# Patient Record
Sex: Female | Born: 1965 | Race: White | Hispanic: No | Marital: Married | Smoking: Current every day smoker
Health system: Southern US, Community
[De-identification: ages and names within clinical notes are randomized; demographics above are authoritative.]

## PROBLEM LIST (undated history)

## (undated) DIAGNOSIS — F419 Anxiety disorder, unspecified: Secondary | ICD-10-CM

## (undated) DIAGNOSIS — M5136 Other intervertebral disc degeneration, lumbar region: Secondary | ICD-10-CM

## (undated) DIAGNOSIS — R011 Cardiac murmur, unspecified: Secondary | ICD-10-CM

## (undated) DIAGNOSIS — F32A Depression, unspecified: Secondary | ICD-10-CM

## (undated) DIAGNOSIS — E7800 Pure hypercholesterolemia, unspecified: Secondary | ICD-10-CM

## (undated) DIAGNOSIS — G8929 Other chronic pain: Secondary | ICD-10-CM

## (undated) DIAGNOSIS — M5030 Other cervical disc degeneration, unspecified cervical region: Secondary | ICD-10-CM

## (undated) DIAGNOSIS — M545 Low back pain, unspecified: Secondary | ICD-10-CM

## (undated) DIAGNOSIS — F329 Major depressive disorder, single episode, unspecified: Secondary | ICD-10-CM

## (undated) DIAGNOSIS — E039 Hypothyroidism, unspecified: Secondary | ICD-10-CM

## (undated) DIAGNOSIS — F319 Bipolar disorder, unspecified: Secondary | ICD-10-CM

## (undated) DIAGNOSIS — M51369 Other intervertebral disc degeneration, lumbar region without mention of lumbar back pain or lower extremity pain: Secondary | ICD-10-CM

## (undated) DIAGNOSIS — I10 Essential (primary) hypertension: Secondary | ICD-10-CM

## (undated) HISTORY — PX: CARPAL TUNNEL RELEASE: SHX101

## (undated) HISTORY — DX: Hypothyroidism, unspecified: E03.9

## (undated) HISTORY — PX: LIPOMA EXCISION: SHX5283

---

## 1997-06-30 ENCOUNTER — Ambulatory Visit (HOSPITAL_COMMUNITY): Admission: RE | Admit: 1997-06-30 | Discharge: 1997-06-30 | Payer: Self-pay | Admitting: Obstetrics & Gynecology

## 1997-09-16 ENCOUNTER — Encounter: Admission: RE | Admit: 1997-09-16 | Discharge: 1997-12-15 | Payer: Self-pay | Admitting: Obstetrics and Gynecology

## 1997-10-14 ENCOUNTER — Other Ambulatory Visit: Admission: RE | Admit: 1997-10-14 | Discharge: 1997-10-14 | Payer: Self-pay | Admitting: Obstetrics and Gynecology

## 1997-10-27 ENCOUNTER — Ambulatory Visit (HOSPITAL_COMMUNITY): Admission: RE | Admit: 1997-10-27 | Discharge: 1997-10-27 | Payer: Self-pay | Admitting: Obstetrics & Gynecology

## 1997-12-01 ENCOUNTER — Inpatient Hospital Stay (HOSPITAL_COMMUNITY): Admission: AD | Admit: 1997-12-01 | Discharge: 1997-12-01 | Payer: Self-pay | Admitting: Obstetrics & Gynecology

## 1997-12-19 ENCOUNTER — Ambulatory Visit (HOSPITAL_COMMUNITY): Admission: RE | Admit: 1997-12-19 | Discharge: 1997-12-19 | Payer: Self-pay | Admitting: Obstetrics and Gynecology

## 1997-12-20 ENCOUNTER — Observation Stay (HOSPITAL_COMMUNITY): Admission: AD | Admit: 1997-12-20 | Discharge: 1997-12-21 | Payer: Self-pay | Admitting: Obstetrics and Gynecology

## 1997-12-26 ENCOUNTER — Inpatient Hospital Stay (HOSPITAL_COMMUNITY): Admission: AD | Admit: 1997-12-26 | Discharge: 1997-12-28 | Payer: Self-pay | Admitting: Obstetrics and Gynecology

## 1998-12-24 ENCOUNTER — Emergency Department (HOSPITAL_COMMUNITY): Admission: EM | Admit: 1998-12-24 | Discharge: 1998-12-24 | Payer: Self-pay | Admitting: Emergency Medicine

## 1998-12-24 ENCOUNTER — Encounter: Payer: Self-pay | Admitting: Emergency Medicine

## 1999-04-12 ENCOUNTER — Encounter: Payer: Self-pay | Admitting: Emergency Medicine

## 1999-04-12 ENCOUNTER — Emergency Department (HOSPITAL_COMMUNITY): Admission: EM | Admit: 1999-04-12 | Discharge: 1999-04-12 | Payer: Self-pay | Admitting: Emergency Medicine

## 1999-04-21 ENCOUNTER — Other Ambulatory Visit: Admission: RE | Admit: 1999-04-21 | Discharge: 1999-05-10 | Payer: Self-pay | Admitting: *Deleted

## 1999-07-06 ENCOUNTER — Ambulatory Visit (HOSPITAL_COMMUNITY): Admission: RE | Admit: 1999-07-06 | Discharge: 1999-07-06 | Payer: Self-pay | Admitting: Family Medicine

## 1999-07-07 ENCOUNTER — Encounter: Payer: Self-pay | Admitting: Family Medicine

## 1999-09-07 ENCOUNTER — Emergency Department (HOSPITAL_COMMUNITY): Admission: EM | Admit: 1999-09-07 | Discharge: 1999-09-07 | Payer: Self-pay | Admitting: Emergency Medicine

## 2000-07-27 ENCOUNTER — Encounter: Admission: RE | Admit: 2000-07-27 | Discharge: 2000-07-27 | Payer: Self-pay | Admitting: Orthopedic Surgery

## 2000-08-22 ENCOUNTER — Encounter: Admission: RE | Admit: 2000-08-22 | Discharge: 2000-08-22 | Payer: Self-pay | Admitting: Family Medicine

## 2000-08-22 ENCOUNTER — Encounter: Payer: Self-pay | Admitting: Family Medicine

## 2000-09-09 ENCOUNTER — Emergency Department (HOSPITAL_COMMUNITY): Admission: EM | Admit: 2000-09-09 | Discharge: 2000-09-09 | Payer: Self-pay | Admitting: Emergency Medicine

## 2000-10-04 ENCOUNTER — Encounter: Admission: RE | Admit: 2000-10-04 | Discharge: 2000-10-17 | Payer: Self-pay | Admitting: Family Medicine

## 2001-01-19 ENCOUNTER — Emergency Department (HOSPITAL_COMMUNITY): Admission: EM | Admit: 2001-01-19 | Discharge: 2001-01-19 | Payer: Self-pay | Admitting: Emergency Medicine

## 2001-06-12 ENCOUNTER — Encounter: Admission: RE | Admit: 2001-06-12 | Discharge: 2001-06-12 | Payer: Self-pay | Admitting: Family Medicine

## 2001-06-12 ENCOUNTER — Encounter: Payer: Self-pay | Admitting: Family Medicine

## 2001-07-16 ENCOUNTER — Encounter: Payer: Self-pay | Admitting: Emergency Medicine

## 2001-07-16 ENCOUNTER — Emergency Department (HOSPITAL_COMMUNITY): Admission: EM | Admit: 2001-07-16 | Discharge: 2001-07-16 | Payer: Self-pay | Admitting: Emergency Medicine

## 2001-10-23 ENCOUNTER — Ambulatory Visit (HOSPITAL_COMMUNITY): Admission: RE | Admit: 2001-10-23 | Discharge: 2001-10-23 | Payer: Self-pay | Admitting: *Deleted

## 2001-10-31 ENCOUNTER — Emergency Department (HOSPITAL_COMMUNITY): Admission: EM | Admit: 2001-10-31 | Discharge: 2001-10-31 | Payer: Self-pay | Admitting: Emergency Medicine

## 2001-10-31 ENCOUNTER — Encounter: Payer: Self-pay | Admitting: Emergency Medicine

## 2002-01-21 ENCOUNTER — Emergency Department (HOSPITAL_COMMUNITY): Admission: EM | Admit: 2002-01-21 | Discharge: 2002-01-22 | Payer: Self-pay

## 2002-01-21 ENCOUNTER — Emergency Department (HOSPITAL_COMMUNITY): Admission: EM | Admit: 2002-01-21 | Discharge: 2002-01-21 | Payer: Self-pay | Admitting: Emergency Medicine

## 2002-01-31 ENCOUNTER — Encounter: Payer: Self-pay | Admitting: Physical Medicine and Rehabilitation

## 2002-01-31 ENCOUNTER — Encounter
Admission: RE | Admit: 2002-01-31 | Discharge: 2002-01-31 | Payer: Self-pay | Admitting: Physical Medicine and Rehabilitation

## 2002-02-12 ENCOUNTER — Encounter (INDEPENDENT_AMBULATORY_CARE_PROVIDER_SITE_OTHER): Payer: Self-pay | Admitting: Cardiology

## 2002-02-12 ENCOUNTER — Ambulatory Visit (HOSPITAL_COMMUNITY): Admission: RE | Admit: 2002-02-12 | Discharge: 2002-02-12 | Payer: Self-pay | Admitting: Family Medicine

## 2002-02-22 ENCOUNTER — Encounter: Payer: Self-pay | Admitting: Physical Medicine and Rehabilitation

## 2002-02-22 ENCOUNTER — Encounter
Admission: RE | Admit: 2002-02-22 | Discharge: 2002-02-22 | Payer: Self-pay | Admitting: Physical Medicine and Rehabilitation

## 2002-09-27 ENCOUNTER — Emergency Department (HOSPITAL_COMMUNITY): Admission: EM | Admit: 2002-09-27 | Discharge: 2002-09-27 | Payer: Self-pay

## 2003-04-09 ENCOUNTER — Other Ambulatory Visit: Admission: RE | Admit: 2003-04-09 | Discharge: 2003-04-09 | Payer: Self-pay | Admitting: Obstetrics and Gynecology

## 2004-01-20 ENCOUNTER — Emergency Department (HOSPITAL_COMMUNITY): Admission: EM | Admit: 2004-01-20 | Discharge: 2004-01-20 | Payer: Self-pay | Admitting: Emergency Medicine

## 2004-09-28 ENCOUNTER — Emergency Department (HOSPITAL_COMMUNITY): Admission: EM | Admit: 2004-09-28 | Discharge: 2004-09-28 | Payer: Self-pay | Admitting: Family Medicine

## 2005-02-11 ENCOUNTER — Encounter: Admission: RE | Admit: 2005-02-11 | Discharge: 2005-02-11 | Payer: Self-pay | Admitting: Family Medicine

## 2006-02-03 ENCOUNTER — Encounter: Admission: RE | Admit: 2006-02-03 | Discharge: 2006-02-03 | Payer: Self-pay | Admitting: Surgery

## 2006-03-03 ENCOUNTER — Encounter: Admission: RE | Admit: 2006-03-03 | Discharge: 2006-03-03 | Payer: Self-pay | Admitting: Surgery

## 2006-03-09 ENCOUNTER — Ambulatory Visit (HOSPITAL_BASED_OUTPATIENT_CLINIC_OR_DEPARTMENT_OTHER): Admission: RE | Admit: 2006-03-09 | Discharge: 2006-03-09 | Payer: Self-pay | Admitting: Surgery

## 2006-03-09 ENCOUNTER — Encounter (INDEPENDENT_AMBULATORY_CARE_PROVIDER_SITE_OTHER): Payer: Self-pay | Admitting: Specialist

## 2006-05-24 ENCOUNTER — Emergency Department (HOSPITAL_COMMUNITY): Admission: EM | Admit: 2006-05-24 | Discharge: 2006-05-24 | Payer: Self-pay | Admitting: Emergency Medicine

## 2007-12-20 ENCOUNTER — Emergency Department (HOSPITAL_COMMUNITY): Admission: EM | Admit: 2007-12-20 | Discharge: 2007-12-20 | Payer: Self-pay | Admitting: Emergency Medicine

## 2007-12-27 ENCOUNTER — Emergency Department (HOSPITAL_COMMUNITY): Admission: EM | Admit: 2007-12-27 | Discharge: 2007-12-27 | Payer: Self-pay | Admitting: Family Medicine

## 2008-01-20 ENCOUNTER — Emergency Department (HOSPITAL_COMMUNITY): Admission: EM | Admit: 2008-01-20 | Discharge: 2008-01-21 | Payer: Self-pay | Admitting: Emergency Medicine

## 2008-04-03 ENCOUNTER — Ambulatory Visit: Payer: Self-pay | Admitting: Cardiology

## 2008-04-03 LAB — CONVERTED CEMR LAB
ALT: 11 units/L (ref 0–35)
Albumin: 4.1 g/dL (ref 3.5–5.2)
Amylase: 51 units/L (ref 27–131)
Lipase: 15 units/L (ref 11.0–59.0)
Total Bilirubin: 0.4 mg/dL (ref 0.3–1.2)

## 2008-04-09 ENCOUNTER — Encounter: Admission: RE | Admit: 2008-04-09 | Discharge: 2008-04-09 | Payer: Self-pay | Admitting: Family Medicine

## 2010-03-13 ENCOUNTER — Emergency Department (HOSPITAL_COMMUNITY): Admission: EM | Admit: 2010-03-13 | Discharge: 2010-03-14 | Payer: Self-pay | Admitting: Emergency Medicine

## 2010-04-26 ENCOUNTER — Emergency Department (HOSPITAL_COMMUNITY)
Admission: EM | Admit: 2010-04-26 | Discharge: 2010-04-26 | Payer: Self-pay | Source: Home / Self Care | Admitting: Emergency Medicine

## 2010-05-09 ENCOUNTER — Emergency Department (HOSPITAL_COMMUNITY)
Admission: EM | Admit: 2010-05-09 | Discharge: 2010-05-09 | Payer: Self-pay | Source: Home / Self Care | Admitting: Emergency Medicine

## 2010-05-17 LAB — POCT CARDIAC MARKERS
CKMB, poc: <1 ng/mL — ABNORMAL LOW (ref 1.0–8.0)
Myoglobin, poc: 53.3 ng/mL (ref 12–200)
Troponin i, poc: <0.05 ng/mL (ref 0.00–0.09)

## 2010-05-17 LAB — CBC
HCT: 34.7 % — ABNORMAL LOW (ref 36.0–46.0)
Hemoglobin: 11.4 g/dL — ABNORMAL LOW (ref 12.0–15.0)
MCH: 30.2 pg (ref 26.0–34.0)
MCHC: 32.9 g/dL (ref 30.0–36.0)
MCV: 91.8 fL (ref 78.0–100.0)
Platelets: 289 10*3/uL (ref 150–400)
RBC: 3.78 MIL/uL — ABNORMAL LOW (ref 3.87–5.11)
RDW: 14 % (ref 11.5–15.5)
WBC: 7.1 10*3/uL (ref 4.0–10.5)

## 2010-05-17 LAB — POCT PREGNANCY, URINE: Preg Test, Ur: NEGATIVE

## 2010-05-17 LAB — URINALYSIS, ROUTINE W REFLEX MICROSCOPIC
Bilirubin Urine: NEGATIVE
Ketones, ur: NEGATIVE mg/dL
Nitrite: NEGATIVE
Protein, ur: NEGATIVE mg/dL
Specific Gravity, Urine: 1.004 — ABNORMAL LOW (ref 1.005–1.030)
Urine Glucose, Fasting: NEGATIVE mg/dL
Urobilinogen, UA: 0.2 mg/dL (ref 0.0–1.0)
pH: 6.5 (ref 5.0–8.0)

## 2010-05-17 LAB — COMPREHENSIVE METABOLIC PANEL
ALT: 14 U/L (ref 0–35)
AST: 19 U/L (ref 0–37)
Albumin: 3.3 g/dL — ABNORMAL LOW (ref 3.5–5.2)
Alkaline Phosphatase: 46 U/L (ref 39–117)
BUN: 7 mg/dL (ref 6–23)
CO2: 27 mEq/L (ref 19–32)
Calcium: 8.3 mg/dL — ABNORMAL LOW (ref 8.4–10.5)
Chloride: 102 mEq/L (ref 96–112)
Creatinine, Ser: 0.7 mg/dL (ref 0.4–1.2)
GFR calc Af Amer: >60 mL/min (ref >60)
GFR calc non Af Amer: >60 mL/min (ref >60)
Glucose, Bld: 108 mg/dL — ABNORMAL HIGH (ref 70–99)
Potassium: 3.5 mEq/L (ref 3.5–5.1)
Sodium: 135 mEq/L (ref 135–145)
Total Bilirubin: 0.3 mg/dL (ref 0.3–1.2)
Total Protein: 6.1 g/dL (ref 6.0–8.3)

## 2010-05-17 LAB — URINE MICROSCOPIC-ADD ON

## 2010-05-17 LAB — DIFFERENTIAL
Basophils Absolute: 0 10*3/uL (ref 0.0–0.1)
Basophils Relative: 0 % (ref 0–1)
Eosinophils Absolute: 0.2 10*3/uL (ref 0.0–0.7)
Eosinophils Relative: 3 % (ref 0–5)
Lymphocytes Relative: 23 % (ref 12–46)
Lymphs Abs: 1.6 10*3/uL (ref 0.7–4.0)
Monocytes Absolute: 0.4 10*3/uL (ref 0.1–1.0)
Monocytes Relative: 6 % (ref 3–12)
Neutro Abs: 4.8 10*3/uL (ref 1.7–7.7)
Neutrophils Relative %: 68 % (ref 43–77)

## 2010-05-17 LAB — LIPASE, BLOOD: Lipase: 14 U/L (ref 11–59)

## 2010-05-23 ENCOUNTER — Encounter: Payer: Self-pay | Admitting: Cardiology

## 2010-07-14 LAB — COMPREHENSIVE METABOLIC PANEL
ALT: 10 U/L (ref 0–35)
AST: 16 U/L (ref 0–37)
Albumin: 3.5 g/dL (ref 3.5–5.2)
CO2: 32 mEq/L (ref 19–32)
Calcium: 8.4 mg/dL (ref 8.4–10.5)
GFR calc Af Amer: >60 mL/min (ref >60)
Sodium: 138 mEq/L (ref 135–145)
Total Protein: 6.6 g/dL (ref 6.0–8.3)

## 2010-07-14 LAB — CBC
Hemoglobin: 11.4 g/dL — ABNORMAL LOW (ref 12.0–15.0)
MCHC: 32.9 g/dL (ref 30.0–36.0)
Platelets: 274 10*3/uL (ref 150–400)
RDW: 13.5 % (ref 11.5–15.5)

## 2010-07-14 LAB — URINALYSIS, ROUTINE W REFLEX MICROSCOPIC
Glucose, UA: NEGATIVE mg/dL
Ketones, ur: NEGATIVE mg/dL
Protein, ur: NEGATIVE mg/dL
pH: 8.5 — ABNORMAL HIGH (ref 5.0–8.0)

## 2010-07-14 LAB — DIFFERENTIAL
Eosinophils Absolute: 0.4 10*3/uL (ref 0.0–0.7)
Eosinophils Relative: 5 % (ref 0–5)
Lymphocytes Relative: 28 % (ref 12–46)
Lymphs Abs: 2 10*3/uL (ref 0.7–4.0)
Monocytes Absolute: 0.6 10*3/uL (ref 0.1–1.0)
Monocytes Relative: 9 % (ref 3–12)

## 2010-07-14 LAB — URINE MICROSCOPIC-ADD ON

## 2010-07-14 LAB — LIPASE, BLOOD: Lipase: 17 U/L (ref 11–59)

## 2010-07-14 LAB — POCT CARDIAC MARKERS
CKMB, poc: 1.7 ng/mL (ref 1.0–8.0)
Myoglobin, poc: 60.2 ng/mL (ref 12–200)

## 2010-09-14 NOTE — Assessment & Plan Note (Signed)
[facility]                            CARDIOLOGY OFFICE NOTE   Jeanne Leach, Jeanne Leach                      MRN:          [id number]  DATE:04/03/2008                            DOB:          December 26, 1965    Jeanne Leach is a 45 year old female with no prior cardiac history whom I  am asked to evaluate for chest pain and hypertension.  Over the past  year, Jeanne Leach has complained of intermittent epigastric pain.  The  pain radiates to her neck.  She describes it as a pressure.  It is not  pleuritic or positional.  It is not exertional.  She typically notices  it more after eating.  It lasts anywhere from 5-20 minutes and resolves  spontaneously.  There is associated nausea and diaphoresis, but there is  no shortness of breath.  These occur every 3-4 weeks and unpredictable.  She apparently was at [facility] visiting a friend approximately 4  months ago and was having the episodes.  She wanted to be seen during  the episodes and apparently was admitted there.  She states that she had  a stress test and a study with dye and was told that her heart was  fine.  She was asked to follow up with her primary care physician.  She  was seen recently by Dr. Daphine Deutscher and we were asked to further evaluate.  Note, she does not have exertional chest pain nor does she have dyspnea  on exertion, orthopnea, PND, pedal edema, palpitations, presyncope, or  syncope.  She has had no pain in the past 3 weeks.   MEDICATIONS:  1. Diovan HCT 160/12.5 mg p.o. daily.  2. Synthroid 150 mcg p.o. daily.  3. Symbyax 12/50 daily.  4. Seroquel 25 mg p.o. b.i.d.  5. Xanax 2 mg daily p.o. nightly.  6. Lasix 20 mg p.o. daily.  7. She also uses albuterol, Percocet, and Flexeril as needed.   ALLERGIES:  She has no known drug allergies.   SOCIAL HISTORY:  She does smoke.  She does not consume alcohol.  She is  married.   FAMILY HISTORY:  Negative for coronary artery disease.   PAST  MEDICAL HISTORY:  Significant for hypertension.  There is no  diabetes mellitus or hyperlipidemia.  She has a history of chronic low  back pain as well as bipolar disorder.  She has a history of  gastroesophageal disease.  She has had a prior C-section and carpal  tunnel surgery.  She had gestational diabetes.  She has a history of  asthma.  She has hypothyroidism after being treated for hyperthyroidism  in the past.  There is also history of anxiety/depression.   REVIEW OF SYSTEMS:  She denies any headaches, fevers, or chills.  There  is no productive cough or hemoptysis.  There is no dysphagia,  odynophagia, melena, or hematochezia.  There is no dysuria or hematuria.  There are no rashes or seizure activity.  There is no orthopnea, PND, or  pedal edema.  She has not had acholic stool.  Remaining of systems are  negative.  PHYSICAL EXAMINATION:  VITAL SIGNS:  Today, shows a blood pressure of  116/80 and pulse is 64.  She weighs 202 pounds.  GENERAL:  She is well developed and somewhat obese.  She is no acute  stress at present.  SKIN:  Warm and dry.  She is not acutely depressed.  There is no  peripheral clubbing.  BACK:  Normal.  HEENT:  Normal with normal eyelids.  NECK:  Supple with a normal upstroke bilaterally.  No bruits noted.  There is no jugular venous distention and no thyromegaly noted.  CHEST:  Clear to auscultation.  There is no expansion.  CARDIOVASCULAR:  Regular rhythm.  Normal S1 and S2.  There is a soft 1/6  systolic ejection murmur at the left sternal border.  There is no S3-S4.  ABDOMEN:  Minimal tenderness in the epigastric area.  There is no  rebound or guarding.  I cannot appreciate hepatosplenomegaly.  There are  no masses palpated.  There is no bruit.  She has 2+ femoral pulses  bilaterally.  No bruits.  EXTREMITIES:  No edema.  Palpated no cords.  She has 2+ dorsalis pedis  pulses bilaterally.  NEUROLOGIC:  Grossly intact.   Her electrocardiogram today  shows a sinus rhythm at a rate of 67.  The  axis is normal.  There are minor nonspecific ST changes.   DIAGNOSES:  1. Chest pain/epigastric pain - etiology of this is unclear to me.      Some of the symptoms sound concerning whereas others are much less      typical.  She has no symptoms with exertion and these typically      occur after eating.  I wonder whether she may have cholelithiasis.      We will schedule to her to have a gallbladder ultrasound as well as      amylase, lipase, and liver functions.  Also note, she had an      extensive workup at [facility] for these symptoms      approximately 4 months ago by her report.  I do not have those      reports available, but she states that her stress test was normal,      and she also had a dye test that was normal.  We will obtain all of      those records from [facility] and review these.  We will have      her return in 2 weeks and we will make further decisions based on      those results plus the gallbladder ultrasound and liver      function/amylase, lipase.  2. Hypertension - she will continue on her present dose of medications      as her blood pressure is well controlled.  3. Hypothyroidism - she will continue her Synthroid.  4. History of bipolar disorder - she will continue her present      medications.  5. Gastroesophageal disease - she does take Prevacid for this.  She      may need a gastrointestinal evaluation in the future if the above      workup is negative.  6. Tobacco abuse - we discussed importance of discontinuing this for      between 3-10 minutes.     Madolyn Frieze Jens Som, MD, [facility]  Electronically Signed    BSC/MedQ  DD: 04/03/2008  DT: 04/04/2008  Job #: [id number]   cc:   Tanya D. Daphine Deutscher, M.D.

## 2010-09-17 NOTE — Op Note (Signed)
NAMEPAHOUA, Jeanne Leach               ACCOUNT NO.:  [id number]   MEDICAL RECORD NO.:  [id number]          PATIENT TYPE:  AMB   LOCATION:  [facility]                         FACILITY:  [facility]   PHYSICIAN:  Thomas A. Cornett, M.D.DATE OF BIRTH:  January 20, 1966   DATE OF PROCEDURE:  03/09/2006  DATE OF DISCHARGE:                                 OPERATIVE REPORT   PREOPERATIVE DIAGNOSIS:  Left chest wall mass.   POSTOPERATIVE DIAGNOSIS:  Left chest wall mass.   PROCEDURE:  Excision of left chest wall mass.   SURGEON:  Maisie Fus A. Cornett, M.D.   ANESTHESIA:  LMA with 20 mL of 0.25% Marcaine plain.   ESTIMATED BLOOD LOSS:  20 mL.   SPECIMEN:  A 3 x 3 x 3 cm well-encapsulated, cleared nodule found just below  the pectoralis minor muscle, sent to pathology for evaluation.   DRAINS:  None.   INDICATIONS FOR PROCEDURE:  The patient is a 40-year female who has had a  longstanding history of a slowly-growing left chest wall mass.  Mammograms  were done about a year ago.  These were negative.  The area grew slowly and  she was referred Dr. Sheran Luz for consultation for this.  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  It was less likely  a sarcoma.  This was freely mobile.  She presents today for excision of  this.   DESCRIPTION OF PROCEDURE:  The patient was brought to the operating room and  placed supine.  After induction of LMA anesthesia, the left upper chest was  prepped and draped in a sterile fashion.  The area was palpated and was  about 4 fingerbreadths below the left clavicle.  A transverse incision was  made.  Dissection was carried down until I encountered the pectoralis major  muscle.  I opened the fascia and then split the fibers bluntly with my  fingers until I got down to the pectoralis minor muscle.  The mass was  easily visible. It was not fixed to any underlying structures and  actually  seemed to be separate from the pectoralis minor muscle.  I scored the  pectoralis minor muscle circumferentially.  We were up against the pectoral  nerves, and this seemed to be a neck area.  I dissected carefully around  this mass trying to preserve some of muscle tissue overlying it.  It was  extremely well encapsulated and did not appear to be a part of the  pectoralis minor muscle but separate from that.  We were able to dissect  with about a centimeter margin around this area.  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  I went ahead  and got a good margin and excised the mass and its entirety.  This was sent  to pathology for evaluation.  The operative bed was dry with good  hemostasis.  A single stitch was placed in the muscle due to some oozing in  the pectoralis minor muscle.  At this point in time, I reapproximated the of  the fibers pectoralis major was 3-0 Vicryl.  Monocryl  4-0 was then used to close the skin edges.  All final counts of sponge,  needle and instruments were found to be correct for this portion of the  case.  Sterile dressings were applied.  All final counts were correct x2.  The patient was taken to recovery in satisfactory condition.      Thomas A. Cornett, M.D.  Electronically Signed     TAC/MEDQ  D:  03/09/2006  T:  03/10/2006  Job:  [id number]   cc:   Caralyn Guile. Ethelene Hal, M.D.  Fax: [phone]

## 2011-07-25 ENCOUNTER — Ambulatory Visit (INDEPENDENT_AMBULATORY_CARE_PROVIDER_SITE_OTHER): Payer: Medicare Other | Admitting: Obstetrics and Gynecology

## 2011-07-25 DIAGNOSIS — N920 Excessive and frequent menstruation with regular cycle: Secondary | ICD-10-CM

## 2011-07-25 DIAGNOSIS — N949 Unspecified condition associated with female genital organs and menstrual cycle: Secondary | ICD-10-CM

## 2011-08-09 ENCOUNTER — Telehealth: Payer: Self-pay | Admitting: Obstetrics and Gynecology

## 2011-08-15 ENCOUNTER — Telehealth: Payer: Self-pay | Admitting: Obstetrics and Gynecology

## 2011-08-15 NOTE — Telephone Encounter (Signed)
Lm on pt's vm to call back rgd msg. bt cma  

## 2011-08-15 NOTE — Telephone Encounter (Signed)
Spoke with pt rgd msg. Pt stated that she wanted something for pain. Per avs approved rx for ultram 50mg  1 to 2 po prn for pain dip # 30 with 1 refill. Called into [place] [phone]. Pt aware of rx and voice understanding. bt cma

## 2011-09-02 ENCOUNTER — Other Ambulatory Visit: Payer: Self-pay | Admitting: Obstetrics and Gynecology

## 2011-09-02 DIAGNOSIS — N8500 Endometrial hyperplasia, unspecified: Secondary | ICD-10-CM

## 2011-09-05 ENCOUNTER — Encounter: Payer: Medicare Other | Admitting: Obstetrics and Gynecology

## 2011-09-05 ENCOUNTER — Ambulatory Visit: Payer: Medicare Other

## 2011-09-05 ENCOUNTER — Other Ambulatory Visit: Payer: Medicare Other

## 2011-11-24 ENCOUNTER — Emergency Department (HOSPITAL_COMMUNITY): Payer: Medicare Other

## 2011-11-24 ENCOUNTER — Emergency Department (HOSPITAL_COMMUNITY)
Admission: EM | Admit: 2011-11-24 | Discharge: 2011-11-24 | Disposition: A | Discharge status: Home / Self Care | Payer: Medicare Other | Attending: Emergency Medicine | Admitting: Emergency Medicine

## 2011-11-24 ENCOUNTER — Encounter (HOSPITAL_COMMUNITY): Payer: Self-pay | Admitting: *Deleted

## 2011-11-24 DIAGNOSIS — R51 Headache: Secondary | ICD-10-CM | POA: Insufficient documentation

## 2011-11-24 DIAGNOSIS — K089 Disorder of teeth and supporting structures, unspecified: Secondary | ICD-10-CM | POA: Insufficient documentation

## 2011-11-24 DIAGNOSIS — I10 Essential (primary) hypertension: Secondary | ICD-10-CM | POA: Insufficient documentation

## 2011-11-24 DIAGNOSIS — M542 Cervicalgia: Secondary | ICD-10-CM | POA: Insufficient documentation

## 2011-11-24 DIAGNOSIS — H9209 Otalgia, unspecified ear: Secondary | ICD-10-CM | POA: Insufficient documentation

## 2011-11-24 DIAGNOSIS — E119 Type 2 diabetes mellitus without complications: Secondary | ICD-10-CM | POA: Insufficient documentation

## 2011-11-24 DIAGNOSIS — Z79899 Other long term (current) drug therapy: Secondary | ICD-10-CM | POA: Insufficient documentation

## 2011-11-24 HISTORY — DX: Essential (primary) hypertension: I10

## 2011-11-24 MED ORDER — ONDANSETRON HCL 4 MG/2ML IJ SOLN
4.0000 mg | Freq: Once | INTRAMUSCULAR | Status: AC
  Administered 2011-11-24: 4 mg via INTRAVENOUS
  Filled 2011-11-24: qty 2

## 2011-11-24 MED ORDER — FENTANYL CITRATE 0.05 MG/ML IJ SOLN
100.0000 ug | Freq: Once | INTRAMUSCULAR | Status: AC
  Administered 2011-11-24: 100 ug via INTRAVENOUS
  Filled 2011-11-24: qty 2

## 2011-11-24 MED ORDER — OXYCODONE-ACETAMINOPHEN 7.5-325 MG PO TABS: 1.0000 | ORAL_TABLET | ORAL | Status: AC | PRN

## 2011-11-24 MED ORDER — CYCLOBENZAPRINE HCL 10 MG PO TABS: 10.0000 mg | ORAL_TABLET | Freq: Two times a day (BID) | ORAL | Status: AC | PRN

## 2011-11-24 MED ORDER — CLONIDINE HCL 0.1 MG PO TABS
0.1000 mg | ORAL_TABLET | Freq: Once | ORAL | Status: DC
  Filled 2011-11-24: qty 1

## 2011-11-24 MED ORDER — SODIUM CHLORIDE 0.9 % IV SOLN
Freq: Once | INTRAVENOUS | Status: AC
  Administered 2011-11-24: 22:00:00 via INTRAVENOUS

## 2011-11-24 MED ORDER — OXYCODONE-ACETAMINOPHEN 5-325 MG PO TABS
2.0000 | ORAL_TABLET | Freq: Once | ORAL | Status: AC
  Administered 2011-11-24: 2 via ORAL
  Filled 2011-11-24: qty 2

## 2011-11-24 NOTE — ED Notes (Signed)
Pt is back in room from radiology 

## 2011-11-24 NOTE — ED Notes (Signed)
Patient was sitting outside the ED and did not hear her name called.  I apologized to patient, patient transferred immediately to an exam room.

## 2011-11-24 NOTE — ED Notes (Signed)
Headache and lt sided face and head pain with a stiff neck for 4 days.  No known injury

## 2011-11-24 NOTE — ED Provider Notes (Addendum)
History     CSN: [id number]  Arrival date & time 11/24/11  1556   First MD Initiated Contact with Patient 11/24/11 2006      Chief Complaint  Patient presents with  . Neck Pain    left posterior neck pain  . Otalgia    left ear pain     ) HPI Patient with left-sided headache which started yesterday.  This morning woke up with left-sided neck pain.  Denies fever chills.  Pain is worse when she moves her neck especially turning to the left.  Patient also has hypertension here in the emergency department.  She states she took her medication for high blood pressure this morning is normal.  Patient's had no vomiting.  Patient has no previous history of migraines.  Has had no ear drainage.  Patient also complains of a toothache which is being treated for by her dentist. Past Medical History  Diagnosis Date  . Hypertension   . Diabetes mellitus     History reviewed. No pertinent past surgical history.  No family history on file.  History  Substance Use Topics  . Smoking status: Current Everyday Smoker  . Smokeless tobacco: Not on file  . Alcohol Use: No    OB History    Grav Para Term Preterm Abortions TAB SAB Ect Mult Living                  Review of Systems  All other systems reviewed and are negative.    Allergies  Review of patient's allergies indicates no known allergies.  Home Medications   Current Outpatient Rx  Name Route Sig Dispense Refill  . ALPRAZOLAM 2 MG PO TABS Oral Take 2 mg by mouth 4 (four) times daily.    Marland Kitchen OLANZAPINE-FLUOXETINE HCL 6-50 MG PO CAPS Oral Take 1 capsule by mouth every evening.    Marland Kitchen VALSARTAN 160 MG PO TABS Oral Take 160 mg by mouth daily.    . CYCLOBENZAPRINE HCL 10 MG PO TABS Oral Take 1 tablet (10 mg total) by mouth 2 (two) times daily as needed for muscle spasms. 20 tablet 0  . OXYCODONE-ACETAMINOPHEN 7.5-325 MG PO TABS Oral Take 1 tablet by mouth every 4 (four) hours as needed for pain. 30 tablet 0    BP 138/99  Pulse 85   Temp 99.6 F (37.6 C) (Oral)  Resp 18  SpO2 98%  LMP 11/10/2011  Physical Exam  Nursing note and vitals reviewed. Constitutional: She is oriented to person, place, and time. She appears well-developed. No distress.  HENT:  Head: Normocephalic and atraumatic.  Right Ear: Tympanic membrane and external ear normal. No drainage, swelling or tenderness.  Left Ear: Tympanic membrane and external ear normal. No drainage, swelling or tenderness.  Eyes: Pupils are equal, round, and reactive to light.  Neck: Normal range of motion.    Cardiovascular: Normal rate and intact distal pulses.   Pulmonary/Chest: No respiratory distress.  Abdominal: Normal appearance. She exhibits no distension.  Musculoskeletal: Normal range of motion.  Neurological: She is alert and oriented to person, place, and time. No cranial nerve deficit.  Skin: Skin is warm and dry. No rash noted.  Psychiatric: She has a normal mood and affect. Her behavior is normal.    ED Course  Procedures (including critical care time)  Scheduled Meds:   . sodium chloride   Intravenous Once  . cloNIDine  0.1 mg Oral Once  . fentaNYL  100 mcg Intravenous Once  . fentaNYL  100 mcg Intravenous Once  . ondansetron  4 mg Intravenous Once  . oxyCODONE-acetaminophen  2 tablet Oral Once  . DISCONTD: cloNIDine  0.1 mg Oral Once   Continuous Infusions:  PRN Meds:.  Labs Reviewed - No data to display Ct Head Wo Contrast  11/24/2011  **ADDENDUM** CREATED: 11/24/2011 22:45:46  There was an error in the Auto-Text insertion of the original dictation.  The addendum represents the accurate final interpretation of the study.  *RADIOLOGY REPORT*  Clinical Data:  Posterior headache and hypertension.  CT HEAD WITHOUT CONTRAST  Technique:  Contiguous axial images were obtained from the base of the skull through the vertex without contrast  Comparison:  None.  Findings:  The brain has a normal appearance without evidence for hemorrhage, acute  infarction, hydrocephalus, or mass lesion.  There is no extra axial fluid collection.  The skull and paranasal sinuses are normal.  IMPRESSION: Normal CT of the head without contrast.  **END ADDENDUM** SIGNED BY: Sherrine Maples T. Fredia Sorrow, M.D.   11/24/2011  *RADIOLOGY REPORT*  Clinical Data: Posterior headache and hypertension.  CT HEAD WITHOUT CONTRAST  Comparison:  01/21/2008  Findings:  There is no evidence of fracture or dislocation.  There is no evidence of arthropathy or other focal bone abnormality. Soft tissues are unremarkable.  IMPRESSION: Negative.  Original Report Authenticated By: Reola Calkins, M.D.     1. Cervical pain       MDM  After treatment in the ED the patient feels better and wants to go home.         Nelia Shi, MD 11/24/11 [id number]  Nelia Shi, MD 11/25/11 [phone]

## 2011-11-24 NOTE — ED Notes (Signed)
Pt states she started having left sided posterior neck pain, left ear pain, and pain in the back of her head for two days. pt states she took two vicodin today with mild relieve, pt states pain level is at a 10

## 2011-11-24 NOTE — ED Notes (Signed)
Reassesed pt pain/ pt states pain is at a 5

## 2011-11-24 NOTE — ED Notes (Signed)
Pt is currently in radiology. 

## 2011-12-06 ENCOUNTER — Encounter (HOSPITAL_COMMUNITY): Payer: Self-pay | Admitting: Emergency Medicine

## 2011-12-06 ENCOUNTER — Emergency Department (INDEPENDENT_AMBULATORY_CARE_PROVIDER_SITE_OTHER)
Admission: EM | Admit: 2011-12-06 | Discharge: 2011-12-06 | Disposition: A | Discharge status: Other Acute Inpatient Hospital | Payer: Medicare Other | Source: Home / Self Care | Attending: Emergency Medicine | Admitting: Emergency Medicine

## 2011-12-06 ENCOUNTER — Emergency Department (HOSPITAL_COMMUNITY)
Admission: EM | Admit: 2011-12-06 | Discharge: 2011-12-07 | Disposition: A | Discharge status: Home / Self Care | Payer: Medicare Other | Attending: Emergency Medicine | Admitting: Emergency Medicine

## 2011-12-06 ENCOUNTER — Encounter (HOSPITAL_COMMUNITY): Payer: Self-pay

## 2011-12-06 DIAGNOSIS — M436 Torticollis: Secondary | ICD-10-CM

## 2011-12-06 DIAGNOSIS — F17200 Nicotine dependence, unspecified, uncomplicated: Secondary | ICD-10-CM | POA: Insufficient documentation

## 2011-12-06 DIAGNOSIS — M5030 Other cervical disc degeneration, unspecified cervical region: Secondary | ICD-10-CM | POA: Insufficient documentation

## 2011-12-06 DIAGNOSIS — I10 Essential (primary) hypertension: Secondary | ICD-10-CM

## 2011-12-06 DIAGNOSIS — M542 Cervicalgia: Secondary | ICD-10-CM

## 2011-12-06 DIAGNOSIS — F319 Bipolar disorder, unspecified: Secondary | ICD-10-CM | POA: Insufficient documentation

## 2011-12-06 DIAGNOSIS — M51379 Other intervertebral disc degeneration, lumbosacral region without mention of lumbar back pain or lower extremity pain: Secondary | ICD-10-CM | POA: Insufficient documentation

## 2011-12-06 DIAGNOSIS — M5137 Other intervertebral disc degeneration, lumbosacral region: Secondary | ICD-10-CM | POA: Insufficient documentation

## 2011-12-06 HISTORY — DX: Other intervertebral disc degeneration, lumbar region without mention of lumbar back pain or lower extremity pain: M51.369

## 2011-12-06 HISTORY — DX: Other intervertebral disc degeneration, lumbar region: M51.36

## 2011-12-06 HISTORY — DX: Bipolar disorder, unspecified: F31.9

## 2011-12-06 HISTORY — DX: Other cervical disc degeneration, unspecified cervical region: M50.30

## 2011-12-06 MED ORDER — CLONIDINE HCL 0.1 MG PO TABS: 0.1000 mg | ORAL_TABLET | Freq: Every day | ORAL | Status: DC
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
## 2011-12-06 NOTE — ED Notes (Signed)
Neck pain for several weeks she has been seen here once for the same.  She ran out of pain med so she sent to ucc and was transferred down.  C/o pain she has called x2 since she came to the treatment room.  She has been there for 10 minutes

## 2011-12-06 NOTE — ED Notes (Signed)
Seen 7-25 in ED for pain in neck, no trauma; out of pain meds, continuing to have symptoms of pain and neck stiffness

## 2011-12-06 NOTE — ED Notes (Signed)
The pt and family have been making threats to the staff while i was out of the dept.  Upset over the wait and the pt has not been given pain med  Has not been seen by a doctor

## 2011-12-06 NOTE — ED Notes (Addendum)
PT. REPORTS PERSISTENT PAIN AT BACK OF NECK , LEFT EAR ACHE , AND PAIN AT BACK OF HEAD FOR SEVERAL WEEKS , SEEN HERE LAST July 25 , PRESCRIBED WITH PERCOCET AND FLEXERIL FOR NECK STRAIN WITH NO RELIEF, DENIES FEVER OR CHILLS. NO RECENT INJURY.

## 2011-12-06 NOTE — ED Notes (Signed)
HYPERTENSIVE AT TRIAGE.

## 2011-12-06 NOTE — ED Notes (Signed)
Pt updated on delays and wait time 

## 2011-12-06 NOTE — ED Notes (Signed)
Family member requesting to speak to the Magazine features editor.  Barbara Cower here he will speak to pt and family

## 2011-12-07 ENCOUNTER — Emergency Department (HOSPITAL_COMMUNITY): Payer: Medicare Other

## 2011-12-07 MED ORDER — PREDNISONE 10 MG PO TABS: 20.0000 mg | ORAL_TABLET | Freq: Two times a day (BID) | ORAL | Status: DC

## 2011-12-07 MED ORDER — OXYCODONE-ACETAMINOPHEN 5-325 MG PO TABS: 1.0000 | ORAL_TABLET | ORAL | Status: AC | PRN

## 2011-12-07 MED ORDER — HYDROMORPHONE HCL PF 1 MG/ML IJ SOLN
1.0000 mg | Freq: Once | INTRAMUSCULAR | Status: AC
  Administered 2011-12-07: 1 mg via INTRAVENOUS
  Filled 2011-12-07: qty 1

## 2011-12-07 MED ORDER — DIAZEPAM 5 MG/ML IJ SOLN
5.0000 mg | Freq: Once | INTRAMUSCULAR | Status: AC
  Administered 2011-12-07: 5 mg via INTRAVENOUS
  Filled 2011-12-07: qty 2

## 2011-12-07 MED ORDER — DIAZEPAM 10 MG PO TABS: 10.0000 mg | ORAL_TABLET | Freq: Four times a day (QID) | ORAL | Status: AC | PRN

## 2011-12-07 MED ORDER — PREDNISONE 20 MG PO TABS
60.0000 mg | ORAL_TABLET | Freq: Once | ORAL | Status: AC
  Administered 2011-12-07: 60 mg via ORAL
  Filled 2011-12-07: qty 3

## 2011-12-07 NOTE — ED Provider Notes (Signed)
Jeanne Leach is a 46 y.o. female who complains of ongoing, left neck pain, worse with movement for 2 weeks. She was evaluated here 11/24/2011 had a head scan. That was negative for intracranial bleeding or tumor. She takes pain medicines frequently. Her pain medicine doctor weaned her off Percocet about 2 weeks ago. She sees her primary care Dr. regularly, but he does not prescribe her pain medications. On exam, she is alert, anxious, but cooperative. Left neck is tender to palpation, and has decreased left lateral bending, secondary to pain. There is no midline tenderness of the posterior cervical spine. She is neurovascularly intact distally in the arms, and legs. Mouth has poor dentition, no trismus. No cervical adenopathy.  MDM: Chronic pain, with recent onset of neck pain. Suspect musculoskeletal cause of pain. Doubt spinal stenosis, spinal myelopathy, hypertensive urgency, discitis, or occult infection.  Plan: Initiate treatment with IV, Dilaudid, and IV, Valium , and oral prednisone. Home medications, same, orally. Followup PCP as soon as possible.   Flint Melter, MD 12/09/11 [phone]

## 2011-12-07 NOTE — ED Notes (Signed)
Dr Effie Shy in to see the pt.  She wants her saline lok removed.  She is threatening to leave if she does not get med

## 2011-12-07 NOTE — ED Notes (Signed)
The pt did not get her c-t scan.  She was brought back because she  Says she cannot lley her head backward

## 2011-12-07 NOTE — ED Notes (Signed)
Pt being taken to c-t,  She at first refused the c-t but the pa talked to her and she decided to go  For the scan

## 2011-12-07 NOTE — ED Notes (Signed)
The pts pain is not very much better

## 2011-12-07 NOTE — ED Provider Notes (Signed)
History     CSN: [id number]  Arrival date & time 12/06/11  4098   First MD Initiated Contact with Patient 12/06/11 2233      Chief Complaint  Patient presents with  . Neck Pain    (Consider location/radiation/quality/duration/timing/severity/associated sxs/prior treatment) HPI Comments: Jeanne Leach 46 y.o. female   The chief complaint is: Patient presents with:   Neck Pain   The patient has medical history significant for:   Past Medical History:   Hypertension                                                 Diabetes mellitus                                              Comment:gestational only   Degenerative disc disease, cervical                          Degenerative disc disease, lumbar                            Bipolar 1 disorder                                           The onset of the symptoms was 2 weeks ago. c/o pain in the left posterior neck, inability to move neck due to pain and pain in left ear.  Patient was given percocet and flexaril in ED two weeks ago which helped her. Patient went to [facility] urgent care today and was sent over to be evaluated for possible epidural abscess from poor dentition due to persistent c/o neck pain. Patient states that her pain came back after she ran out of pain meds.She denies heat, redness, sweliing, tinitus, jaw pain . She denies Headache, visual disturbance. Stiff neck, or photophobia. She denies numbness and tingling in her hands. No pain down her arms. Denies constitutional symptoms, denies CP or SOB.        Patient is a 46 y.o. female presenting with neck pain. The history is provided by the patient and a relative. No language interpreter was used.  Neck Pain  Pertinent negatives include no chest pain and no headaches.    Past Medical History  Diagnosis Date  . Hypertension   . Diabetes mellitus     gestational only  . Degenerative disc disease, cervical   . Degenerative disc disease, lumbar   . Bipolar 1  disorder     Past Surgical History  Procedure Date  . Cesarean section   . Lipoma excision     No family history on file.  History  Substance Use Topics  . Smoking status: Current Everyday Smoker  . Smokeless tobacco: Not on file  . Alcohol Use: No    OB History    Grav Para Term Preterm Abortions TAB SAB Ect Mult Living                  Review of Systems  Constitutional: Negative for fever and chills.  HENT: Positive for  neck pain and dental problem. Negative for hearing loss, sore throat, facial swelling, mouth sores, trouble swallowing and tinnitus.   Respiratory: Negative for cough and shortness of breath.   Cardiovascular: Negative for chest pain.  Gastrointestinal: Negative for nausea, vomiting, abdominal pain and diarrhea.  Musculoskeletal: Positive for back pain (chronic). Negative for joint swelling.  Skin: Negative for rash and wound.  Neurological: Negative for dizziness and headaches.  All other systems reviewed and are negative.    Allergies  Review of patient's allergies indicates no known allergies.  Home Medications   Current Outpatient Rx  Name Route Sig Dispense Refill  . ALPRAZOLAM 2 MG PO TABS Oral Take 2 mg by mouth 4 (four) times daily.    . FUROSEMIDE 20 MG PO TABS Oral Take 20 mg by mouth daily.    . IBUPROFEN 400 MG PO TABS Oral Take 400 mg by mouth every 6 (six) hours as needed. For pain    . OLANZAPINE-FLUOXETINE HCL 6-50 MG PO CAPS Oral Take 1 capsule by mouth every evening.    . OXYCODONE-ACETAMINOPHEN 10-325 MG PO TABS Oral Take 1 tablet by mouth every 4 (four) hours as needed. For pain    . VALSARTAN 160 MG PO TABS Oral Take 160 mg by mouth daily.      BP 193/115  Pulse 79  Temp 98.5 F (36.9 C) (Oral)  Resp 14  SpO2 100%  LMP 11/10/2011  Physical Exam  Nursing note and vitals reviewed. Constitutional: She is oriented to person, place, and time. She appears well-developed and well-nourished. No distress.       Room smells  of cigarettes.    HENT:  Head: Normocephalic and atraumatic.  Right Ear: Tympanic membrane normal.  Left Ear: Tympanic membrane normal.  Eyes: Conjunctivae are normal. No scleral icterus.  Neck: Normal range of motion.  Cardiovascular: Normal rate, regular rhythm and normal heart sounds.  Exam reveals no gallop and no friction rub.   No murmur heard. Pulmonary/Chest: Effort normal and breath sounds normal. No respiratory distress.  Abdominal: Soft. Bowel sounds are normal. She exhibits no distension and no mass. There is no tenderness. There is no guarding.  Musculoskeletal: She exhibits no edema.       Patient ttp of the left occiput and paraspinal. Refuses to move neck due to pain. No LAD, no swelling, redness, or other signs of infection  Neurological: She is alert and oriented to person, place, and time. No cranial nerve deficit.       Strength 5/5 in arms BL. Sensation grossly intact.  Skin: Skin is warm and dry. She is not diaphoretic.    ED Course  Procedures (including critical care time)  Labs Reviewed - No data to display No results found.  12:07 AM BP 193/115  Pulse 79  Temp 98.5 F (36.9 C) (Oral)  Resp 14  SpO2 100%  LMP 07/11/2013Patient is complaining of pain. I feel that it is likely Musculokeletal considering her history of  Cervical DDD and no known MOI. Urgent care physician's note states that patient recieved 320 percocet last month, and I feel that this may be malingering for pain medication, therefor I am withholding narcotics at this time until evaluation by Dr. Effie Shy. I addressed pain medication with the patient who denies this and states that some one else must have been getting them in her name.  Patient with elevated pressure. She states that she is seeing spots. Will send for Head CT to r/o intracranial bleeds/ hypertensive  urgency.   12:44 AM Patient has refused head CT stating that she had one 2 weeks ago, that she is claustrophobic and that she came  here to be treated for her pain. Of note, the patient had asked for an MRI of her neck upon the recommendation of the urgent care physician she saw earlier today. I addressed this with the patient and explained that the CT was open.  Patient still refused. Patient care  Was then turned over to Dr. Gray Bernhardt as patient was unsatisfied with my treatment  1. Neck pain       MDM  Patient care assumed by Dr. Effie Shy.          Arthor Captain, PA-C 12/07/11 1040

## 2011-12-09 NOTE — ED Provider Notes (Signed)
Medical screening examination/treatment/procedure(s) were conducted as a shared visit with non-physician practitioner(s) and myself.  I personally evaluated the patient during the encounter  Flint Melter, MD 12/09/11 [phone]

## 2011-12-24 ENCOUNTER — Encounter (HOSPITAL_COMMUNITY): Payer: Self-pay | Admitting: *Deleted

## 2011-12-24 ENCOUNTER — Emergency Department (HOSPITAL_COMMUNITY)
Admission: EM | Admit: 2011-12-24 | Discharge: 2011-12-24 | Disposition: A | Discharge status: Home / Self Care | Payer: Medicare Other | Attending: Emergency Medicine | Admitting: Emergency Medicine

## 2011-12-24 DIAGNOSIS — M542 Cervicalgia: Secondary | ICD-10-CM

## 2011-12-24 DIAGNOSIS — G8929 Other chronic pain: Secondary | ICD-10-CM | POA: Insufficient documentation

## 2011-12-24 DIAGNOSIS — M5030 Other cervical disc degeneration, unspecified cervical region: Secondary | ICD-10-CM | POA: Insufficient documentation

## 2011-12-24 DIAGNOSIS — I10 Essential (primary) hypertension: Secondary | ICD-10-CM | POA: Insufficient documentation

## 2011-12-24 DIAGNOSIS — M51379 Other intervertebral disc degeneration, lumbosacral region without mention of lumbar back pain or lower extremity pain: Secondary | ICD-10-CM | POA: Insufficient documentation

## 2011-12-24 DIAGNOSIS — F17200 Nicotine dependence, unspecified, uncomplicated: Secondary | ICD-10-CM | POA: Insufficient documentation

## 2011-12-24 DIAGNOSIS — F319 Bipolar disorder, unspecified: Secondary | ICD-10-CM | POA: Insufficient documentation

## 2011-12-24 DIAGNOSIS — M5137 Other intervertebral disc degeneration, lumbosacral region: Secondary | ICD-10-CM | POA: Insufficient documentation

## 2011-12-24 MED ORDER — CYCLOBENZAPRINE HCL 10 MG PO TABS
10.0000 mg | ORAL_TABLET | Freq: Once | ORAL | Status: AC
  Administered 2011-12-24: 10 mg via ORAL
  Filled 2011-12-24: qty 1

## 2011-12-24 MED ORDER — DIAZEPAM 5 MG PO TABS: 5.0000 mg | ORAL_TABLET | Freq: Four times a day (QID) | ORAL | Status: AC | PRN

## 2011-12-24 MED ORDER — OXYCODONE-ACETAMINOPHEN 5-325 MG PO TABS
2.0000 | ORAL_TABLET | Freq: Once | ORAL | Status: AC
  Administered 2011-12-24: 2 via ORAL
  Filled 2011-12-24: qty 2

## 2011-12-24 NOTE — ED Notes (Signed)
Pt crying,  States she has been sleeping on the floor since her mother had surgery 3 days ago.  States she has pain everywhere c/o neck for the past 3 weeks, lower back pain which she normally takes percocet for but has not had any for several days.   Pain rated 9/10

## 2011-12-24 NOTE — ED Notes (Signed)
Pt states she has been sleeping upstairs as a visitor.  C/o HA, neck pain, and back pain.  Denies CP, SOB.

## 2011-12-24 NOTE — ED Provider Notes (Signed)
History     CSN: [id number]  Arrival date & time 12/24/11  0455   First MD Initiated Contact with Patient 12/24/11 [phone]      Chief Complaint  Patient presents with  . Headache  . Back Pain    (Consider location/radiation/quality/duration/timing/severity/associated sxs/prior treatment) HPI Comments: 46 y/o female with chronic neck, back and hip pain presents with worsening left sided neck pain over the past 4 days since sleeping in the hospital as a visitor for her mom. States she has an appt with a new pain management physician Dr. Verlan Friends on Thursday. Previous physician Dr. Ethelene Hal is no longer doing pain management. Pain has been managed with percocet. She was seen in ED on 8/6 for neck pain after feeling a "tinge" in her neck. prior to that she was in ED on 7/25 for neck pain and headache, had CT scan which was normal. Neck pain worse with crying and feeling stressed. Gets a headache when she cries. Rates pain 9/10. Denies any pain, numbness or tingling down extremities. Denies any fever or chills, visual disturbance. Ibuprofen provides no relief of her pain. Her mom is being discharged soon so she will not be sleeping in the hospital for much longer.  Patient is a 46 y.o. female presenting with headaches and back pain. The history is provided by the patient.  Headache  Pertinent negatives include no fever.  Back Pain  Associated symptoms include headaches. Pertinent negatives include no fever and no numbness.    Past Medical History  Diagnosis Date  . Hypertension   . Diabetes mellitus     gestational only  . Degenerative disc disease, cervical   . Degenerative disc disease, lumbar   . Bipolar 1 disorder     Past Surgical History  Procedure Date  . Cesarean section   . Lipoma excision     History reviewed. No pertinent family history.  History  Substance Use Topics  . Smoking status: Current Everyday Smoker -- 0.5 packs/day  . Smokeless tobacco: Not on file  . Alcohol  Use: No    OB History    Grav Para Term Preterm Abortions TAB SAB Ect Mult Living                  Review of Systems  Constitutional: Negative for fever and chills.  HENT: Positive for neck pain.   Eyes: Negative for visual disturbance.  Musculoskeletal: Positive for back pain.  Neurological: Positive for headaches. Negative for numbness.    Allergies  Review of patient's allergies indicates no known allergies.  Home Medications   Current Outpatient Rx  Name Route Sig Dispense Refill  . ALPRAZOLAM 2 MG PO TABS Oral Take 2 mg by mouth 4 (four) times daily.    . FUROSEMIDE 20 MG PO TABS Oral Take 20 mg by mouth daily.    . IBUPROFEN 400 MG PO TABS Oral Take 400 mg by mouth every 6 (six) hours as needed. For pain    . OLANZAPINE-FLUOXETINE HCL 6-50 MG PO CAPS Oral Take 1 capsule by mouth every evening.    . OXYCODONE-ACETAMINOPHEN 10-325 MG PO TABS Oral Take 1 tablet by mouth every 4 (four) hours as needed. For pain    . VALSARTAN 160 MG PO TABS Oral Take 160 mg by mouth daily.      BP 173/112  Temp 98.2 F (36.8 C) (Oral)  Resp 18  SpO2 100%  Physical Exam  Constitutional: She is oriented to person, place, and time. She  appears well-developed and well-nourished. No distress.  HENT:  Head: Normocephalic and atraumatic.  Eyes: Conjunctivae and EOM are normal. Pupils are equal, round, and reactive to light.  Neck: Neck supple. Muscular tenderness (left paraspinal muscles and trapezius) present. No spinous process tenderness present.       Full cervical ROM, but slow due to pain  Cardiovascular: Normal rate, regular rhythm, normal heart sounds and intact distal pulses.   Pulmonary/Chest: Effort normal and breath sounds normal.  Neurological: She is alert and oriented to person, place, and time. She has normal strength and normal reflexes. No sensory deficit.  Skin: Skin is warm and dry.  Psychiatric: Her speech is normal and behavior is normal. She exhibits a depressed  mood.    ED Course  Procedures (including critical care time)  Labs Reviewed - No data to display No results found.   1. Neck pain   2. Chronic pain       MDM  46 y/o female with chronic neck pain worsening since sleeping in the hospital. States she does not have a car here and is not driving. Asked for the "packets" of percocet given in the ED for her to take upstairs to her moms room to use while she is still here with her mom so she does not have to go to a pharmacy to fill a prescription. Explained we gave her pain medicine in the ED but cannot just give her packets of narcotics. Will discharge with valium as muscle relaxer. F/u with pain management on Thursday.       Trevor Mace, PA-C 12/24/11 [phone]

## 2011-12-29 NOTE — ED Provider Notes (Signed)
Medical screening examination/treatment/procedure(s) were performed by non-physician practitioner and as supervising physician I was immediately available for consultation/collaboration.  Donnetta Hutching, MD 12/29/11 1743

## 2012-01-09 ENCOUNTER — Encounter (HOSPITAL_COMMUNITY): Payer: Self-pay | Admitting: Emergency Medicine

## 2012-01-09 ENCOUNTER — Emergency Department (HOSPITAL_COMMUNITY)
Admission: EM | Admit: 2012-01-09 | Discharge: 2012-01-09 | Disposition: A | Discharge status: Home / Self Care | Payer: Medicare Other | Attending: Emergency Medicine | Admitting: Emergency Medicine

## 2012-01-09 DIAGNOSIS — M5030 Other cervical disc degeneration, unspecified cervical region: Secondary | ICD-10-CM | POA: Insufficient documentation

## 2012-01-09 DIAGNOSIS — F319 Bipolar disorder, unspecified: Secondary | ICD-10-CM | POA: Insufficient documentation

## 2012-01-09 DIAGNOSIS — M51379 Other intervertebral disc degeneration, lumbosacral region without mention of lumbar back pain or lower extremity pain: Secondary | ICD-10-CM | POA: Insufficient documentation

## 2012-01-09 DIAGNOSIS — W010XXA Fall on same level from slipping, tripping and stumbling without subsequent striking against object, initial encounter: Secondary | ICD-10-CM | POA: Insufficient documentation

## 2012-01-09 DIAGNOSIS — I10 Essential (primary) hypertension: Secondary | ICD-10-CM | POA: Insufficient documentation

## 2012-01-09 DIAGNOSIS — IMO0002 Reserved for concepts with insufficient information to code with codable children: Secondary | ICD-10-CM | POA: Insufficient documentation

## 2012-01-09 DIAGNOSIS — M5137 Other intervertebral disc degeneration, lumbosacral region: Secondary | ICD-10-CM | POA: Insufficient documentation

## 2012-01-09 DIAGNOSIS — T148XXA Other injury of unspecified body region, initial encounter: Secondary | ICD-10-CM

## 2012-01-09 MED ORDER — IBUPROFEN 200 MG PO TABS
600.0000 mg | ORAL_TABLET | Freq: Once | ORAL | Status: AC
  Administered 2012-01-09: 600 mg via ORAL
  Filled 2012-01-09: qty 1

## 2012-01-09 MED ORDER — OXYCODONE-ACETAMINOPHEN 5-325 MG PO TABS: 1.0000 | ORAL_TABLET | ORAL | Status: AC | PRN

## 2012-01-09 MED ORDER — METHOCARBAMOL 500 MG PO TABS: 500.0000 mg | ORAL_TABLET | Freq: Two times a day (BID) | ORAL | Status: AC

## 2012-01-09 MED ORDER — IBUPROFEN 600 MG PO TABS: 600.0000 mg | ORAL_TABLET | Freq: Four times a day (QID) | ORAL | Status: AC | PRN

## 2012-01-09 MED ORDER — METHOCARBAMOL 500 MG PO TABS
1000.0000 mg | ORAL_TABLET | Freq: Once | ORAL | Status: AC
  Administered 2012-01-09: 1000 mg via ORAL
  Filled 2012-01-09: qty 2

## 2012-01-09 MED ORDER — OXYCODONE-ACETAMINOPHEN 5-325 MG PO TABS
1.0000 | ORAL_TABLET | Freq: Once | ORAL | Status: AC
  Administered 2012-01-09: 1 via ORAL
  Filled 2012-01-09: qty 1

## 2012-01-09 NOTE — ED Notes (Signed)
Patient not in room for vital signs.

## 2012-01-09 NOTE — ED Notes (Signed)
Patient states tripped and fell of a zero step patio middle of the night. No loc ax4 states left sided neck pain and lower middle back pain with right ankle pain.  Steady gait. Has Doctor's appointment today for chronic back pain however Doctor was on vacation and did not cancel her appointment.

## 2012-01-09 NOTE — ED Provider Notes (Signed)
History    This chart was scribed for Loren Racer, MD, MD by Smitty Pluck. The patient was seen in room TR04C and the patient's care was started at 9:58AM.   CSN: [id number]  Arrival date & time 01/09/12  0946   First MD Initiated Contact with Patient 01/09/12 [phone]     Chief Complaint  Patient presents with  . Fall    (Consider location/radiation/quality/duration/timing/severity/associated sxs/prior treatment) The history is provided by the patient. No language interpreter was used.   Jeanne Leach is a 46 y.o. female who presents to the Emergency Department complaining of fall causing moderate posterior head pain and back pain onset 1 day ago. Pt reports that she slipped on cement patio and hit her head and back. Pt denies LOC. Pt reports a hx of lumbar back pain. Denies any other pain currently. Pt has appointment with PCP in 3 days.    Past Medical History  Diagnosis Date  . Hypertension   . Diabetes mellitus     gestational only  . Degenerative disc disease, cervical   . Degenerative disc disease, lumbar   . Bipolar 1 disorder     Past Surgical History  Procedure Date  . Cesarean section   . Lipoma excision     No family history on file.  History  Substance Use Topics  . Smoking status: Current Every Day Smoker -- 0.5 packs/day  . Smokeless tobacco: Not on file  . Alcohol Use: No    OB History    Grav Para Term Preterm Abortions TAB SAB Ect Mult Living                  Review of Systems  Constitutional: Negative for fever and chills.  Respiratory: Negative for shortness of breath.   Gastrointestinal: Negative for nausea and vomiting.  Musculoskeletal: Positive for back pain.  Neurological: Negative for weakness.    Allergies  Review of patient's allergies indicates no known allergies.  Home Medications   Current Outpatient Rx  Name Route Sig Dispense Refill  . ALPRAZOLAM 2 MG PO TABS Oral Take 2 mg by mouth 4 (four) times daily.    .  FUROSEMIDE 20 MG PO TABS Oral Take 20 mg by mouth daily.    . IBUPROFEN 400 MG PO TABS Oral Take 400 mg by mouth every 6 (six) hours as needed. For pain    . OLANZAPINE-FLUOXETINE HCL 6-50 MG PO CAPS Oral Take 1 capsule by mouth every evening.    . OXYCODONE-ACETAMINOPHEN 10-325 MG PO TABS Oral Take 1 tablet by mouth every 4 (four) hours as needed. For pain    . VALSARTAN 160 MG PO TABS Oral Take 160 mg by mouth daily.    . IBUPROFEN 600 MG PO TABS Oral Take 1 tablet (600 mg total) by mouth every 6 (six) hours as needed for pain. 30 tablet 0  . METHOCARBAMOL 500 MG PO TABS Oral Take 1 tablet (500 mg total) by mouth 2 (two) times daily. 20 tablet 0  . OXYCODONE-ACETAMINOPHEN 5-325 MG PO TABS Oral Take 1 tablet by mouth every 4 (four) hours as needed for pain. 10 tablet 0    There were no vitals taken for this visit.  Physical Exam  Nursing note and vitals reviewed. Constitutional: She is oriented to person, place, and time. She appears well-developed and well-nourished. No distress.  HENT:  Head: Normocephalic and atraumatic.  Pulmonary/Chest: Effort normal. No respiratory distress.  Musculoskeletal:       No  obvious trauma No midline cervical tenderness Left trapezius and left lumbar paraspinal tenderness  Ambulating without difficulty  No midline tenderness   Neurological: She is alert and oriented to person, place, and time.  Skin: Skin is warm and dry.  Psychiatric: She has a normal mood and affect. Her behavior is normal.    ED Course  Procedures (including critical care time)   COORDINATION OF CARE: 10:05 AM Discussed pt ED treatment with pt     Labs Reviewed - No data to display No results found.   1. Muscle strain       MDM  I personally performed the services described in this documentation, which was scribed in my presence. The recorded information has been reviewed and considered.       Loren Racer, MD 01/13/12 [phone]

## 2012-01-16 ENCOUNTER — Encounter (HOSPITAL_COMMUNITY): Payer: Self-pay | Admitting: *Deleted

## 2012-01-16 ENCOUNTER — Emergency Department (HOSPITAL_COMMUNITY)
Admission: EM | Admit: 2012-01-16 | Discharge: 2012-01-17 | Disposition: A | Discharge status: Home / Self Care | Payer: Medicare Other | Attending: Emergency Medicine | Admitting: Emergency Medicine

## 2012-01-16 DIAGNOSIS — F319 Bipolar disorder, unspecified: Secondary | ICD-10-CM | POA: Insufficient documentation

## 2012-01-16 DIAGNOSIS — L0390 Cellulitis, unspecified: Secondary | ICD-10-CM

## 2012-01-16 DIAGNOSIS — I10 Essential (primary) hypertension: Secondary | ICD-10-CM | POA: Insufficient documentation

## 2012-01-16 DIAGNOSIS — L02619 Cutaneous abscess of unspecified foot: Secondary | ICD-10-CM | POA: Insufficient documentation

## 2012-01-16 DIAGNOSIS — F17200 Nicotine dependence, unspecified, uncomplicated: Secondary | ICD-10-CM | POA: Insufficient documentation

## 2012-01-16 DIAGNOSIS — L02519 Cutaneous abscess of unspecified hand: Secondary | ICD-10-CM | POA: Insufficient documentation

## 2012-01-16 DIAGNOSIS — L03019 Cellulitis of unspecified finger: Secondary | ICD-10-CM | POA: Insufficient documentation

## 2012-01-16 DIAGNOSIS — E119 Type 2 diabetes mellitus without complications: Secondary | ICD-10-CM | POA: Insufficient documentation

## 2012-01-16 NOTE — ED Notes (Signed)
Staff wrapped up (R) index finger with gauze

## 2012-01-16 NOTE — ED Notes (Signed)
Pt presents with multiple abscesses on her feet, hand, fingers. Onset was three days ago. Abscesses are red, raised, painful, and draining. Pt denies n/v/d, fevers. Daughter reports pt has been confused, and having an unsteady gait.

## 2012-01-17 LAB — BASIC METABOLIC PANEL
BUN: 10 mg/dL (ref 6–23)
CO2: 28 mEq/L (ref 19–32)
Calcium: 8.7 mg/dL (ref 8.4–10.5)
Creatinine, Ser: 0.64 mg/dL (ref 0.50–1.10)
Glucose, Bld: 105 mg/dL — ABNORMAL HIGH (ref 70–99)

## 2012-01-17 LAB — CBC WITH DIFFERENTIAL/PLATELET
Eosinophils Absolute: 0.4 10*3/uL (ref 0.0–0.7)
Eosinophils Relative: 6 % — ABNORMAL HIGH (ref 0–5)
HCT: 34.6 % — ABNORMAL LOW (ref 36.0–46.0)
Lymphocytes Relative: 41 % (ref 12–46)
Lymphs Abs: 2.7 10*3/uL (ref 0.7–4.0)
MCH: 30.9 pg (ref 26.0–34.0)
MCV: 93 fL (ref 78.0–100.0)
Monocytes Absolute: 0.5 10*3/uL (ref 0.1–1.0)
RBC: 3.72 MIL/uL — ABNORMAL LOW (ref 3.87–5.11)
WBC: 6.6 10*3/uL (ref 4.0–10.5)

## 2012-01-17 MED ORDER — HYDROCODONE-ACETAMINOPHEN 5-325 MG PO TABS
1.0000 | ORAL_TABLET | Freq: Once | ORAL | Status: AC
  Administered 2012-01-17: 1 via ORAL
  Filled 2012-01-17: qty 1

## 2012-01-17 MED ORDER — CLINDAMYCIN HCL 300 MG PO CAPS: 300.0000 mg | ORAL_CAPSULE | Freq: Four times a day (QID) | ORAL | Status: DC

## 2012-01-17 MED ORDER — SODIUM CHLORIDE 0.9 % IV BOLUS (SEPSIS)
1000.0000 mL | Freq: Once | INTRAVENOUS | Status: AC
  Administered 2012-01-17: 1000 mL via INTRAVENOUS

## 2012-01-17 MED ORDER — HYDROCODONE-ACETAMINOPHEN 5-325 MG PO TABS: 1.0000 | ORAL_TABLET | ORAL | Status: DC | PRN

## 2012-01-17 MED ORDER — CLINDAMYCIN PHOSPHATE 900 MG/50ML IV SOLN
900.0000 mg | Freq: Once | INTRAVENOUS | Status: AC
  Administered 2012-01-17: 900 mg via INTRAVENOUS
  Filled 2012-01-17: qty 50

## 2012-01-17 NOTE — ED Provider Notes (Signed)
History     CSN: [id number]  Arrival date & time 01/16/12  4098   First MD Initiated Contact with Patient 01/17/12 0120      Chief Complaint  Patient presents with  . Skin infection    HPI  History provided by the patient. Patient is a 46 year old female with history of hypertension, diabetes and bipolar disorder who presents with concerns for skin infection to the fingers and right foot. Patient reports having several areas of soreness with oozing and drainage. Symptoms began 3 days ago and have this would worsen. Patient is trying to clean areas with peroxide and have an acquaintance without any improvements. She denies having similar symptoms previously. Skin lesions are only on hands and foot no rales on body. She denies any itching to the areas. Denies any new hand lotions or creams. Denies any new soaps.    Past Medical History  Diagnosis Date  . Hypertension   . Diabetes mellitus     gestational only  . Degenerative disc disease, cervical   . Degenerative disc disease, lumbar   . Bipolar 1 disorder     Past Surgical History  Procedure Date  . Cesarean section   . Lipoma excision     No family history on file.  History  Substance Use Topics  . Smoking status: Current Every Day Smoker -- 0.5 packs/day  . Smokeless tobacco: Never Used  . Alcohol Use: No    OB History    Grav Para Term Preterm Abortions TAB SAB Ect Mult Living                  Review of Systems  Constitutional: Negative for fever and chills.  Skin: Positive for rash.    Allergies  Review of patient's allergies indicates no known allergies.  Home Medications   Current Outpatient Rx  Name Route Sig Dispense Refill  . ALPRAZOLAM 2 MG PO TABS Oral Take 2 mg by mouth 4 (four) times daily.    . FUROSEMIDE 20 MG PO TABS Oral Take 20 mg by mouth daily.    . IBUPROFEN 400 MG PO TABS Oral Take 400 mg by mouth every 6 (six) hours as needed. For pain    . IBUPROFEN 600 MG PO TABS Oral Take 1  tablet (600 mg total) by mouth every 6 (six) hours as needed for pain. 30 tablet 0  . METHOCARBAMOL 500 MG PO TABS Oral Take 1 tablet (500 mg total) by mouth 2 (two) times daily. 20 tablet 0  . OLANZAPINE-FLUOXETINE HCL 6-50 MG PO CAPS Oral Take 1 capsule by mouth every evening.    . OXYCODONE-ACETAMINOPHEN 10-325 MG PO TABS Oral Take 1 tablet by mouth every 4 (four) hours as needed. For pain    . OXYCODONE-ACETAMINOPHEN 5-325 MG PO TABS Oral Take 1 tablet by mouth every 4 (four) hours as needed for pain. 10 tablet 0  . VALSARTAN 160 MG PO TABS Oral Take 160 mg by mouth daily.      BP 133/88  Pulse 53  Temp 98.6 F (37 C) (Oral)  Resp 14  Ht 5\' 7"  (1.702 m)  Wt 218 lb 3.2 oz (98.975 kg)  BMI 34.17 kg/m2  SpO2 98%  LMP 01/14/2012  Physical Exam  Nursing note and vitals reviewed. Constitutional: She is oriented to person, place, and time. She appears well-developed and well-nourished. No distress.  HENT:  Head: Normocephalic.  Cardiovascular: Normal rate and regular rhythm.   Pulmonary/Chest: Effort normal and breath sounds  normal. No respiratory distress. She has no wheezes. She has no rales.  Abdominal: Soft.  Musculoskeletal: Normal range of motion.  Neurological: She is alert and oriented to person, place, and time.  Skin: Skin is warm and dry. Rash noted.       Ulcerative lesion to the dorsal aspect of left first digit. There is some surrounding erythema. No circumferential swelling or erythema. Slightly reduced range of motion secondary to pain. No sausage appearance of finger. Normal sensation and cap refills.  Smaller similar lesions to dorsal right hand and second finger.  Papular type lesion to right dorsal foot with slight erythema and oozing.  Psychiatric: She has a normal mood and affect. Her behavior is normal.    ED Course  Procedures   Results for orders placed during the hospital encounter of 01/16/12  CBC WITH DIFFERENTIAL      Component Value Range   WBC  6.6  4.0 - 10.5 K/uL   RBC 3.72 (*) 3.87 - 5.11 MIL/uL   Hemoglobin 11.5 (*) 12.0 - 15.0 g/dL   HCT 40.9 (*) 81.1 - 91.4 %   MCV 93.0  78.0 - 100.0 fL   MCH 30.9  26.0 - 34.0 pg   MCHC 33.2  30.0 - 36.0 g/dL   RDW 78.2  95.6 - 21.3 %   Platelets 399  150 - 400 K/uL   Neutrophils Relative 44  43 - 77 %   Neutro Abs 2.9  1.7 - 7.7 K/uL   Lymphocytes Relative 41  12 - 46 %   Lymphs Abs 2.7  0.7 - 4.0 K/uL   Monocytes Relative 8  3 - 12 %   Monocytes Absolute 0.5  0.1 - 1.0 K/uL   Eosinophils Relative 6 (*) 0 - 5 %   Eosinophils Absolute 0.4  0.0 - 0.7 K/uL   Basophils Relative 1  0 - 1 %   Basophils Absolute 0.1  0.0 - 0.1 K/uL  BASIC METABOLIC PANEL      Component Value Range   Sodium 138  135 - 145 mEq/L   Potassium 3.5  3.5 - 5.1 mEq/L   Chloride 102  96 - 112 mEq/L   CO2 28  19 - 32 mEq/L   Glucose, Bld 105 (*) 70 - 99 mg/dL   BUN 10  6 - 23 mg/dL   Creatinine, Ser 0.86  0.50 - 1.10 mg/dL   Calcium 8.7  8.4 - 57.8 mg/dL   GFR calc non Af Amer >90  >90 mL/min   GFR calc Af Amer >90  >90 mL/min       1. Cellulitis       MDM  Patient seen and evaluated. Patient in no acute distress. At this time we'll give antibiotics for possible cellulitis and coverage for MRSA. Patient with normal WBC and is afebrile. We'll plan to give dermatology referral and patient to followup with PCP as well.        Angus Seller, [place] 01/17/12 [phone]

## 2012-01-17 NOTE — ED Provider Notes (Signed)
Medical screening examination/treatment/procedure(s) were performed by non-physician practitioner and as supervising physician I was immediately available for consultation/collaboration.  Karsten Howry M Valetta Mulroy, MD 01/17/12 0822 

## 2012-02-21 ENCOUNTER — Emergency Department (HOSPITAL_COMMUNITY): Payer: Medicare Other

## 2012-02-21 ENCOUNTER — Encounter (HOSPITAL_COMMUNITY): Payer: Self-pay | Admitting: *Deleted

## 2012-02-21 ENCOUNTER — Emergency Department (HOSPITAL_COMMUNITY)
Admission: EM | Admit: 2012-02-21 | Discharge: 2012-02-21 | Disposition: A | Discharge status: Home / Self Care | Payer: Medicare Other | Attending: Emergency Medicine | Admitting: Emergency Medicine

## 2012-02-21 DIAGNOSIS — Z8659 Personal history of other mental and behavioral disorders: Secondary | ICD-10-CM | POA: Insufficient documentation

## 2012-02-21 DIAGNOSIS — M5030 Other cervical disc degeneration, unspecified cervical region: Secondary | ICD-10-CM | POA: Insufficient documentation

## 2012-02-21 DIAGNOSIS — M542 Cervicalgia: Secondary | ICD-10-CM | POA: Insufficient documentation

## 2012-02-21 DIAGNOSIS — M5137 Other intervertebral disc degeneration, lumbosacral region: Secondary | ICD-10-CM | POA: Insufficient documentation

## 2012-02-21 DIAGNOSIS — F17200 Nicotine dependence, unspecified, uncomplicated: Secondary | ICD-10-CM | POA: Insufficient documentation

## 2012-02-21 DIAGNOSIS — I10 Essential (primary) hypertension: Secondary | ICD-10-CM

## 2012-02-21 DIAGNOSIS — Z8632 Personal history of gestational diabetes: Secondary | ICD-10-CM | POA: Insufficient documentation

## 2012-02-21 DIAGNOSIS — M51379 Other intervertebral disc degeneration, lumbosacral region without mention of lumbar back pain or lower extremity pain: Secondary | ICD-10-CM | POA: Insufficient documentation

## 2012-02-21 LAB — CBC WITH DIFFERENTIAL/PLATELET
Basophils Absolute: 0 10*3/uL (ref 0.0–0.1)
Basophils Relative: 0 % (ref 0–1)
Eosinophils Absolute: 0.1 10*3/uL (ref 0.0–0.7)
Eosinophils Relative: 1 % (ref 0–5)
Lymphs Abs: 2.3 10*3/uL (ref 0.7–4.0)
MCH: 31.9 pg (ref 26.0–34.0)
MCHC: 34.8 g/dL (ref 30.0–36.0)
MCV: 91.7 fL (ref 78.0–100.0)
Platelets: 512 10*3/uL — ABNORMAL HIGH (ref 150–400)
RDW: 13.7 % (ref 11.5–15.5)

## 2012-02-21 LAB — BASIC METABOLIC PANEL
Calcium: 9.6 mg/dL (ref 8.4–10.5)
GFR calc non Af Amer: >90 mL/min (ref >90)
Glucose, Bld: 144 mg/dL — ABNORMAL HIGH (ref 70–99)
Sodium: 137 mEq/L (ref 135–145)

## 2012-02-21 MED ORDER — MORPHINE SULFATE 4 MG/ML IJ SOLN
4.0000 mg | Freq: Once | INTRAMUSCULAR | Status: AC
  Administered 2012-02-21: 4 mg via INTRAVENOUS
  Filled 2012-02-21: qty 1

## 2012-02-21 MED ORDER — OXYCODONE-ACETAMINOPHEN 5-325 MG PO TABS
1.0000 | ORAL_TABLET | Freq: Once | ORAL | Status: AC
  Administered 2012-02-21: 1 via ORAL
  Filled 2012-02-21: qty 1

## 2012-02-21 MED ORDER — LABETALOL HCL 5 MG/ML IV SOLN
20.0000 mg | Freq: Once | INTRAVENOUS | Status: AC
  Administered 2012-02-21: 20 mg via INTRAVENOUS
  Filled 2012-02-21 (×2): qty 4

## 2012-02-21 MED ORDER — ONDANSETRON HCL 4 MG/2ML IJ SOLN
4.0000 mg | Freq: Once | INTRAMUSCULAR | Status: AC
  Administered 2012-02-21: 4 mg via INTRAVENOUS
  Filled 2012-02-21: qty 2

## 2012-02-21 MED ORDER — AMLODIPINE BESYLATE 5 MG PO TABS: 5.0000 mg | ORAL_TABLET | Freq: Every day | ORAL | Status: DC
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
## 2012-02-21 NOTE — ED Notes (Signed)
Pt is here with left neck pain that started Friday evening and the pain moves up into head and into left ear.  Pt states she had a bad tooth but not sure that is it.  BP 228/129.  Pt reports dizziness.  No speech deficit.  No history of high blood pressure

## 2012-02-21 NOTE — ED Notes (Signed)
Patient transported to CT 

## 2012-02-21 NOTE — ED Notes (Signed)
Pt returned from CT. No distress noted.

## 2012-02-22 NOTE — ED Provider Notes (Signed)
Medical screening examination/treatment/procedure(s) were performed by non-physician practitioner and as supervising physician I was immediately available for consultation/collaboration.  Tobin Chad, MD 02/22/12 [phone]

## 2012-08-20 ENCOUNTER — Encounter: Payer: Self-pay | Admitting: *Deleted

## 2013-03-14 ENCOUNTER — Emergency Department (HOSPITAL_COMMUNITY): Payer: Medicare Other

## 2013-03-14 ENCOUNTER — Encounter (HOSPITAL_COMMUNITY): Payer: Self-pay | Admitting: Emergency Medicine

## 2013-03-14 ENCOUNTER — Emergency Department (HOSPITAL_COMMUNITY)
Admission: EM | Admit: 2013-03-14 | Discharge: 2013-03-14 | Disposition: A | Discharge status: Home / Self Care | Payer: Medicare Other | Attending: Emergency Medicine | Admitting: Emergency Medicine

## 2013-03-14 DIAGNOSIS — Z8632 Personal history of gestational diabetes: Secondary | ICD-10-CM | POA: Insufficient documentation

## 2013-03-14 DIAGNOSIS — I10 Essential (primary) hypertension: Secondary | ICD-10-CM | POA: Insufficient documentation

## 2013-03-14 DIAGNOSIS — Y939 Activity, unspecified: Secondary | ICD-10-CM | POA: Insufficient documentation

## 2013-03-14 DIAGNOSIS — X500XXA Overexertion from strenuous movement or load, initial encounter: Secondary | ICD-10-CM | POA: Insufficient documentation

## 2013-03-14 DIAGNOSIS — Z8739 Personal history of other diseases of the musculoskeletal system and connective tissue: Secondary | ICD-10-CM | POA: Insufficient documentation

## 2013-03-14 DIAGNOSIS — Y929 Unspecified place or not applicable: Secondary | ICD-10-CM | POA: Insufficient documentation

## 2013-03-14 DIAGNOSIS — F17200 Nicotine dependence, unspecified, uncomplicated: Secondary | ICD-10-CM | POA: Insufficient documentation

## 2013-03-14 DIAGNOSIS — S93409A Sprain of unspecified ligament of unspecified ankle, initial encounter: Secondary | ICD-10-CM | POA: Insufficient documentation

## 2013-03-14 DIAGNOSIS — Z8659 Personal history of other mental and behavioral disorders: Secondary | ICD-10-CM | POA: Insufficient documentation

## 2013-03-14 DIAGNOSIS — S93402A Sprain of unspecified ligament of left ankle, initial encounter: Secondary | ICD-10-CM

## 2013-03-14 MED ORDER — OXYCODONE-ACETAMINOPHEN 5-325 MG PO TABS
1.0000 | ORAL_TABLET | Freq: Once | ORAL | Status: AC
  Administered 2013-03-14: 1 via ORAL
  Filled 2013-03-14: qty 1

## 2013-03-14 MED ORDER — KETOROLAC TROMETHAMINE 60 MG/2ML IM SOLN
60.0000 mg | Freq: Once | INTRAMUSCULAR | Status: AC
  Administered 2013-03-14: 60 mg via INTRAMUSCULAR
  Filled 2013-03-14: qty 2

## 2013-03-14 MED ORDER — OXYCODONE-ACETAMINOPHEN 5-325 MG PO TABS: 1.0000 | ORAL_TABLET | Freq: Three times a day (TID) | ORAL | Status: DC | PRN

## 2013-03-14 NOTE — ED Provider Notes (Signed)
CSN: [id number]     Arrival date & time 03/14/13  1228 History  This chart was scribed for non-physician practitioner working with Jeanne Camel, MD by Jeanne Leach, ED scribe. This patient was seen in room TR10C/TR10C and the patient's care was started at 2:59 PM.  First MD Initiated Contact with Patient 03/14/13 1242     Chief Complaint  Patient presents with  . Fall  . Ankle Pain   (Consider location/radiation/quality/duration/timing/severity/associated sxs/prior Treatment) The history is provided by the patient and medical records. No language interpreter was used.   HPI Comments: Jeanne Leach is a 47 y.o. female who presents to the Emergency Department complaining of ankle pain after a fall yesterday. She reports she stepped on a broken cement block and it flipped upwards causing her to twist her foot and fall onto a wall. Pt describes the pain as waxing and waning with stinging and burning. She reported that she is experiencing throbbing pain from the area of swelling. The pain is worse with standing, movement and palpation at the ankle. She has tried [facility] and elevation with no relief. Pt denies chest pain, SOB, numbness, and tingling.Pt injured her left ankle prior to this incident.  She has a medical hx of DM, HTN, degenerative disc disease (cerivcal and lumbar) and Bipolar 1 disorders. Pt smokes 0.5 packs of tobacco every day  and does not drink alcohol.   Past Medical History  Diagnosis Date  . Hypertension   . Diabetes mellitus     gestational only  . Degenerative disc disease, cervical   . Degenerative disc disease, lumbar   . Bipolar 1 disorder    Past Surgical History  Procedure Laterality Date  . Cesarean section    . Lipoma excision     History reviewed. No pertinent family history. History  Substance Use Topics  . Smoking status: Current Every Day Smoker -- 0.50 packs/day  . Smokeless tobacco: Never Used  . Alcohol Use: No   OB History   Grav Para Term  Preterm Abortions TAB SAB Ect Mult Living                 Review of Systems  Respiratory: Negative for shortness of breath.   Cardiovascular: Negative for chest pain.  Musculoskeletal: Positive for arthralgias and joint swelling.  Neurological: Negative for weakness and numbness.  All other systems reviewed and are negative.    Allergies  Codeine  Home Medications   Current Outpatient Rx  Name  Route  Sig  Dispense  Refill  . oxyCODONE-acetaminophen (PERCOCET/ROXICET) 5-325 MG per tablet   Oral   Take 1 tablet by mouth every 8 (eight) hours as needed for severe pain.   5 tablet   0    BP 130/88  Temp(Src) 99 F (37.2 C) (Oral)  Resp 20  SpO2 96% Physical Exam  Nursing note and vitals reviewed. Constitutional: She is oriented to person, place, and time. She appears well-developed and well-nourished. No distress.  HENT:  Head: Normocephalic and atraumatic.  Neck: Normal range of motion. Neck supple.  Musculoskeletal: She exhibits tenderness.       Feet:  Swelling localized to the lateral malleolus of the left ankle. Negative signs of ecchymosis. Negative deformities, erythema, inflammation, warmth upon palpation. Pain upon palpation to the lateral malleolus of the left ankle. Decreased range of motion secondary to pain. Full range of motion to digits of left foot.  Neurological: She is alert and oriented to person, place, and time.  She exhibits normal muscle tone. Coordination normal.  Strength intact to digits of the left foot Strength 5+/5+ lower tremors bilaterally with resistance applied, equal distribution noted Sensation intact with differentiation to sharp and dull touch  Skin: Skin is warm and dry. No rash noted. She is not diaphoretic. No erythema.  Psychiatric: She has a normal mood and affect. Her behavior is normal. Thought content normal.    ED Course  Procedures (including critical care time) DIAGNOSTIC STUDIES: Oxygen Saturation is 96% on room air,  normal by my interpretation.    COORDINATION OF CARE: 3:03 PM Discussed course of care with pt . Pt understands and agrees.  Dg Ankle Complete Left  03/14/2013   CLINICAL DATA:  Left ankle pain following injury  EXAM: LEFT ANKLE COMPLETE - 3+ VIEW  COMPARISON:  None.  FINDINGS: Lateral soft tissue swelling is noted. No acute fracture or dislocation is noted.  IMPRESSION: Soft tissue swelling without acute bony abnormality.   Electronically Signed   By: Alcide Clever M.D.   On: 03/14/2013 13:55   Labs Review Labs Reviewed - No data to display Imaging Review Dg Ankle Complete Left     EKG Interpretation   None       MDM   1. Left ankle sprain, initial encounter    Medications  ketorolac (TORADOL) injection 60 mg (60 mg Intramuscular Given 03/14/13 1249)  oxyCODONE-acetaminophen (PERCOCET/ROXICET) 5-325 MG per tablet 1 tablet (1 tablet Oral Given 03/14/13 1520)   Filed Vitals:   03/14/13 1229 03/14/13 1516  BP: 130/88 112/87  Pulse:  70  Temp: 99 F (37.2 C)   TempSrc: Oral   Resp: 20   SpO2: 96% 99%   I personally performed the services described in this documentation, which was scribed in my presence. The recorded information has been reviewed and is accurate.  Patient presenting to emergency department with left ankle pain secondary to fall that occurred yesterday while walking down the steps. Alert and oriented. Swelling localized to the lateral malleolus of the left ankle with pain upon palpation. Decreased range of motion secondary to pain. Full range of motion to the digits of the left foot. Pulses palpable-DP 2+ bilaterally. Sensation intact. Strength intact with equal distribution. Plain films of left ankle noted lateral soft tissue swelling with no fractures or dislocations identified. Doubt septic joint. Negative findings for fractures. Suspicion to be ligamental injury. Patient placed in camwalker boot with crutches administered. Small dose of pain medications  administered - discussed course, precautions, disposal techniques. Referred patient to orthopedics. Discussed with patient to rest, ice, elevate. Discussed with patient to closely monitor symptoms and if symptoms are to worsen or change report back to emergency department - strict return structures given. Patient agreed to plan of care, understood, all questions answered.    Raymon Mutton, PA-C 03/14/13 2223

## 2013-03-14 NOTE — ED Notes (Signed)
Patient returned from radiology

## 2013-03-14 NOTE — ED Notes (Signed)
Pt reports falling yesterday and having pain and swelling to left ankle. +pedal pulse.

## 2013-03-14 NOTE — Progress Notes (Signed)
Orthopedic Tech Progress Note Patient Details:  Jeanne Leach [birth year] [id number]  Ortho Devices Type of Ortho Device: CAM walker;Crutches Ortho Device/Splint Location: LLE Ortho Device/Splint Interventions: Ordered;Application   [name] 03/14/2013, 3:46 PM

## 2013-03-15 NOTE — ED Provider Notes (Signed)
Medical screening examination/treatment/procedure(s) were performed by non-physician practitioner and as supervising physician I was immediately available for consultation/collaboration.  EKG Interpretation   None         Alycen Mack T Zuley Lutter, MD 03/15/13 0706 

## 2013-05-21 ENCOUNTER — Inpatient Hospital Stay: Payer: Self-pay | Admitting: Psychiatry

## 2013-05-21 LAB — URINALYSIS, COMPLETE
BILIRUBIN, UR: NEGATIVE
BLOOD: NEGATIVE
Glucose,UR: NEGATIVE mg/dL (ref 0–75)
Hyaline Cast: 4
Ketone: NEGATIVE
LEUKOCYTE ESTERASE: NEGATIVE
NITRITE: NEGATIVE
PROTEIN: NEGATIVE
Ph: 5 (ref 4.5–8.0)
RBC,UR: 1 /HPF (ref 0–5)
Specific Gravity: 1.023 (ref 1.003–1.030)
Squamous Epithelial: 11

## 2013-05-21 LAB — CBC
HCT: 43 % (ref 35.0–47.0)
HGB: 14.1 g/dL (ref 12.0–16.0)
MCH: 31.7 pg (ref 26.0–34.0)
MCHC: 32.9 g/dL (ref 32.0–36.0)
MCV: 96 fL (ref 80–100)
Platelet: 507 10*3/uL — ABNORMAL HIGH (ref 150–440)
RBC: 4.46 10*6/uL (ref 3.80–5.20)
RDW: 13.6 % (ref 11.5–14.5)
WBC: 16.8 10*3/uL — ABNORMAL HIGH (ref 3.6–11.0)

## 2013-05-21 LAB — COMPREHENSIVE METABOLIC PANEL
Albumin: 4 g/dL (ref 3.4–5.0)
Alkaline Phosphatase: 62 U/L
Anion Gap: 4 — ABNORMAL LOW (ref 7–16)
BILIRUBIN TOTAL: 0.3 mg/dL (ref 0.2–1.0)
BUN: 11 mg/dL (ref 7–18)
CREATININE: 0.94 mg/dL (ref 0.60–1.30)
Calcium, Total: 9.6 mg/dL (ref 8.5–10.1)
Chloride: 96 mmol/L — ABNORMAL LOW (ref 98–107)
Co2: 31 mmol/L (ref 21–32)
EGFR (African American): >60
EGFR (Non-African Amer.): >60
GLUCOSE: 163 mg/dL — AB (ref 65–99)
Osmolality: 266 (ref 275–301)
Potassium: 3.6 mmol/L (ref 3.5–5.1)
SGOT(AST): 20 U/L (ref 15–37)
SGPT (ALT): 18 U/L (ref 12–78)
Sodium: 131 mmol/L — ABNORMAL LOW (ref 136–145)
TOTAL PROTEIN: 9.3 g/dL — AB (ref 6.4–8.2)

## 2013-05-21 LAB — DRUG SCREEN, URINE
Amphetamines, Ur Screen: NEGATIVE (ref ?–1000)
BENZODIAZEPINE, UR SCRN: POSITIVE (ref ?–200)
Barbiturates, Ur Screen: NEGATIVE (ref ?–200)
CANNABINOID 50 NG, UR ~~LOC~~: NEGATIVE (ref ?–50)
Cocaine Metabolite,Ur ~~LOC~~: NEGATIVE (ref ?–300)
MDMA (Ecstasy)Ur Screen: NEGATIVE (ref ?–500)
METHADONE, UR SCREEN: NEGATIVE (ref ?–300)
Opiate, Ur Screen: POSITIVE (ref ?–300)
PHENCYCLIDINE (PCP) UR S: NEGATIVE (ref ?–25)
TRICYCLIC, UR SCREEN: NEGATIVE (ref ?–1000)

## 2013-05-21 LAB — SALICYLATE LEVEL: Salicylates, Serum: 4.8 mg/dL — ABNORMAL HIGH

## 2013-05-21 LAB — ETHANOL: Ethanol %: <0.003 % (ref 0.000–0.080)

## 2013-05-21 LAB — ACETAMINOPHEN LEVEL: Acetaminophen: <2 ug/mL

## 2013-07-12 ENCOUNTER — Encounter (HOSPITAL_COMMUNITY): Payer: Self-pay | Admitting: Emergency Medicine

## 2013-07-12 ENCOUNTER — Emergency Department (HOSPITAL_COMMUNITY)
Admission: EM | Admit: 2013-07-12 | Discharge: 2013-07-12 | Disposition: A | Discharge status: Home / Self Care | Payer: Medicare Other | Attending: Emergency Medicine | Admitting: Emergency Medicine

## 2013-07-12 ENCOUNTER — Emergency Department (HOSPITAL_COMMUNITY): Payer: Medicare Other

## 2013-07-12 DIAGNOSIS — Z79899 Other long term (current) drug therapy: Secondary | ICD-10-CM | POA: Insufficient documentation

## 2013-07-12 DIAGNOSIS — E119 Type 2 diabetes mellitus without complications: Secondary | ICD-10-CM | POA: Insufficient documentation

## 2013-07-12 DIAGNOSIS — F17200 Nicotine dependence, unspecified, uncomplicated: Secondary | ICD-10-CM | POA: Insufficient documentation

## 2013-07-12 DIAGNOSIS — Z8659 Personal history of other mental and behavioral disorders: Secondary | ICD-10-CM | POA: Insufficient documentation

## 2013-07-12 DIAGNOSIS — Z8739 Personal history of other diseases of the musculoskeletal system and connective tissue: Secondary | ICD-10-CM | POA: Insufficient documentation

## 2013-07-12 DIAGNOSIS — Y92009 Unspecified place in unspecified non-institutional (private) residence as the place of occurrence of the external cause: Secondary | ICD-10-CM | POA: Insufficient documentation

## 2013-07-12 DIAGNOSIS — S8000XA Contusion of unspecified knee, initial encounter: Secondary | ICD-10-CM | POA: Insufficient documentation

## 2013-07-12 DIAGNOSIS — I10 Essential (primary) hypertension: Secondary | ICD-10-CM | POA: Insufficient documentation

## 2013-07-12 DIAGNOSIS — S8001XA Contusion of right knee, initial encounter: Secondary | ICD-10-CM

## 2013-07-12 DIAGNOSIS — R296 Repeated falls: Secondary | ICD-10-CM | POA: Insufficient documentation

## 2013-07-12 DIAGNOSIS — M25561 Pain in right knee: Secondary | ICD-10-CM

## 2013-07-12 DIAGNOSIS — Y9389 Activity, other specified: Secondary | ICD-10-CM | POA: Insufficient documentation

## 2013-07-12 DIAGNOSIS — Z791 Long term (current) use of non-steroidal anti-inflammatories (NSAID): Secondary | ICD-10-CM | POA: Insufficient documentation

## 2013-07-12 MED ORDER — MELOXICAM 7.5 MG PO TABS: 15.0000 mg | ORAL_TABLET | Freq: Every day | ORAL | Status: DC

## 2013-07-12 MED ORDER — OXYCODONE-ACETAMINOPHEN 5-325 MG PO TABS
1.0000 | ORAL_TABLET | Freq: Once | ORAL | Status: AC
  Administered 2013-07-12: 2 via ORAL
  Filled 2013-07-12: qty 2

## 2013-07-12 NOTE — ED Notes (Signed)
Patient states fell last night while trying to "get into a cabinet".   Patient states "i think I broke my knee".

## 2013-07-12 NOTE — ED Provider Notes (Signed)
CSN: [id number]     Arrival date & time 07/12/13  [id number] History  This chart was scribed for Francee PiccoloJennifer Elenora Hawbaker, PA, working with Toy BakerAnthony T Allen, MD, by Beverly MilchJ Harrison Collins ED Scribe. This patient was seen in room TR09C/TR09C and the patient's care was started at 10:00 AM.    Chief Complaint  Patient presents with  . Knee Pain    The history is provided by the patient. No language interpreter was used.   HPI Comments: Jeanne Leach is a 48 y.o. female who presents to the Emergency Department complaining of constant worsening right knee pain that began after falling and landing on her side and knee last night while trying to get into a high cabinet in her kitchen. She states she believes she broke her knee. Pt reports taking percocet PRN for severe pain. Pt reports she used ice and heat on her right knee and did take "pain pills" at 6 AM today with no relief. Pt denies associated head injury, syncope, vomiting, loss of feeling in her lower extremities. Pt denies h/o knee problems. She reports she has broken her right ankle before.       Past Medical History  Diagnosis Date  . Hypertension   . Diabetes mellitus     gestational only  . Degenerative disc disease, cervical   . Degenerative disc disease, lumbar   . Bipolar 1 disorder     Past Surgical History  Procedure Laterality Date  . Cesarean section    . Lipoma excision      No family history on file. History  Substance Use Topics  . Smoking status: Current Every Day Smoker -- 0.50 packs/day  . Smokeless tobacco: Never Used  . Alcohol Use: No    OB History   Grav Para Term Preterm Abortions TAB SAB Ect Mult Living                  Review of Systems  Musculoskeletal: Positive for arthralgias (Right knee) and joint swelling (right knee).  All other systems reviewed and are negative.      Allergies  Codeine  Home Medications    Current Outpatient Rx  Name  Route  Sig  Dispense  Refill  . Multiple Vitamin  (MULTI-VITAMIN PO)   Oral   Take 1 tablet by mouth daily.         Marland Kitchen. oxyCODONE-acetaminophen (PERCOCET/ROXICET) 5-325 MG per tablet   Oral   Take 1 tablet by mouth every 4 (four) hours as needed for severe pain.         . valsartan (DIOVAN) 160 MG tablet   Oral   Take 160 mg by mouth daily.         . meloxicam (MOBIC) 7.5 MG tablet   Oral   Take 2 tablets (15 mg total) by mouth daily.   30 tablet   0    Triage Vitals: BP 121/80  Pulse 93  Temp(Src) 98.9 F (37.2 C) (Oral)  Resp 20  Ht 5\' 6"  (1.676 m)  Wt 218 lb (98.884 kg)  BMI 35.20 kg/m2  SpO2 97%  LMP 07/09/2013  Physical Exam  Constitutional: She is oriented to person, place, and time. She appears well-developed and well-nourished. No distress.  HENT:  Head: Normocephalic and atraumatic.  Right Ear: External ear normal.  Left Ear: External ear normal.  Nose: Nose normal.  Mouth/Throat: Oropharynx is clear and moist. No oropharyngeal exudate.  Eyes: Conjunctivae are normal.  Neck: Neck supple.  Cardiovascular:  Normal rate, regular rhythm, normal heart sounds and intact distal pulses.   Pulmonary/Chest: Effort normal.  Musculoskeletal:       Right knee: She exhibits decreased range of motion, swelling and ecchymosis. She exhibits no deformity, no laceration and no erythema. Tenderness found.       Left knee: Normal.       Right ankle: Normal.       Left ankle: Normal.       Right upper leg: Normal.       Left upper leg: Normal.  Right knee ligament exams limited due to swelling and pain  Neurological: She is alert and oriented to person, place, and time. No cranial nerve deficit.  Skin: She is not diaphoretic.  Psychiatric: She has a normal mood and affect. Her behavior is normal.     ED Course  Procedures (including critical care time) Medications  oxyCODONE-acetaminophen (PERCOCET/ROXICET) 5-325 MG per tablet 1-2 tablet (2 tablets Oral Given 07/12/13 1043)     DIAGNOSTIC STUDIES: Oxygen  Saturation is 97% on RA, normal by my interpretation.    COORDINATION OF CARE: 10:11 AM- Pt advised of plan for treatment and pt agrees.    Labs Review Labs Reviewed - No data to display Imaging Review Dg Knee Complete 4 Views Right  07/12/2013   CLINICAL DATA:  Knee pain  EXAM: RIGHT KNEE - COMPLETE 4+ VIEW  COMPARISON:  None.  FINDINGS: There is no evidence of fracture, dislocation, or joint effusion. There is no evidence of arthropathy or other focal bone abnormality. Soft tissues are unremarkable.  IMPRESSION: No acute fracture or dislocation.   Electronically Signed   By: Sherian Rein M.D.   On: 07/12/2013 10:35     EKG Interpretation None      MDM   Final diagnoses:  Right knee pain  Contusion of right knee    Filed Vitals:   07/12/13 0945  BP: 121/80  Pulse: 93  Temp: 98.9 F (37.2 C)  Resp: 20    Afebrile, NAD, non-toxic appearing, AAOx4.   Neurovascularly intact. Normal sensation. Swelling and bruising noted to right knee. Limited MCL, ACL, PCL, LCL, and meniscus examination d/t pain and swelling. X-ray negative for acute fracture, dislocation, or joint effusion. Concern for possible ligamentous injury given swelling, will brace knee, give crutches. Discussed RICE method. Advised use of at home narcotic pain medications. Advised orthopedic f/u once swelling decreases. Return precautions discussed. Patient is agreeable to plan. Patient is stable at time of discharge   I personally performed the services described in this documentation, which was scribed in my presence. The recorded information has been reviewed and is accurate.    Jeannetta Ellis, PA-C 07/12/13 1626

## 2013-07-12 NOTE — ED Notes (Signed)
Went to discharge patient.   Patient still getting dressed.  Patient didn't want to go over discharge instructions until she finished getting dressed.

## 2013-07-12 NOTE — Discharge Instructions (Signed)
Please follow up with your primary care physician in 1-2 days. If you do not have one [facility] the [facility] listed above. Please follow up with Dr. Magnus IvanBlackman, the orthopedist, to schedule a follow up appointment, please give his office a call in 1 week to set up the appointment. Please take your narcotics prescribed to you by your primary care doctor. Please use Mobic as prescribed. Please follow RICE method. Please read all discharge instructions and return precautions.    Knee Pain Knee pain can be a result of an injury or other medical conditions. Treatment will depend on the cause of your pain. HOME CARE  Only take medicine as told by your doctor.  Keep a healthy weight. Being overweight can make the knee hurt more.  Stretch before exercising or playing sports.  If there is constant knee pain, change the way you exercise. Ask your doctor for advice.  Make sure shoes fit well. Choose the right shoe for the sport or activity.  Protect your knees. Wear kneepads if needed.  Rest when you are tired. GET HELP RIGHT AWAY IF:   Your knee pain does not stop.  Your knee pain does not get better.  Your knee joint feels hot to the touch.  You have a fever. MAKE SURE YOU:   Understand these instructions.  Will watch this condition.  Will get help right away if you are not doing well or get worse. Document Released: 07/15/2008 Document Revised: 07/11/2011 Document Reviewed: 07/15/2008 [facility] Patient Information 2014 [employer] [place].

## 2013-07-12 NOTE — ED Notes (Signed)
When patient left room, she refused to take the crutches, stating that she had a set at home just like them.

## 2013-07-15 NOTE — ED Provider Notes (Signed)
Medical screening examination/treatment/procedure(s) were performed by non-physician practitioner and as supervising physician I was immediately available for consultation/collaboration.   Missey Hasley T Steffon Gladu, MD 07/15/13 0739 

## 2014-06-23 ENCOUNTER — Emergency Department (HOSPITAL_COMMUNITY)
Admission: EM | Admit: 2014-06-23 | Discharge: 2014-06-24 | Disposition: A | Discharge status: Home / Self Care | Payer: Medicare Other | Attending: Emergency Medicine | Admitting: Emergency Medicine

## 2014-06-23 ENCOUNTER — Encounter (HOSPITAL_COMMUNITY): Payer: Self-pay | Admitting: Emergency Medicine

## 2014-06-23 DIAGNOSIS — R569 Unspecified convulsions: Secondary | ICD-10-CM | POA: Insufficient documentation

## 2014-06-23 DIAGNOSIS — Z8659 Personal history of other mental and behavioral disorders: Secondary | ICD-10-CM | POA: Insufficient documentation

## 2014-06-23 DIAGNOSIS — Z8632 Personal history of gestational diabetes: Secondary | ICD-10-CM | POA: Insufficient documentation

## 2014-06-23 DIAGNOSIS — Z8739 Personal history of other diseases of the musculoskeletal system and connective tissue: Secondary | ICD-10-CM | POA: Diagnosis not present

## 2014-06-23 DIAGNOSIS — I10 Essential (primary) hypertension: Secondary | ICD-10-CM | POA: Diagnosis not present

## 2014-06-23 DIAGNOSIS — Z3202 Encounter for pregnancy test, result negative: Secondary | ICD-10-CM | POA: Diagnosis not present

## 2014-06-23 DIAGNOSIS — R410 Disorientation, unspecified: Secondary | ICD-10-CM | POA: Insufficient documentation

## 2014-06-23 DIAGNOSIS — Z720 Tobacco use: Secondary | ICD-10-CM | POA: Insufficient documentation

## 2014-06-23 DIAGNOSIS — Z79899 Other long term (current) drug therapy: Secondary | ICD-10-CM | POA: Insufficient documentation

## 2014-06-23 DIAGNOSIS — Z791 Long term (current) use of non-steroidal anti-inflammatories (NSAID): Secondary | ICD-10-CM | POA: Diagnosis not present

## 2014-06-23 LAB — CBC WITH DIFFERENTIAL/PLATELET
BASOS ABS: 0.1 10*3/uL (ref 0.0–0.1)
BASOS PCT: 0 % (ref 0–1)
Eosinophils Absolute: 0.2 10*3/uL (ref 0.0–0.7)
Eosinophils Relative: 2 % (ref 0–5)
HCT: 38.1 % (ref 36.0–46.0)
Hemoglobin: 13 g/dL (ref 12.0–15.0)
LYMPHS PCT: 11 % — AB (ref 12–46)
Lymphs Abs: 1.8 10*3/uL (ref 0.7–4.0)
MCH: 30.2 pg (ref 26.0–34.0)
MCHC: 34.1 g/dL (ref 30.0–36.0)
MCV: 88.6 fL (ref 78.0–100.0)
MONO ABS: 0.8 10*3/uL (ref 0.1–1.0)
Monocytes Relative: 5 % (ref 3–12)
NEUTROS PCT: 82 % — AB (ref 43–77)
Neutro Abs: 13.1 10*3/uL — ABNORMAL HIGH (ref 1.7–7.7)
PLATELETS: 432 10*3/uL — AB (ref 150–400)
RBC: 4.3 MIL/uL (ref 3.87–5.11)
RDW: 13.5 % (ref 11.5–15.5)
WBC: 15.9 10*3/uL — AB (ref 4.0–10.5)

## 2014-06-23 LAB — I-STAT CHEM 8, ED
BUN: 14 mg/dL (ref 6–23)
CREATININE: 0.9 mg/dL (ref 0.50–1.10)
Calcium, Ion: 1.2 mmol/L (ref 1.12–1.23)
Chloride: 97 mmol/L (ref 96–112)
Glucose, Bld: 113 mg/dL — ABNORMAL HIGH (ref 70–99)
HEMATOCRIT: 44 % (ref 36.0–46.0)
HEMOGLOBIN: 15 g/dL (ref 12.0–15.0)
Potassium: 3.8 mmol/L (ref 3.5–5.1)
Sodium: 136 mmol/L (ref 135–145)
TCO2: 25 mmol/L (ref 0–100)

## 2014-06-23 MED ORDER — SODIUM CHLORIDE 0.9 % IV BOLUS (SEPSIS)
1000.0000 mL | Freq: Once | INTRAVENOUS | Status: AC
  Administered 2014-06-23: 1000 mL via INTRAVENOUS

## 2014-06-23 MED ORDER — PROMETHAZINE HCL 25 MG/ML IJ SOLN
12.5000 mg | Freq: Once | INTRAMUSCULAR | Status: AC
  Administered 2014-06-23: 12.5 mg via INTRAVENOUS
  Filled 2014-06-23: qty 1

## 2014-06-23 NOTE — ED Notes (Signed)
Pt at witnessed seizure at home. Pt was sitting on side of bed and fell back onto bed and "starting shaking all over" per family member approx 5 minutes. No hx of seizures

## 2014-06-23 NOTE — ED Notes (Signed)
Family at bedside. 

## 2014-06-23 NOTE — ED Provider Notes (Addendum)
CSN: [id number]     Arrival date & time 06/23/14  2151 History  This chart was scribe for Jeanne Leach Smitty Cords, MD by Angelene Giovanni, ED Scribe. The patient was seen in room D34C/D34C and the patient's care was started at 11:09 PM.    Chief Complaint  Patient presents with  . Seizures   Patient is a 49 y.o. female presenting with seizures. The history is provided by the patient, a relative and the EMS personnel. No language interpreter was used.  Seizures Seizure activity on arrival: no   Seizure type:  Grand mal Preceding symptoms: no sensation of an aura present   Initial focality:  None Episode characteristics: generalized shaking   Postictal symptoms: no confusion   Return to baseline: yes   Severity:  Moderate Timing:  Once Progression:  Resolved Context: not alcohol withdrawal   Recent head injury:  No recent head injuries PTA treatment:  None History of seizures: no    HPI Comments: Jeanne Leach is a 49 y.o. female who presents to the Emergency Department status post seizures that occurred for about 5 minutes while she was sitting on the edge of the bed. She denies that she sensed it coming on. Pt does not remember what occurred during the sz. Her family member reports shaking, lost of control of bladder. The pt reports that she stopped taking Xanax and her daughter adds that she has been on Xanax for 17 years. The pt reports that she was on Clonidine 1 tablets but has started taking twice as much as she used to.   Past Medical History  Diagnosis Date  . Hypertension   . Diabetes mellitus     gestational only  . Degenerative disc disease, cervical   . Degenerative disc disease, lumbar   . Bipolar 1 disorder    Past Surgical History  Procedure Laterality Date  . Cesarean section    . Lipoma excision     No family history on file. History  Substance Use Topics  . Smoking status: Current Every Day Smoker -- 0.50 packs/day  . Smokeless tobacco: Never Used  .  Alcohol Use: No   OB History    No data available     Review of Systems  Constitutional: Negative for fever.  Cardiovascular: Negative for chest pain, palpitations and leg swelling.  Neurological: Positive for seizures. Negative for facial asymmetry, speech difficulty and headaches.  All other systems reviewed and are negative.     Allergies  Codeine  Home Medications   Prior to Admission medications   Medication Sig Start Date End Date Taking? Authorizing Provider  alprazolam Prudy Feeler) 2 MG tablet Take 2 mg by mouth 4 (four) times daily as needed for sleep or anxiety.   Yes Historical Provider, MD  cloNIDine (CATAPRES) 0.1 MG tablet Take 0.1 mg by mouth 2 (two) times daily.   Yes Historical Provider, MD  cloNIDine (CATAPRES) 0.2 MG tablet Take 0.2 mg by mouth 2 (two) times daily.   Yes Historical Provider, MD  Multiple Vitamin (MULTI-VITAMIN PO) Take 1 tablet by mouth daily.   Yes Historical Provider, MD  oxyCODONE-acetaminophen (PERCOCET/ROXICET) 5-325 MG per tablet Take 1 tablet by mouth every 4 (four) hours as needed for severe pain.   Yes Historical Provider, MD  valsartan (DIOVAN) 160 MG tablet Take 160 mg by mouth daily.   Yes Historical Provider, MD  meloxicam (MOBIC) 7.5 MG tablet Take 2 tablets (15 mg total) by mouth daily. 07/12/13   Jeannetta Ellis, PA-C  BP 103/65 mmHg  Pulse 65  Temp(Src) 98.5 F (36.9 C) (Oral)  Resp 14  Ht 5\' 4"  (1.626 m)  Wt 215 lb (97.523 kg)  BMI 36.89 kg/m2  SpO2 97%  LMP 06/02/2014 Physical Exam  Constitutional: She is oriented to person, place, and time. She appears well-developed and well-nourished. No distress.  HENT:  Head: Normocephalic and atraumatic. Head is without raccoon's eyes and without Battle's sign.  Right Ear: No hemotympanum.  Left Ear: No hemotympanum.  Mouth/Throat: Oropharynx is clear and moist.  Eyes: Conjunctivae and EOM are normal. Pupils are equal, round, and reactive to light.  Neck: Normal range of  motion. Neck supple. No tracheal deviation present.  Cardiovascular: Normal rate, regular rhythm, normal heart sounds and intact distal pulses.   Pulmonary/Chest: Effort normal. No respiratory distress. She has no wheezes. She has no rales.  Abdominal: Soft. Bowel sounds are normal. She exhibits no mass. There is no tenderness. There is no rebound and no guarding.  Musculoskeletal: Normal range of motion. She exhibits no edema or tenderness.  Neurological: She is alert and oriented to person, place, and time. She has normal reflexes. No cranial nerve deficit. Coordination normal.  Skin: Skin is warm and dry.  Psychiatric: She has a normal mood and affect. Her behavior is normal.  Nursing note and vitals reviewed.   ED Course  Procedures Labs Review Labs Reviewed  CBC WITH DIFFERENTIAL/PLATELET  POC URINE PREG, ED  I-STAT CHEM 8, ED    Imaging Review No results found.   EKG Interpretation None      EKG Interpretation  Date/Time:  Monday June 23 2014 22:00:25 EST Ventricular Rate:  78 PR Interval:  188 QRS Duration: 94 QT Interval:  386 QTC Calculation: 440 R Axis:   53 Text Interpretation:  Sinus rhythm Confirmed by [facility]  MD, Morene AntuAPRIL ([id number]) on 06/23/2014 11:33:21 PM       MDM   Final diagnoses:  None    See by Dr. Hurman Hornamilio of neurology no need for medication, needs mri with and without if normal d/c with driving restrictions and neurology follow up   I personally performed the services described in this documentation, which was scribed in my presence. The recorded information has been reviewed and is accurate.    610 case d/w Dr. Leroy Kennedyamilo not additional imaging at this time follow up with neurology and they can do epilepsy MRI.  Patient is not allowed to drive until cleared by neurology.  Patient verbalizes understanding and agrees to follow up [name], MD 06/24/14 0259  [name] [name], MD 06/24/14 [id number]

## 2014-06-24 ENCOUNTER — Emergency Department (HOSPITAL_COMMUNITY): Payer: Medicare Other

## 2014-06-24 DIAGNOSIS — R569 Unspecified convulsions: Secondary | ICD-10-CM

## 2014-06-24 LAB — RAPID URINE DRUG SCREEN, HOSP PERFORMED
AMPHETAMINES: NOT DETECTED
BARBITURATES: NOT DETECTED
BENZODIAZEPINES: POSITIVE — AB
COCAINE: NOT DETECTED
OPIATES: POSITIVE — AB
TETRAHYDROCANNABINOL: NOT DETECTED

## 2014-06-24 LAB — ACETAMINOPHEN LEVEL

## 2014-06-24 LAB — SALICYLATE LEVEL: Salicylate Lvl: <4 mg/dL (ref 2.8–20.0)

## 2014-06-24 LAB — POC URINE PREG, ED: PREG TEST UR: NEGATIVE

## 2014-06-24 MED ORDER — LORAZEPAM 2 MG/ML IJ SOLN
1.0000 mg | Freq: Once | INTRAMUSCULAR | Status: AC
  Administered 2014-06-24: 1 mg via INTRAVENOUS
  Filled 2014-06-24: qty 1

## 2014-06-24 MED ORDER — ONDANSETRON HCL 4 MG/2ML IJ SOLN
4.0000 mg | Freq: Once | INTRAMUSCULAR | Status: AC
  Administered 2014-06-24: 4 mg via INTRAVENOUS
  Filled 2014-06-24: qty 2

## 2014-06-24 MED ORDER — GADOBENATE DIMEGLUMINE 529 MG/ML IV SOLN
20.0000 mL | Freq: Once | INTRAVENOUS | Status: AC | PRN
  Administered 2014-06-24: 20 mL via INTRAVENOUS

## 2014-06-24 MED ORDER — KETOROLAC TROMETHAMINE 30 MG/ML IJ SOLN
30.0000 mg | Freq: Once | INTRAMUSCULAR | Status: AC
  Administered 2014-06-24: 30 mg via INTRAVENOUS
  Filled 2014-06-24: qty 1

## 2014-06-24 NOTE — ED Notes (Signed)
Pt still in MRI 

## 2014-06-24 NOTE — ED Notes (Signed)
MD at bedside. 

## 2014-06-24 NOTE — ED Notes (Signed)
Pt informed by the RN and EDP not to drive or operate heavy machinery until cleared by neurologist.

## 2014-06-24 NOTE — ED Notes (Signed)
Patient transported to MRI 

## 2014-06-24 NOTE — ED Notes (Signed)
Neuro at BS.

## 2014-06-24 NOTE — ED Notes (Signed)
Received phone call from MRI, pt very anxious during procedure, unable to continue procedure. See new orders.

## 2014-06-24 NOTE — Discharge Instructions (Signed)
Driving and Equipment Restrictions °Some medical problems make it dangerous to drive, ride a bike, or use machines. Some of these problems are: °· A hard blow to the head (concussion). °· Passing out (fainting). °· Twitching and shaking (seizures). °· Low blood sugar. °· Taking medicine to help you relax (sedatives). °· Taking pain medicines. °· Wearing an eye patch. °· Wearing splints. This can make it hard to use parts of your body that you need to drive safely. °HOME CARE  °· Do not drive until your doctor says it is okay. °· Do not use machines until your doctor says it is okay. °You may need a form signed by your doctor (medical release) before you can drive again. You may also need this form before you do other tasks where you need to be fully alert. °MAKE SURE YOU: °· Understand these instructions. °· Will watch your condition. °· Will get help right away if you are not doing well or get worse. °Document Released: 05/26/2004 Document Revised: 07/11/2011 Document Reviewed: 08/26/2009 °[employer]® Patient Information ©2015 [employer]. This information is not intended to replace advice given to you by your health care provider. Make sure you discuss any questions you have with your health care provider. ° °

## 2014-06-24 NOTE — ED Notes (Signed)
MRI reports unable to carry out test as pt is still very anxious.

## 2014-06-24 NOTE — Consult Note (Signed)
NEURO HOSPITALIST CONSULT NOTE    Reason for Consult: new onset seizure  HPI:                                                                                                                                          Jeanne Leach is an 49 y.o. female with a past medical history significant for HTN, DM, bipolar disorder, DJD on chronic narcotics, brought in by EMS after sustaining a witnessed seizure at home. Her daughter is at the bedside and said that she was sitting on the edge of the bed watching TV when suddenly started having jerking movements of the whole body " like a fish out of the water". She was foaming from her mouth, unable to breath for 2 minutes, incontinent of urine. EMS was summoned and found patient restless, making no sense, confused. Patient takes daily oxycodone and xanax, but denies alcohol intake or use of illicit drugs. No recent fever, infection, or foreign travel. Denies HA, vertigo, double vision, focal weakness or numbness, slurred speech, language or vision impairment. Of note, no history of CNS infections, febrile seizures, severe head trauma, or stroke. No family history of epilepsy. Born full term, no neonatal complications. Normal development. 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   Serologies unimpressive, UDS positive for opiates and benzodiazepines.   Past Medical History  Diagnosis Date  . Hypertension   . Diabetes mellitus     gestational only  . Degenerative disc disease, cervical   . Degenerative disc disease, lumbar   . Bipolar 1 disorder     Past Surgical History  Procedure Laterality Date  . Cesarean section    . Lipoma excision      History reviewed. No pertinent family history.  Family History: no epilepsy, brain tumors, or brain aneurysms.     Social History:  reports that she has been smoking.  She has never used smokeless tobacco. She reports that she does not drink alcohol or use illicit drugs.  Allergies  Allergen Reactions  . Codeine Rash    MEDICATIONS:  I have reviewed the patient's current medications.   ROS:                                                                                                                                       History obtained from the patient, daughter, and chart revview  General ROS: negative for - chills, fatigue, fever, night sweats, or weight loss Psychological ROS: negative for - behavioral disorder, hallucinations, memory difficulties, or suicidal ideation Ophthalmic ROS: negative for - blurry vision, double vision, eye pain or loss of vision ENT ROS: negative for - epistaxis, nasal discharge, oral lesions, sore throat, tinnitus or vertigo Allergy and Immunology ROS: negative for - hives or itchy/watery eyes Hematological and Lymphatic ROS: negative for - bleeding problems, bruising or swollen lymph nodes Endocrine ROS: negative for - galactorrhea, hair pattern changes, polydipsia/polyuria or temperature intolerance Respiratory ROS: negative for - cough, hemoptysis, shortness of breath or wheezing Cardiovascular ROS: negative for - chest pain, dyspnea on exertion, edema or irregular heartbeat Gastrointestinal ROS: negative for - abdominal pain, diarrhea, hematemesis, nausea/vomiting or stool incontinence Genito-Urinary ROS: negative for - dysuria, hematuria, incontinence or urinary frequency/urgency Musculoskeletal ROS: negative for - joint swelling or muscular weakness Neurological ROS: as noted in HPI Dermatological ROS: negative for rash and skin lesion changes   Physical exam: pleasant female in no apparent distress. Blood pressure 97/58, pulse 54,  temperature 98.5 F (36.9 C), temperature source Oral, resp. rate 15, height  (1.626 m), weight 97.523 kg (215 lb), last menstrual period 06/02/2014, SpO2 97 %. Head: normocephalic. Neck: supple, no bruits, no JVD. Cardiac: no murmurs. Lungs: clear. Abdomen: soft, no tender, no mass. Extremities: no edema. Skin: no rash Neurologic Examination:                                                                                                      General: Mental Status: Alert, oriented, thought content appropriate.  Speech fluent without evidence of aphasia.  Able to follow 3 step commands without difficulty. Cranial Nerves: II: Discs flat bilaterally; Visual fields grossly normal, pupils equal, round, reactive to light and accommodation III,IV, VI: ptosis not present, extra-ocular motions intact bilaterally V,VII: smile symmetric, facial light touch sensation normal bilaterally VIII: hearing normal bilaterally IX,X: gag reflex present XI: bilateral shoulder shrug XII: midline tongue extension without atrophy or fasciculations  Motor: Right : Upper extremity   5/5    Left:     Upper extremity   5/5  Lower extremity  5/5     Lower extremity   5/5 Tone and bulk:normal tone throughout; no atrophy noted Sensory: Pinprick and light touch intact throughout, bilaterally Deep Tendon Reflexes:  Right: Upper Extremity   Left: Upper extremity   biceps (C-5 to C-6) 2/4   biceps (C-5 to C-6) 2/4 tricep (C7) 2/4    triceps (C7) 2/4 Brachioradialis (C6) 2/4  Brachioradialis (C6) 2/4  Lower Extremity Lower Extremity  quadriceps (L-2 to L-4) 2/4   quadriceps (L-2 to L-4) 2/4 Achilles (S1) 2/4   Achilles (S1) 2/4  Plantars: Right: downgoing   Left: downgoing Cerebellar: normal finger-to-nose,  normal heel-to-shin test Gait:  No tested for safety reasons  No results found for: CHOL  Results for orders placed or performed during the hospital encounter of 06/23/14 (from the past 48  hour(s))  CBC with Differential/Platelet     Status: Abnormal   Collection Time: 06/23/14 11:30 PM  Result Value Ref Range   WBC 15.9 (H) 4.0 - 10.5 K/uL   RBC 4.30 3.87 - 5.11 MIL/uL   Hemoglobin 13.0 12.0 - 15.0 g/dL   HCT 16.1 09.6 - 04.5 %   MCV 88.6 78.0 - 100.0 fL   MCH 30.2 26.0 - 34.0 pg   MCHC 34.1 30.0 - 36.0 g/dL   RDW 40.9 81.1 - 91.4 %   Platelets 432 (H) 150 - 400 K/uL   Neutrophils Relative % 82 (H) 43 - 77 %   Neutro Abs 13.1 (H) 1.7 - 7.7 K/uL   Lymphocytes Relative 11 (L) 12 - 46 %   Lymphs Abs 1.8 0.7 - 4.0 K/uL   Monocytes Relative 5 3 - 12 %   Monocytes Absolute 0.8 0.1 - 1.0 K/uL   Eosinophils Relative 2 0 - 5 %   Eosinophils Absolute 0.2 0.0 - 0.7 K/uL   Basophils Relative 0 0 - 1 %   Basophils Absolute 0.1 0.0 - 0.1 K/uL  Acetaminophen level     Status: Abnormal   Collection Time: 06/23/14 11:30 PM  Result Value Ref Range   Acetaminophen (Tylenol), Serum <10.0 (L) 10 - 30 ug/mL    Comment:        THERAPEUTIC CONCENTRATIONS VARY SIGNIFICANTLY. A RANGE OF 10-30 ug/mL MAY BE AN EFFECTIVE CONCENTRATION FOR MANY PATIENTS. HOWEVER, SOME ARE BEST TREATED AT CONCENTRATIONS OUTSIDE THIS RANGE. ACETAMINOPHEN CONCENTRATIONS >150 ug/mL AT 4 HOURS AFTER INGESTION AND >50 ug/mL AT 12 HOURS AFTER INGESTION ARE OFTEN ASSOCIATED WITH TOXIC REACTIONS.   Salicylate level     Status: None   Collection Time: 06/23/14 11:30 PM  Result Value Ref Range   Salicylate Lvl <4.0 2.8 - 20.0 mg/dL  I-stat chem 8, ed     Status: Abnormal   Collection Time: 06/23/14 11:42 PM  Result Value Ref Range   Sodium 136 135 - 145 mmol/L   Potassium 3.8 3.5 - 5.1 mmol/L   Chloride 97 96 - 112 mmol/L   BUN 14 6 - 23 mg/dL   Creatinine, Ser 7.82 0.50 - 1.10 mg/dL   Glucose, Bld 956 (H) 70 - 99 mg/dL   Calcium, Ion 2.13 1.12 - 1.23 mmol/L   TCO2 25 0 - 100 mmol/L   Hemoglobin 15.0 12.0 - 15.0 g/dL   HCT 08.6 57.8 - 46.9 %  Drug screen panel, emergency     Status: Abnormal    Collection Time: 06/24/14 12:07 AM  Result Value Ref Range   Opiates POSITIVE (A) NONE DETECTED   Cocaine NONE DETECTED NONE DETECTED  Benzodiazepines POSITIVE (A) NONE DETECTED   Amphetamines NONE DETECTED NONE DETECTED   Tetrahydrocannabinol NONE DETECTED NONE DETECTED   Barbiturates NONE DETECTED NONE DETECTED    Comment:        DRUG SCREEN FOR MEDICAL PURPOSES ONLY.  IF CONFIRMATION IS NEEDED FOR ANY PURPOSE, NOTIFY LAB WITHIN 5 DAYS.        LOWEST DETECTABLE LIMITS FOR URINE DRUG SCREEN Drug Class       Cutoff (ng/mL) Amphetamine      1000 Barbiturate      200 Benzodiazepine   200 Tricyclics       300 Opiates          300 Cocaine          300 THC              50   POC Urine Pregnancy, ED (do NOT [facility] at [facility])     Status: None   Collection Time: 06/24/14 12:16 AM  Result Value Ref Range   Preg Test, Ur NEGATIVE NEGATIVE    Comment:        THE SENSITIVITY OF THIS METHODOLOGY IS >24 mIU/mL     Ct Head Wo Contrast  06/24/2014   CLINICAL DATA:  First time seizure, dizziness with standing for 1 day. History of hypertension and diabetes.  EXAM: CT HEAD WITHOUT CONTRAST  TECHNIQUE: Contiguous axial images were obtained from the base of the skull through the vertex without intravenous contrast.  COMPARISON:  CT of the head February 21, 2012  FINDINGS: The ventricles and sulci are normal. No intraparenchymal hemorrhage, mass effect nor midline shift. No acute large vascular territory infarcts. Subcentimeter faint density within LEFT frontal subcortical white matter, axial 22/33.  No abnormal extra-axial fluid collections. Basal cisterns are patent.  No skull fracture. The included ocular globes and orbital contents are non-suspicious. The mastoid aircells and included paranasal sinuses are well-aerated.  IMPRESSION: No acute intracranial process.  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 If  symptoms persist, recommend follow-up MRI of the brain with contrast.   Electronically Signed   By: Awilda Metroourtnay  Bloomer   On: 06/24/2014 00:48   Assessment/Plan: 49 y/o brought in with new onset isolated GTC seizure. She is back to baseline and neuro-exam is non focal. 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 Suggest MRI brain with and without contrast to better assess CT findings. Defer AED treatment unless MRI confirms heterotopia. Will follow up after MRI.  Wyatt Portelasvaldo Knoxx Boeding, MD 06/24/2014, 3:03 AM

## 2014-08-07 ENCOUNTER — Inpatient Hospital Stay (HOSPITAL_COMMUNITY)
Admission: EM | Admit: 2014-08-07 | Discharge: 2014-08-09 | DRG: 871 | Disposition: A | Discharge status: Home / Self Care | Payer: Medicare Other | Attending: Internal Medicine | Admitting: Internal Medicine

## 2014-08-07 ENCOUNTER — Encounter (HOSPITAL_COMMUNITY): Payer: Self-pay | Admitting: Emergency Medicine

## 2014-08-07 ENCOUNTER — Emergency Department (HOSPITAL_COMMUNITY): Payer: Medicare Other

## 2014-08-07 DIAGNOSIS — Z885 Allergy status to narcotic agent status: Secondary | ICD-10-CM | POA: Diagnosis not present

## 2014-08-07 DIAGNOSIS — Z79891 Long term (current) use of opiate analgesic: Secondary | ICD-10-CM

## 2014-08-07 DIAGNOSIS — I10 Essential (primary) hypertension: Secondary | ICD-10-CM | POA: Diagnosis present

## 2014-08-07 DIAGNOSIS — R6521 Severe sepsis with septic shock: Secondary | ICD-10-CM | POA: Diagnosis present

## 2014-08-07 DIAGNOSIS — I959 Hypotension, unspecified: Secondary | ICD-10-CM | POA: Diagnosis not present

## 2014-08-07 DIAGNOSIS — N179 Acute kidney failure, unspecified: Secondary | ICD-10-CM | POA: Diagnosis present

## 2014-08-07 DIAGNOSIS — E119 Type 2 diabetes mellitus without complications: Secondary | ICD-10-CM

## 2014-08-07 DIAGNOSIS — R569 Unspecified convulsions: Secondary | ICD-10-CM | POA: Diagnosis present

## 2014-08-07 DIAGNOSIS — F319 Bipolar disorder, unspecified: Secondary | ICD-10-CM | POA: Diagnosis present

## 2014-08-07 DIAGNOSIS — F17210 Nicotine dependence, cigarettes, uncomplicated: Secondary | ICD-10-CM | POA: Diagnosis present

## 2014-08-07 DIAGNOSIS — W19XXXA Unspecified fall, initial encounter: Secondary | ICD-10-CM | POA: Diagnosis present

## 2014-08-07 DIAGNOSIS — M1990 Unspecified osteoarthritis, unspecified site: Secondary | ICD-10-CM | POA: Diagnosis present

## 2014-08-07 DIAGNOSIS — N289 Disorder of kidney and ureter, unspecified: Secondary | ICD-10-CM

## 2014-08-07 DIAGNOSIS — N390 Urinary tract infection, site not specified: Secondary | ICD-10-CM | POA: Diagnosis present

## 2014-08-07 DIAGNOSIS — M5030 Other cervical disc degeneration, unspecified cervical region: Secondary | ICD-10-CM | POA: Diagnosis present

## 2014-08-07 DIAGNOSIS — A419 Sepsis, unspecified organism: Principal | ICD-10-CM | POA: Diagnosis present

## 2014-08-07 DIAGNOSIS — R531 Weakness: Secondary | ICD-10-CM | POA: Diagnosis present

## 2014-08-07 DIAGNOSIS — D649 Anemia, unspecified: Secondary | ICD-10-CM | POA: Diagnosis present

## 2014-08-07 LAB — CBC WITH DIFFERENTIAL/PLATELET
BASOS ABS: 0 10*3/uL (ref 0.0–0.1)
BASOS PCT: 0 % (ref 0–1)
EOS ABS: 0 10*3/uL (ref 0.0–0.7)
EOS PCT: 0 % (ref 0–5)
HCT: 33.7 % — ABNORMAL LOW (ref 36.0–46.0)
HEMOGLOBIN: 11 g/dL — AB (ref 12.0–15.0)
LYMPHS ABS: 0.8 10*3/uL (ref 0.7–4.0)
LYMPHS PCT: 9 % — AB (ref 12–46)
MCH: 30.8 pg (ref 26.0–34.0)
MCHC: 32.6 g/dL (ref 30.0–36.0)
MCV: 94.4 fL (ref 78.0–100.0)
MONOS PCT: 5 % (ref 3–12)
Monocytes Absolute: 0.4 10*3/uL (ref 0.1–1.0)
NEUTROS ABS: 7.7 10*3/uL (ref 1.7–7.7)
Neutrophils Relative %: 86 % — ABNORMAL HIGH (ref 43–77)
Platelets: 259 10*3/uL (ref 150–400)
RBC: 3.57 MIL/uL — AB (ref 3.87–5.11)
RDW: 14.9 % (ref 11.5–15.5)
WBC: 8.9 10*3/uL (ref 4.0–10.5)

## 2014-08-07 LAB — COMPREHENSIVE METABOLIC PANEL
ALT: 12 U/L (ref 0–35)
ANION GAP: 14 (ref 5–15)
AST: 22 U/L (ref 0–37)
Albumin: 3.5 g/dL (ref 3.5–5.2)
Alkaline Phosphatase: 43 U/L (ref 39–117)
BILIRUBIN TOTAL: 0.6 mg/dL (ref 0.3–1.2)
BUN: 23 mg/dL (ref 6–23)
CALCIUM: 8.9 mg/dL (ref 8.4–10.5)
CHLORIDE: 99 mmol/L (ref 96–112)
CO2: 21 mmol/L (ref 19–32)
Creatinine, Ser: 2.42 mg/dL — ABNORMAL HIGH (ref 0.50–1.10)
GFR calc non Af Amer: 23 mL/min — ABNORMAL LOW (ref >90)
GFR, EST AFRICAN AMERICAN: 26 mL/min — AB (ref >90)
Glucose, Bld: 145 mg/dL — ABNORMAL HIGH (ref 70–99)
Potassium: 4.7 mmol/L (ref 3.5–5.1)
SODIUM: 134 mmol/L — AB (ref 135–145)
Total Protein: 6.9 g/dL (ref 6.0–8.3)

## 2014-08-07 LAB — URINE MICROSCOPIC-ADD ON

## 2014-08-07 LAB — URINALYSIS, ROUTINE W REFLEX MICROSCOPIC
Bilirubin Urine: NEGATIVE
Glucose, UA: NEGATIVE mg/dL
KETONES UR: NEGATIVE mg/dL
NITRITE: POSITIVE — AB
PROTEIN: 100 mg/dL — AB
Specific Gravity, Urine: 1.017 (ref 1.005–1.030)
Urobilinogen, UA: 1 mg/dL (ref 0.0–1.0)
pH: 5 (ref 5.0–8.0)

## 2014-08-07 LAB — I-STAT CHEM 8, ED
BUN: 26 mg/dL — AB (ref 6–23)
CHLORIDE: 100 mmol/L (ref 96–112)
CREATININE: 2.4 mg/dL — AB (ref 0.50–1.10)
Calcium, Ion: 1.23 mmol/L (ref 1.12–1.23)
GLUCOSE: 143 mg/dL — AB (ref 70–99)
HCT: 36 % (ref 36.0–46.0)
Hemoglobin: 12.2 g/dL (ref 12.0–15.0)
POTASSIUM: 4.8 mmol/L (ref 3.5–5.1)
SODIUM: 137 mmol/L (ref 135–145)
TCO2: 23 mmol/L (ref 0–100)

## 2014-08-07 LAB — I-STAT CG4 LACTIC ACID, ED
Lactic Acid, Venous: 0.93 mmol/L (ref 0.5–2.0)
Lactic Acid, Venous: 1.74 mmol/L (ref 0.5–2.0)

## 2014-08-07 LAB — HCG, QUANTITATIVE, PREGNANCY: HCG, BETA CHAIN, QUANT, S: 1 m[IU]/mL (ref <5)

## 2014-08-07 MED ORDER — SODIUM CHLORIDE 0.9 % IV SOLN
INTRAVENOUS | Status: DC
  Administered 2014-08-07 – 2014-08-08 (×2): via INTRAVENOUS

## 2014-08-07 MED ORDER — DEXTROSE 5 % IV SOLN
1.0000 g | INTRAVENOUS | Status: DC
  Administered 2014-08-07 – 2014-08-08 (×2): 1 g via INTRAVENOUS
  Filled 2014-08-07 (×5): qty 10

## 2014-08-07 MED ORDER — SODIUM CHLORIDE 0.9 % IV BOLUS (SEPSIS)
1000.0000 mL | Freq: Once | INTRAVENOUS | Status: AC
  Administered 2014-08-07: 1000 mL via INTRAVENOUS

## 2014-08-07 NOTE — ED Notes (Signed)
Per MD okay for pt to eat

## 2014-08-07 NOTE — ED Notes (Signed)
Admitting MD at bedside.

## 2014-08-07 NOTE — ED Provider Notes (Addendum)
CSN: [id number]     Arrival date & time 08/07/14  1823 History   First MD Initiated Contact with Patient 08/07/14 1918     Chief Complaint  Patient presents with  . Hypotension     (Consider location/radiation/quality/duration/timing/severity/associated sxs/prior Treatment) The history is provided by the patient.   patient presents after feeling weak. Reportedly has been having multiple falls. Had some confusion was question whether she had a seizure. Patient states she's had a mildly decreased appetite. Denies headache. Denies chest pain. Reportedly has been on oxycodone and Xanax. She's not deformity of this but did tell the pharmacy tech. Found to be hypotensive and brought here.  Past Medical History  Diagnosis Date  . Hypertension   . Diabetes mellitus     gestational only  . Degenerative disc disease, cervical   . Degenerative disc disease, lumbar   . Bipolar 1 disorder    Past Surgical History  Procedure Laterality Date  . Cesarean section    . Lipoma excision     No family history on file. History  Substance Use Topics  . Smoking status: Current Every Day Smoker -- 0.50 packs/day  . Smokeless tobacco: Never Used  . Alcohol Use: No   OB History    No data available     Review of Systems  Constitutional: Positive for appetite change and fatigue. Negative for activity change.  Respiratory: Positive for cough. Negative for chest tightness.   Cardiovascular: Negative for chest pain.  Gastrointestinal: Negative for abdominal pain.  Genitourinary: Negative for flank pain.  Musculoskeletal: Negative for back pain.  Neurological: Positive for seizures.  Hematological: Negative for adenopathy.      Allergies  Codeine  Home Medications   Prior to Admission medications   Medication Sig Start Date End Date Taking? Authorizing Provider  alprazolam Prudy Feeler(XANAX) 2 MG tablet Take 2 mg by mouth 4 (four) times daily.    Yes Historical Provider, MD   Aspirin-Salicylamide-Caffeine (BC HEADACHE POWDER PO) Take 1 Package by mouth daily.   Yes Historical Provider, MD  cloNIDine (CATAPRES) 0.1 MG tablet Take 0.2 mg by mouth 2 (two) times daily.    Yes Historical Provider, MD  meloxicam (MOBIC) 7.5 MG tablet Take 2 tablets (15 mg total) by mouth daily. 07/12/13  Yes Jennifer Piepenbrink, PA-C  Multiple Vitamin (MULTI-VITAMIN PO) Take 1 tablet by mouth daily.   Yes Historical Provider, MD  Oxycodone HCl 20 MG TABS Take 20 mg by mouth as needed (for pain).   Yes Historical Provider, MD  oxyCODONE-acetaminophen (PERCOCET/ROXICET) 5-325 MG per tablet Take 1 tablet by mouth every 4 (four) hours as needed for severe pain.   Yes Historical Provider, MD  valsartan (DIOVAN) 160 MG tablet Take 160 mg by mouth daily.   Yes Historical Provider, MD   BP 90/46 mmHg  Pulse 79  Temp(Src) 98.8 F (37.1 C) (Oral)  Resp 12  Ht 5\' 2"  (1.575 m)  Wt 200 lb (90.719 kg)  BMI 36.57 kg/m2  SpO2 100%  LMP 08/07/2014 Physical Exam  Constitutional: She is oriented to person, place, and time. She appears well-developed.  Mucous membranes are dry.  Eyes: EOM are normal.  Neck: Neck supple.  Pulmonary/Chest:  Mildly diffuse harsh breath sounds.  Abdominal: Soft. There is no tenderness.  Musculoskeletal: Normal range of motion.  Neurological: She is alert and oriented to person, place, and time.  Skin: Skin is warm.    ED Course  Procedures (including critical care time) Labs Review Labs Reviewed  CBC WITH DIFFERENTIAL/PLATELET - Abnormal; Notable for the following:    RBC 3.57 (*)    Hemoglobin 11.0 (*)    HCT 33.7 (*)    Neutrophils Relative % 86 (*)    Lymphocytes Relative 9 (*)    All other components within normal limits  COMPREHENSIVE METABOLIC PANEL - Abnormal; Notable for the following:    Sodium 134 (*)    Glucose, Bld 145 (*)    Creatinine, Ser 2.42 (*)    GFR calc non Af Amer 23 (*)    GFR calc Af Amer 26 (*)    All other components within  normal limits  I-STAT CHEM 8, ED - Abnormal; Notable for the following:    BUN 26 (*)    Creatinine, Ser 2.40 (*)    Glucose, Bld 143 (*)    All other components within normal limits  CULTURE, BLOOD (ROUTINE X 2)  CULTURE, BLOOD (ROUTINE X 2)  URINE CULTURE  HCG, QUANTITATIVE, PREGNANCY  URINALYSIS, ROUTINE W REFLEX MICROSCOPIC  I-STAT CG4 LACTIC ACID, ED    Imaging Review Dg Chest Portable 1 View  08/07/2014   CLINICAL DATA:  Bipolar disorder.  Hypotension today.  EXAM: PORTABLE CHEST - 1 VIEW  COMPARISON:  March 03, 2006  FINDINGS: The heart size and mediastinal contours are within normal limits. There is no focal infiltrate, pulmonary edema, or pleural effusion. The visualized skeletal structures are unremarkable.  IMPRESSION: No active cardiopulmonary disease.   Electronically Signed   By: Sherian Rein M.D.   On: 08/07/2014 20:59     EKG Interpretation   Date/Time:  Thursday August 07 2014 19:05:11 EDT Ventricular Rate:  102 PR Interval:  148 QRS Duration: 76 QT Interval:  346 QTC Calculation: 450 R Axis:   32 Text Interpretation:  Sinus tachycardia Cannot rule out Anterior infarct ,  age undetermined Abnormal ECG Confirmed by Rubin Payor  MD, Harrold Donath [phone])  on 08/07/2014 7:26:05 PM      MDM   Final diagnoses:  Hypotension, unspecified hypotension type  Renal insufficiency    Patient with hypotension. Somewhat persistent after 3 L of fluid. Normal lactic acid and no white count elevation. Urine pending at this time. Has reportedly also been using more benzos and opiates that she should. Will admit to internal medicine to a step down bed pending stability of her vitals.    Benjiman Core, MD 08/07/14 2319  CRITICAL CARE Performed by: Billee Cashing Total critical care time: 30 Critical care time was exclusive of separately billable procedures and treating other patients. Critical care was necessary to treat or prevent imminent or life-threatening  deterioration. Critical care was time spent personally by me on the following activities: development of treatment plan with patient and/or surrogate as well as nursing, discussions with consultants, evaluation of patient's response to treatment, examination of patient, obtaining history from patient or surrogate, ordering and performing treatments and interventions, ordering and review of laboratory studies, ordering and review of radiographic studies, pulse oximetry and re-evaluation of patient's condition.   Benjiman Core, MD 08/07/14 [phone]

## 2014-08-07 NOTE — ED Notes (Signed)
2L NS started per verbal orders from Dr. Clyde LundborgNiu.

## 2014-08-07 NOTE — ED Notes (Signed)
Pt O2 sat dropped to 78% - pt placed on 3L [place] - O2 sat now 99%.

## 2014-08-07 NOTE — ED Notes (Signed)
Pt placed in Trendelenburg

## 2014-08-07 NOTE — ED Notes (Signed)
Pt to ED, family st's pt had a seizure, but was not witnessed.  Pt's mother reports someone called her because pt was not acting appropriately.  Pt alert and oriented x's 3 today.

## 2014-08-08 DIAGNOSIS — R6521 Severe sepsis with septic shock: Secondary | ICD-10-CM

## 2014-08-08 DIAGNOSIS — A419 Sepsis, unspecified organism: Secondary | ICD-10-CM | POA: Diagnosis present

## 2014-08-08 DIAGNOSIS — N390 Urinary tract infection, site not specified: Secondary | ICD-10-CM

## 2014-08-08 DIAGNOSIS — I959 Hypotension, unspecified: Secondary | ICD-10-CM

## 2014-08-08 LAB — CORTISOL: Cortisol, Plasma: 5.8 ug/dL

## 2014-08-08 LAB — GLUCOSE, CAPILLARY
GLUCOSE-CAPILLARY: 148 mg/dL — AB (ref 70–99)
Glucose-Capillary: 109 mg/dL — ABNORMAL HIGH (ref 70–99)
Glucose-Capillary: 97 mg/dL (ref 70–99)
Glucose-Capillary: 123 mg/dL — ABNORMAL HIGH (ref 70–99)
Glucose-Capillary: 112 mg/dL — ABNORMAL HIGH (ref 70–99)
Glucose-Capillary: 110 mg/dL — ABNORMAL HIGH (ref 70–99)

## 2014-08-08 LAB — CBC
HCT: 26.1 % — ABNORMAL LOW (ref 36.0–46.0)
HEMATOCRIT: 28.1 % — AB (ref 36.0–46.0)
Hemoglobin: 8.5 g/dL — ABNORMAL LOW (ref 12.0–15.0)
Hemoglobin: 9.1 g/dL — ABNORMAL LOW (ref 12.0–15.0)
MCH: 30.9 pg (ref 26.0–34.0)
MCH: 31.1 pg (ref 26.0–34.0)
MCHC: 32.6 g/dL (ref 30.0–36.0)
MCHC: 32.4 g/dL (ref 30.0–36.0)
MCV: 94.9 fL (ref 78.0–100.0)
MCV: 95.9 fL (ref 78.0–100.0)
Platelets: 211 K/uL (ref 150–400)
Platelets: 227 10*3/uL (ref 150–400)
RBC: 2.75 MIL/uL — ABNORMAL LOW (ref 3.87–5.11)
RBC: 2.93 MIL/uL — AB (ref 3.87–5.11)
RDW: 15 % (ref 11.5–15.5)
RDW: 15.2 % (ref 11.5–15.5)
WBC: 6.9 K/uL (ref 4.0–10.5)
WBC: 9 10*3/uL (ref 4.0–10.5)

## 2014-08-08 LAB — PROTIME-INR
INR: 1.1 (ref 0.00–1.49)
Prothrombin Time: 14.4 seconds (ref 11.6–15.2)

## 2014-08-08 LAB — COMPREHENSIVE METABOLIC PANEL WITH GFR
ALT: 10 U/L (ref 0–35)
AST: 15 U/L (ref 0–37)
Albumin: 2.6 g/dL — ABNORMAL LOW (ref 3.5–5.2)
Alkaline Phosphatase: 30 U/L — ABNORMAL LOW (ref 39–117)
Anion gap: 7 (ref 5–15)
BUN: 20 mg/dL (ref 6–23)
CO2: 25 mmol/L (ref 19–32)
Calcium: 7.7 mg/dL — ABNORMAL LOW (ref 8.4–10.5)
Chloride: 106 mmol/L (ref 96–112)
Creatinine, Ser: 1.82 mg/dL — ABNORMAL HIGH (ref 0.50–1.10)
GFR calc Af Amer: 37 mL/min — ABNORMAL LOW
GFR calc non Af Amer: 32 mL/min — ABNORMAL LOW
Glucose, Bld: 118 mg/dL — ABNORMAL HIGH (ref 70–99)
Potassium: 4.3 mmol/L (ref 3.5–5.1)
Sodium: 138 mmol/L (ref 135–145)
Total Bilirubin: 0.2 mg/dL — ABNORMAL LOW (ref 0.3–1.2)
Total Protein: 5.1 g/dL — ABNORMAL LOW (ref 6.0–8.3)

## 2014-08-08 LAB — BASIC METABOLIC PANEL
Anion gap: 5 (ref 5–15)
BUN: 20 mg/dL (ref 6–23)
CALCIUM: 7.6 mg/dL — AB (ref 8.4–10.5)
CO2: 21 mmol/L (ref 19–32)
CREATININE: 1.53 mg/dL — AB (ref 0.50–1.10)
Chloride: 113 mmol/L — ABNORMAL HIGH (ref 96–112)
GFR, EST AFRICAN AMERICAN: 46 mL/min — AB (ref >90)
GFR, EST NON AFRICAN AMERICAN: 39 mL/min — AB (ref >90)
Glucose, Bld: 127 mg/dL — ABNORMAL HIGH (ref 70–99)
Potassium: 4.2 mmol/L (ref 3.5–5.1)
SODIUM: 139 mmol/L (ref 135–145)

## 2014-08-08 LAB — SODIUM, URINE, RANDOM: SODIUM UR: 45 mmol/L

## 2014-08-08 LAB — LACTIC ACID, PLASMA: LACTIC ACID, VENOUS: 0.9 mmol/L (ref 0.5–2.0)

## 2014-08-08 LAB — PROCALCITONIN: Procalcitonin: <0.1 ng/mL

## 2014-08-08 LAB — APTT: APTT: 32 s (ref 24–37)

## 2014-08-08 LAB — CREATININE, URINE, RANDOM: Creatinine, Urine: 217.34 mg/dL

## 2014-08-08 LAB — MRSA PCR SCREENING: MRSA by PCR: NEGATIVE

## 2014-08-08 MED ORDER — SODIUM CHLORIDE 0.9 % IV SOLN: 250.0000 mL | INTRAVENOUS | Status: DC | PRN

## 2014-08-08 MED ORDER — OXYCODONE-ACETAMINOPHEN 5-325 MG PO TABS
1.0000 | ORAL_TABLET | ORAL | Status: DC | PRN
  Administered 2014-08-08 – 2014-08-09 (×4): 1 via ORAL
  Filled 2014-08-08 (×3): qty 1

## 2014-08-08 MED ORDER — ALPRAZOLAM 0.5 MG PO TABS
1.0000 mg | ORAL_TABLET | Freq: Three times a day (TID) | ORAL | Status: DC | PRN
  Administered 2014-08-09 (×2): 1 mg via ORAL
  Filled 2014-08-08 (×2): qty 2

## 2014-08-08 MED ORDER — NICOTINE 14 MG/24HR TD PT24
14.0000 mg | MEDICATED_PATCH | Freq: Every day | TRANSDERMAL | Status: DC
  Administered 2014-08-08 – 2014-08-09 (×2): 14 mg via TRANSDERMAL
  Filled 2014-08-08 (×3): qty 1

## 2014-08-08 MED ORDER — NOREPINEPHRINE BITARTRATE 1 MG/ML IV SOLN
2.0000 ug/min | INTRAVENOUS | Status: DC
  Filled 2014-08-08: qty 4

## 2014-08-08 MED ORDER — ADULT MULTIVITAMIN W/MINERALS CH
1.0000 | ORAL_TABLET | Freq: Every day | ORAL | Status: DC
  Administered 2014-08-08 – 2014-08-09 (×2): 1 via ORAL
  Filled 2014-08-08 (×3): qty 1

## 2014-08-08 MED ORDER — ASPIRIN 81 MG PO CHEW: 324.0000 mg | CHEWABLE_TABLET | ORAL | Status: DC

## 2014-08-08 MED ORDER — SODIUM CHLORIDE 0.9 % IV BOLUS (SEPSIS)
1000.0000 mL | Freq: Once | INTRAVENOUS | Status: AC
  Administered 2014-08-08: 1000 mL via INTRAVENOUS

## 2014-08-08 MED ORDER — HEPARIN SODIUM (PORCINE) 5000 UNIT/ML IJ SOLN
5000.0000 [IU] | Freq: Three times a day (TID) | INTRAMUSCULAR | Status: DC
  Administered 2014-08-08 – 2014-08-09 (×3): 5000 [IU] via SUBCUTANEOUS
  Filled 2014-08-08 (×2): qty 1

## 2014-08-08 MED ORDER — SODIUM CHLORIDE 0.9 % IV SOLN: INTRAVENOUS | Status: DC

## 2014-08-08 MED ORDER — ONDANSETRON HCL 4 MG/2ML IJ SOLN: 4.0000 mg | Freq: Four times a day (QID) | INTRAMUSCULAR | Status: DC | PRN

## 2014-08-08 MED ORDER — DEXTROSE 5 % IV SOLN
0.0000 ug/min | INTRAVENOUS | Status: DC
  Administered 2014-08-08: 120 ug/min via INTRAVENOUS
  Administered 2014-08-08: 130 ug/min via INTRAVENOUS
  Filled 2014-08-08 (×3): qty 1

## 2014-08-08 MED ORDER — PHENYLEPHRINE HCL 10 MG/ML IJ SOLN
0.0000 ug/min | Freq: Once | INTRAVENOUS | Status: AC
  Administered 2014-08-08: 20 ug/min via INTRAVENOUS
  Filled 2014-08-08 (×2): qty 1

## 2014-08-08 MED ORDER — HEPARIN SODIUM (PORCINE) 5000 UNIT/ML IJ SOLN: 5000.0000 [IU] | Freq: Three times a day (TID) | INTRAMUSCULAR | Status: DC

## 2014-08-08 MED ORDER — ALUM & MAG HYDROXIDE-SIMETH 200-200-20 MG/5ML PO SUSP
30.0000 mL | Freq: Four times a day (QID) | ORAL | Status: DC | PRN
  Filled 2014-08-08: qty 30

## 2014-08-08 MED ORDER — SODIUM CHLORIDE 0.9 % IJ SOLN
3.0000 mL | Freq: Two times a day (BID) | INTRAMUSCULAR | Status: DC
  Administered 2014-08-08 – 2014-08-09 (×2): 3 mL via INTRAVENOUS

## 2014-08-08 MED ORDER — VANCOMYCIN HCL 10 G IV SOLR
1250.0000 mg | INTRAVENOUS | Status: DC
  Filled 2014-08-08: qty 1250

## 2014-08-08 MED ORDER — INSULIN ASPART 100 UNIT/ML ~~LOC~~ SOLN
0.0000 [IU] | Freq: Three times a day (TID) | SUBCUTANEOUS | Status: DC
  Administered 2014-08-08: 09:00:00 via SUBCUTANEOUS

## 2014-08-08 MED ORDER — ONDANSETRON HCL 4 MG PO TABS: 4.0000 mg | ORAL_TABLET | Freq: Four times a day (QID) | ORAL | Status: DC | PRN

## 2014-08-08 MED ORDER — ASPIRIN 300 MG RE SUPP: 300.0000 mg | RECTAL | Status: DC

## 2014-08-08 MED ORDER — LORAZEPAM 2 MG/ML IJ SOLN: 0.5000 mg | INTRAMUSCULAR | Status: DC | PRN

## 2014-08-08 NOTE — Progress Notes (Signed)
UR Completed.  [phone]  

## 2014-08-08 NOTE — H&P (Signed)
PULMONARY  / CRITICAL CARE MEDICINE  Name: Jeanne Leach MRN: [id number] DOB: 1965-12-12    LOS: 1  CHIEF COMPLAINT:  Weakness, dizziness  BRIEF PATIENT DESCRIPTION: Jeanne Leach is a 49 yo female with PMHx of HTN, Bipolar Disorder, and DJD who presented with dizziness, weakness, confusion and was found to be hypotensive with a positive UTI.   LINES / TUBES: Peripheral IV  CULTURES: BCx 4/7>> UCx 4/7>>  ANTIBIOTICS: Ceftriaxone 4/7>>  SIGNIFICANT EVENTS:  4/8 Admitted to PCCM for requirement of Neo-synephrine for BP support  LEVEL OF CARE:  ICU PRIMARY SERVICE:  PCCM CONSULTANTS:  None CODE STATUS: FULL DIET:  Regular DVT Px:  Heparin TID  HISTORY OF PRESENT ILLNESS:  Jeanne Leach is a 49 yo female with PMHx of HTN, Bipolar Disorder, and DJD who presented with dizziness, weakness, and confusion. Patient states 2 days ago she developed chills and weakness. She also reports "not walking right," dizziness upon standing, but also present while laying down, requiring her to hold onto the walls as she walked down the hallway. She fell twice without hitting her head, but scraped both knees. The following day, patient felt slightly better then the day prior, but that evening, the patient's mother and daughter found her at home and stated she was acting confused and not asking inappropriate questions regarding events that had not happened or that had happened a long time ago. They did report slurred speech. Per the family, patient had a witnessed seizure one month ago when she was shaking diffusely at home. [facility] was [facility] for evaluation and seen by Dr. Leroy Kennedyamilo in the ED. It was felt at that time that there was no need for medication and patient was okay to discharge home with follow up with neurology and driving restrictions.   Patient admits to chills, weakness, dizziness, and decreased appetite. She denies fever, headache, vision changes, changes in hearing, confusion,  chest pain, shortness of breath, cough, nausea, vomiting, abdominal pain, diarrhea or constipation. Patient denies dysuria, urinary urgency, frequency, change in color or odor. She denies increased back pain above her normal.   Patient smokes 1/2-1 ppd for 30 years. She denies alcohol or illicit drug use.   PAST MEDICAL HISTORY :  Past Medical History  Diagnosis Date  . Hypertension   . Diabetes mellitus     gestational only  . Degenerative disc disease, cervical   . Degenerative disc disease, lumbar   . Bipolar 1 disorder    Past Surgical History  Procedure Laterality Date  . Cesarean section    . Lipoma excision     Prior to Admission medications   Medication Sig Start Date End Date Taking? Authorizing Provider  alprazolam Prudy Feeler(XANAX) 2 MG tablet Take 2 mg by mouth 4 (four) times daily.    Yes Historical Provider, MD  Aspirin-Salicylamide-Caffeine (BC HEADACHE POWDER PO) Take 1 Package by mouth daily.   Yes Historical Provider, MD  cloNIDine (CATAPRES) 0.1 MG tablet Take 0.2 mg by mouth 2 (two) times daily.    Yes Historical Provider, MD  meloxicam (MOBIC) 7.5 MG tablet Take 2 tablets (15 mg total) by mouth daily. 07/12/13  Yes Jennifer Piepenbrink, PA-C  Multiple Vitamin (MULTI-VITAMIN PO) Take 1 tablet by mouth daily.   Yes Historical Provider, MD  Oxycodone HCl 20 MG TABS Take 20 mg by mouth as needed (for pain).   Yes Historical Provider, MD  oxyCODONE-acetaminophen (PERCOCET/ROXICET) 5-325 MG per tablet Take 1 tablet by mouth every 4 (  four) hours as needed for severe pain.   Yes Historical Provider, MD  valsartan (DIOVAN) 160 MG tablet Take 160 mg by mouth daily.   Yes Historical Provider, MD   Allergies  Allergen Reactions  . Codeine Rash    FAMILY HISTORY:  No family history on file. SOCIAL HISTORY:  reports that she has been smoking.  She has never used smokeless tobacco. She reports that she does not drink alcohol or use illicit drugs.  REVIEW OF SYSTEMS:   General:  Admits to chills, fatigue, decreased appetite. Denies fever, and diaphoresis.  Respiratory: Denies SOB, cough, chest tightness  Cardiovascular: Denies chest pain and palpitations.  Gastrointestinal: Denies nausea, vomiting, abdominal pain, diarrhea, constipation Genitourinary: Denies dysuria, urgency, frequency, suprapubic pain and flank pain. Musculoskeletal: Admits to chronic back pain and chronic right knee pain.   Neurological: Admits to dizziness, weakness, lightheadedness. Denies headaches, numbness, seizures, and syncope. Psychiatric/Behavioral: Admits to anxiety. Denies confusion.  PHYSICAL EXAMINATION: Filed Vitals:   08/08/14 0240 08/08/14 0245 08/08/14 0315 08/08/14 0350  BP: 114/60 98/55 100/51 96/51  Pulse: 79 70 77 78  Temp:      TempSrc:      Resp: 19 10 13 10   Height:      Weight:      SpO2: 100% 100% 97% 91%   General: Vital signs reviewed.  Patient is well-developed and well-nourished, in no acute distress and cooperative with exam.  Head: Normocephalic and atraumatic. Eyes: EOMI, conjunctivae normal, no scleral icterus, PERRL.  Neck: Supple, trachea midline, normal ROM.  Cardiovascular: RRR, S1 normal, S2 normal, no murmurs, gallops, or rubs. Pulmonary/Chest: Coarse breath sounds, no wheezes, rales, or rhonchi. Abdominal: Soft, non-tender, non-distended, BS +, no suprapubic tenderness or guarding present.  GU: Negative Lloyd's sign. Extremities: No lower extremity edema bilaterally.   Skin: Scrape on left knee. Psychiatric: Normal mood and affect. speech and behavior is normal. Cognition and memory are normal.   Neurologic Exam:   Mental Status: Alert, oriented, thought content appropriate.  Speech fluent without evidence of aphasia. Able to follow 3 step commands without difficulty.  Cranial Nerves:   II: Visual fields grossly intact.  III/IV/VI: Extraocular movements intact. Pupils reactive bilaterally.  V/VII: Smile symmetric. facial light touch sensation  normal bilaterally.  VIII: Grossly intact.  IX/X: Normal gag.  XI: Bilateral shoulder shrug normal.  XII: Midline tongue extension normal.  Motor:  5/5 bilaterally with normal tone and bulk, except 3/5 in RLE (possibly poor effort)  Sensory:  Pinprick and light touch intact throughout, bilaterally  Cerebellar: Normal finger-to-nose, normal rapid alternating movements.   LABS: CBC  Recent Labs Lab 08/07/14 1912 08/07/14 1930 08/08/14 0145  WBC 8.9  --  6.9  HGB 11.0* 12.2 8.5*  HCT 33.7* 36.0 26.1*  PLT 259  --  211   Chemistry  Recent Labs Lab 08/07/14 1912 08/07/14 1930 08/08/14 0145  NA 134* 137 138  K 4.7 4.8 4.3  CL 99 100 106  CO2 21  --  25  BUN 23 26* 20  CREATININE 2.42* 2.40* 1.82*  CALCIUM 8.9  --  7.7*  GLUCOSE 145* 143* 118*   Liver Function  Recent Labs Lab 08/07/14 1912 08/08/14 0145  AST 22 15  ALT 12 10  ALKPHOS 43 30*  BILITOT 0.6 0.2*  PROT 6.9 5.1*  ALBUMIN 3.5 2.6*   Coags   Recent Labs Lab 08/08/14 0145  APTT 32  INR 1.10   Sepsis markers  Recent Labs Lab 08/07/14 1934  08/07/14 2348 08/08/14 0145  LATICACIDVEN 1.74 0.93 0.9  PROCALCITON  --   --  <0.10   ABG  Recent Labs Lab 08/07/14 1930  TCO2 23   IMAGING/TESTS: CXR 4/7>> No active cardiopulmonary disease. Urinalysis 4/7>> Brown, cloudy, negative ketones, positive nitrites, moderate leukocytes, large hemoglobin, WBC 21-50, RBC TNTC, many squams, many bacteria.  ECG: EKG 4/7>> sinus tachycardia  DIAGNOSES: Principal Problem:   Hypotension Active Problems:   Hypertension   Degenerative disc disease, cervical   Bipolar 1 disorder   Diabetes mellitus without complication   Fall   AKI (acute kidney injury)   ASSESSMENT / PLAN:  PULMONARY  ASSESSMENT: No active issues: CXR clear, satting well on room air Tobacco abuse PLAN:   Nicotine patch QD  CARDIOVASCULAR  ASSESSMENT:  Hypotension-in the setting of urosepsis, received NS 1 L bolus x 3 H/o  Hypertension PLAN:  NS 150 cc/hr continuous Neo-synephrine gtt Hold home antihypertensives (clonidine 0.2 mg BID and valsartan 160 mg QD) ASA 325 mg once Telemetry Admit to MICU  RENAL  ASSESSMENT:   AKI: Creatinine 2.42 on admission, baseline 0.6-0.7, likely secondary to urosepsis PLAN:   NS 150 cc/hr continuous Check urine creatine and urine sodium for FENa calculation I/Os Repeat BMET tomorrow am  GASTROINTESTINAL  ASSESSMENT:   No active issues PLAN:   Regular diet I/Os  HEMATOLOGIC  ASSESSMENT:   Normocytic Anemia: Hgb 11.0>8.5 on repeat after 3L bolus, baseline 12.  PLAN:  Repeat CBC tomorrow am  INFECTIOUS  ASSESSMENT:   Urosepsis: Positive urinalysis, however contaminated. Asymptomatic. Afebrile, no leukocytosis, however tachycardic, hypotensive despite NS 3 L bolus, and tachypneic on presentation. Procalcitonin <0.10. Venous lactic acid 0.93.  PLAN:   Admit to MICU Ceftriaxone  Neosynephrine gtt Repeat UA UCx pending BCx pending Lactic acid Q3H x 2 Repeat CBC tomorrow am  ENDOCRINE  ASSESSMENT:   Questionable history of Diabetes, glucose 145 via CBG on admission PLAN:   HgbA1c SSI-S CBG AC/HS Regular diet, consider switching to carb modified  NEUROLOGIC  ASSESSMENT:   Questionable history of seizure/post-ictal state-found confused, no witnessed seizure, normal neurological examination, AAOx3 H/o of seizure in February 2016 PLAN:   Bed rest Seizure precautions Neuro checks Consider consulting Neurology if deemed necessary  CLINICAL SUMMARY: Jeanne Leach is a 49 yo female with PMHx of HTN, Bipolar Disorder, DJD, and history of Seizure who presented to the ED with confusion, dizziness, and decreased appetite. Patient was found to have a UTI and refractory hypotension despite NS 3L boluses likely secondary to urosepsis.   I have personally obtained a history, examined the patient, evaluated laboratory and imaging results, formulated the  assessment and plan and placed orders.  Jill Alexanders, DO PGY-1 Internal Medicine Resident Pager # [phone] 08/08/2014 4:01 AM   STAFF NOTE: Cindi Carbon, MD FACP have personally reviewed patient's available data, including medical history, events of note, physical examination and test results as part of my evaluation. I have discussed with resident/NP and other care providers such as pharmacist, RN and RRT. In addition, I personally evaluated patient and elicited key findings of: appears well,no abdo pain, CTA still after 5 liters, lactic wnl re assuring, neo at 110 mics but diastolic 70, map improving, if remains on pressors in 6 hr may need line cvp, assess renal US r/o stone, hydro, crt improved making hydro less likely, also recently had HTN Cardizem increased, may be a contributor, add vanc with continued pressor needs, continued ceftriaxone, bmet in am needed, also if remains  on neo, will need echo assessment, i updated pt and soon in law at bedside The patient is critically ill with multiple organ systems failure and requires high complexity decision making for assessment and support, frequent evaluation and titration of therapies, application of advanced monitoring technologies and extensive interpretation of multiple databases.   Critical Care Time devoted to patient care services described in this note is30 Minutes. This time reflects time of care of this signee: Rory Percy, MD FACP. This critical care time does not reflect procedure time, or teaching time or supervisory time of PA/NP/Med student/Med Resident etc but could involve care discussion time. Rest per NP/medical resident whose note is outlined above and that I agree with   Mcarthur Rossetti. Tyson Alias, MD, FACP Pgr: [phone] [facility] 08/08/2014 5:42 AM

## 2014-08-08 NOTE — Progress Notes (Signed)
PT Cancellation Note  Patient Details Name: Jeanne HinesDeborah Leach MRN: [id number] DOB: 1966/02/06   Cancelled Treatment:    Reason Eval/Treat Not Completed: Patient declined, no reason specified. Pt states that she does not need PT. RN present and educated pt on probable reasoning behind PT consult. Pt encouraged to participated however continued to refuse. Will sign off at this time - if needs change please re-consult.   Conni SlipperKirkman, Tonio Seider 08/08/2014, 12:10 PM   Conni SlipperLaura Evann Koelzer, PT, DPT Acute Rehabilitation Services Pager: [phone]

## 2014-08-08 NOTE — ED Notes (Signed)
Admitting MD at bedside.

## 2014-08-08 NOTE — Progress Notes (Addendum)
ANTIBIOTIC CONSULT NOTE - INITIAL  Pharmacy Consult for Ceftriaxone  (Addendum: add Vancomycin) Indication: UTI  Allergies  Allergen Reactions  . Codeine Rash    Patient Measurements: Height: 5\' 2"  (157.5 cm) Weight: 200 lb (90.719 kg) IBW/kg (Calculated) : 50.1   Vital Signs: Temp: 98.8 F (37.1 C) (04/07 1858) Temp Source: Oral (04/07 1858) BP: 100/51 mmHg (04/08 0315) Pulse Rate: 77 (04/08 0315) Intake/Output from previous day:   Intake/Output from this shift:    Labs:  Recent Labs  08/07/14 1912 08/07/14 1930 08/08/14 0145  WBC 8.9  --  6.9  HGB 11.0* 12.2 8.5*  PLT 259  --  211  CREATININE 2.42* 2.40* 1.82*   Estimated Creatinine Clearance: 40 mL/min (by C-G formula based on Cr of 1.82). No results for input(s): VANCOTROUGH, VANCOPEAK, VANCORANDOM, GENTTROUGH, GENTPEAK, GENTRANDOM, TOBRATROUGH, TOBRAPEAK, TOBRARND, AMIKACINPEAK, AMIKACINTROU, AMIKACIN in the last 72 hours.   Microbiology: No results found for this or any previous visit (from the past 720 hour(s)).  Medical History: Past Medical History  Diagnosis Date  . Hypertension   . Diabetes mellitus     gestational only  . Degenerative disc disease, cervical   . Degenerative disc disease, lumbar   . Bipolar 1 disorder     Medications:  Scheduled:  . heparin  5,000 Units Subcutaneous 3 times per day  . insulin aspart  0-9 Units Subcutaneous TID WC  . multivitamin with minerals  1 tablet Oral Daily  . nicotine  14 mg Transdermal Daily  . sodium chloride  3 mL Intravenous Q12H   Assessment: 49 yo female with PMHx of HTN, Bipolar Disorder, and DJD who presented with dizziness, weakness, confusion and was found to be hypotensive with a positive UTI. MD started patient on ceftriaxone 1g IV q24h.  Pharmacy consulted to dose ceftriaxone. Current dose is appropriate for UTI.  Ceftriaxone does not require adjustment for renal insufficiency.   Plan:  Continue ceftriaxone 1 gm IV q24h Ceftriaxone  does not require adjustment for changes in renal function. Pharmacy will sign off.   Noah Delaineuth Maxim Bedel, RPh Clinical Pharmacist Pager: [phone] 08/08/2014,3:47 AM   Addendum:   Pharmacy consult to dose Vancomycin for urine shock.   Goal of Therapy:  Vancomycin trough level 10-15 mcg/ml  Plan:  Vancomycin 1250 mg IV q24h Monitor clinical status, renal function and culture results.  Vancomycin trough at steady state.  Noah Delaineuth Chiyo Fay, RPh Clinical Pharmacist Pager: [phone] 08/08/2014,6:00 AM

## 2014-08-08 NOTE — Progress Notes (Signed)
Report called to receiving nurse via phone. Past medical history and recent hospitalization reported to receiving nurse. All questions answered. Patient transported to [address]N02 via wheelchair with all belongings. Patient states she will notify family of transfer when she arrives to new room.

## 2014-08-08 NOTE — H&P (Deleted)
PULMONARY  / CRITICAL CARE MEDICINE  Name: Jeanne Leach MRN: [id number] DOB: July 05, 1965    LOS: 1  CHIEF COMPLAINT:  Weakness, dizziness  BRIEF Jeanne Leach DESCRIPTION: Jeanne Leach is a 49 yo female with PMHx of HTN, Bipolar Disorder, and DJD who presented with dizziness, weakness, confusion and was found to be hypotensive with a positive UTI.   LINES / TUBES: Peripheral IV  CULTURES: BCx 4/7>> UCx 4/7>>  ANTIBIOTICS: Ceftriaxone 4/7>>  SIGNIFICANT EVENTS:  4/8 Admitted to PCCM for requirement of Neo-synephrine for BP support  MAGING/TESTS: CXR 4/7>> No active cardiopulmonary disease. Urinalysis 4/7>> Brown, cloudy, negative ketones, positive nitrites, moderate leukocytes, large hemoglobin, WBC 21-50, RBC TNTC, many squams, many bacteria.  ECG: EKG 4/7>> sinus tachycardia  HISTORY OF PRESENT ILLNESS:  Jeanne Leach is a 48 yo female with PMHx of HTN, Bipolar Disorder, and DJD who presented with dizziness, weakness, and confusion. Jeanne Leach states 2 days ago she developed chills and weakness. She also reports "not walking right," dizziness upon standing, but also present while laying down, requiring her to hold onto the walls as she walked down the hallway. She fell twice without hitting her head, but scraped both knees. The following day, Jeanne Leach felt slightly better then the day prior, but that evening, the Jeanne Leach's mother and daughter found her at home and stated she was acting confused and not asking inappropriate questions regarding events that had not happened or that had happened a long time ago. They did report slurred speech. Per the family, Jeanne Leach had a witnessed seizure one month ago when she was shaking diffusely at home. She was brought to [facility] on 06/23/14 for evaluation and seen by Dr. Leroy Kennedy in the ED. It was felt at that time that there was no need for medication and Jeanne Leach was okay to discharge home with follow up with neurology and driving restrictions.   Jeanne Leach  admits to chills, weakness, dizziness, and decreased appetite. She denies fever, headache, vision changes, changes in hearing, confusion, chest pain, shortness of breath, cough, nausea, vomiting, abdominal pain, diarrhea or constipation. Jeanne Leach denies dysuria, urinary urgency, frequency, change in color or odor. She denies increased back pain above her normal.   Jeanne Leach smokes 1/2-1 ppd for 30 years. She denies alcohol or illicit drug use.   SUBJECTIVE:  Remains afebrile Still on pressor Received 6L NS bolus overnight    PHYSICAL EXAMINATION: Filed Vitals:   08/08/14 0510 08/08/14 0515 08/08/14 0530 08/08/14 0607  BP: 103/70  Pulse: 63 60 63 58  Temp:      TempSrc:      Resp: Height:     (1.575 m)  Weight:    210 lb 12.2 oz (95.6 kg)  SpO2: 99% 92% 100% 100%   General: Acutely ill woman Neuro: Alert, follows commands HEENT: mm pink/moist, no jvd Cardiovascular: S1s2, regular rate Lungs: clear to auscultation bilaterally Abdomen: Round, non distended, soft, BS+ Musculoskeletal: No acute deformities  Skin: Intact  LABS: CBC  Recent Labs Lab 08/07/14 1912 08/07/14 1930 08/08/14 0145  WBC 8.9  --  6.9  HGB 11.0* 12.2 8.5*  HCT 33.7* 36.0 26.1*  PLT 259  --  211   Chemistry  Recent Labs Lab 08/07/14 1912 08/07/14 1930 08/08/14 0145  NA 134* 137 138  K 4.7 4.8 4.3  CL 99 100 106  CO2 21  --  25  BUN 23 26* 20  CREATININE 2.42* 2.40* 1.82*  CALCIUM 8.9  --  7.7*  GLUCOSE 145* 143* 118*   Liver Function  Recent Labs Lab 08/07/14 1912 08/08/14 0145  AST 22 15  ALT 12 10  ALKPHOS 43 30*  BILITOT 0.6 0.2*  PROT 6.9 5.1*  ALBUMIN 3.5 2.6*   Coags   Recent Labs Lab 08/08/14 0145  APTT 32  INR 1.10   Sepsis markers  Recent Labs Lab 08/07/14 1934 08/07/14 2348 08/08/14 0145  LATICACIDVEN 1.74 0.93 0.9  PROCALCITON  --   --  <0.10   ABG  Recent Labs Lab 08/07/14 1930  TCO2 23      ASSESSMENT / PLAN:  PULMONARY A: No active issues Tobacco abuse PLAN:   Nicotine patch QD  CARDIOVASCULAR A: Septic shock: with UTI as most likely source. S/p 6L NS, on pressor (neo) H/o Hypertension PLAN:  MAP goal >65 Hold home antihypertensives clonidine 0.2 mg BID and valsartan 160 mg QD   RENAL A: AKI: Likely due to septic shock. Creatinine 2.42 on admission, baseline 0.6-0.7. Improved with IVF PLAN:   NS 150 cc/hr continuous   GASTROINTESTINAL A:   No active issues PLAN:   Start carb mod diet  HEMATOLOGIC A:Normocytic Anemia: Hgb 11.0>8.5. Likely dilutional  PLAN:  Repeat CBC Transfuse per ICU protocol  INFECTIOUS A: UTI with sepsis - UA with +nitrite, many leuc and bacteria PLAN:   Continue Ceftriaxone    ENDOCRINE ASSESSMENT:   DM2- Not on medications at home PLAN:   SSI-S CBG AC/HS Carb mod diet  NEUROLOGIC A:   Recent weakness, AMS at home: likely due to sepsis v benzo use (Xanax 2mg  4 times daily) and opioid use (Percocet 5mg -325mg  4 times daily and Oxycodone 20mg  2-3 times daily). ?Seizure at home: per family, pt had one episode of shaking at home but no clonic tonic.  PLAN:   Seizure precautions Continue low dose BZD to prevent withdrawal Reduce opioid use  CLINICAL SUMMARY: Presented with septic shock most likely due to UTI. IOn antihypertensives at home now held. BP still tenuous despite 6L NS, requiring pressor still. AKI resolving with IVF, will continue hydration and wean off pressor as tolerated. Will narrow down Abx once urine/blood cultures return. Has been on chronic benzodiazapine use, chronic opioid use which could have contributed to weakness, will reduce dose of these medications.   Ky BarbanKennerly, Jeanne Westling D, MD IMTS, PGY3   08/08/2014 7:52 AM

## 2014-08-08 NOTE — Progress Notes (Addendum)
PULMONARY  / CRITICAL CARE MEDICINE  Name: Jeanne Leach MRN: [id number] DOB: 02-26-1966    LOS: 1  CHIEF COMPLAINT:  Weakness, dizziness  BRIEF PATIENT DESCRIPTION: Ms. Jeanne Leach is a 49 yo female with PMHx of HTN, Bipolar Disorder, and DJD who presented with dizziness, weakness, confusion and was found to be hypotensive with a positive UTI.   LINES / TUBES: Peripheral IV  CULTURES: BCx 4/7>> UCx 4/7>>  ANTIBIOTICS: Ceftriaxone 4/7>>  SIGNIFICANT EVENTS:  4/8 Admitted to PCCM for requirement of Neo-synephrine for BP support  MAGING/TESTS: CXR 4/7>> No active cardiopulmonary disease. Urinalysis 4/7>> Brown, cloudy, negative ketones, positive nitrites, moderate leukocytes, large hemoglobin, WBC 21-50, RBC TNTC, many squams, many bacteria.  ECG: EKG 4/7>> sinus tachycardia  HISTORY OF PRESENT ILLNESS:  Mrs. Jeanne Leach is a [age] yo female with PMHx of HTN, Bipolar Disorder, and DJD who presented with dizziness, weakness, and confusion. Patient states 2 days ago she developed chills and weakness. She also reports "not walking right," dizziness upon standing, but also present while laying down, requiring her to hold onto the walls as she walked down the hallway. She fell twice without hitting her head, but scraped both knees. The following day, patient felt slightly better then the day prior, but that evening, the patient's mother and daughter found her at home and stated she was acting confused and not asking inappropriate questions regarding events that had not happened or that had happened a long time ago. They did report slurred speech. Per the family, patient had a witnessed seizure one month ago when she was shaking diffusely at home. She was [facility] 06/23/14 for evaluation and seen by Dr. Leroy Kennedyamilo in the ED. It was felt at that time that there was no need for medication and patient was okay to discharge home with follow up with neurology and driving restrictions.   Patient  admits to chills, weakness, dizziness, and decreased appetite. She denies fever, headache, vision changes, changes in hearing, confusion, chest pain, shortness of breath, cough, nausea, vomiting, abdominal pain, diarrhea or constipation. Patient denies dysuria, urinary urgency, frequency, change in color or odor. She denies increased back pain above her normal.   Patient smokes 1/2-1 ppd for 30 years. She denies alcohol or illicit drug use.   SUBJECTIVE:  Remains afebrile Off pressors   PHYSICAL EXAMINATION: Filed Vitals:   08/08/14 0700 08/08/14 0800 08/08/14 0900 08/08/14 1000  BP: 118/52 132/83 127/53   Pulse: 64 54 54 70  Temp:      TempSrc:      Resp: 10 27 12 13   Height:      Weight:      SpO2: 90% 100% 99% 96%   General: Acutely ill woman Neuro: Alert, follows commands HEENT: mm pink/moist, no jvd Cardiovascular: S1s2, regular rate Lungs: clear to auscultation bilaterally Abdomen: Round, non distended, soft, BS+ Musculoskeletal: No acute deformities  Skin: Intact  LABS: CBC  Recent Labs Lab 08/07/14 1912 08/07/14 1930 08/08/14 0145 08/08/14 0728  WBC 8.9  --  6.9 9.0  HGB 11.0* 12.2 8.5* 9.1*  HCT 33.7* 36.0 26.1* 28.1*  PLT 259  --  211 227   Chemistry  Recent Labs Lab 08/07/14 1912 08/07/14 1930 08/08/14 0145 08/08/14 0728  NA 134* 137 138 139  K 4.7 4.8 4.3 4.2  CL 99 100 106 113*  CO2 21  --  25 21  BUN 23 26* 20 20  CREATININE 2.42* 2.40* 1.82* 1.53*  CALCIUM 8.9  --  7.7* 7.6*  GLUCOSE 145* 143* 118* 127*   Liver Function  Recent Labs Lab 08/07/14 1912 08/08/14 0145  AST 22 15  ALT 12 10  ALKPHOS 43 30*  BILITOT 0.6 0.2*  PROT 6.9 5.1*  ALBUMIN 3.5 2.6*   Coags   Recent Labs Lab 08/08/14 0145  APTT 32  INR 1.10   Sepsis markers  Recent Labs Lab 08/07/14 1934 08/07/14 2348 08/08/14 0145  LATICACIDVEN 1.74 0.93 0.9  PROCALCITON  --   --  <0.10   ABG  Recent Labs Lab 08/07/14 1930  TCO2 23      ASSESSMENT / PLAN:  PULMONARY A: No active issues Tobacco abuse PLAN:   Nicotine patch QD  CARDIOVASCULAR A: Shock: nl lactic acid, nl procalcitonin. While sepsis from UTI is possible, medication induced hypotension could also explain her presentation. S/p 6L NS, on pressor (neo) briefly but now maintaining BP.  H/o Hypertension PLAN:  MAP goal >65 Hold home antihypertensives--will discontinue clonidine 0.2 mg BID which was a new medication of 1-2 weeks for her Hold valsartan 160 mg QD   RENAL A: AKI: Likely due to septic shock. Creatinine 2.42 on admission, baseline 0.6-0.7. Improved with IVF PLAN:   Dcd NS 150 cc/hr  Encourage oral hydration   GASTROINTESTINAL A:   No active issues PLAN:   Start carb mod diet  HEMATOLOGIC A:Normocytic Anemia: Hgb 11.0>8.5. Likely dilutional  PLAN:  Repeat CBC in am   INFECTIOUS A: UTI - UA with +nitrite, many leuc and bacteria. No leucocytosis PLAN:   Continue Ceftriaxone    ENDOCRINE ASSESSMENT:   DM2- Not on medications at home PLAN:   SSI-S CBG AC/HS Carb mod diet  NEUROLOGIC A:   Recent weakness, AMS at home: likely due to hypotension v benzo use (Xanax  4 times daily) and opioid use (Percocet -  4 times daily plus Oxycodone  2-3 times daily). ?Seizure at home: per family, pt had one episode of shaking at home last month but not clonic tonic  PLAN:   Continue low dose BZD to prevent withdrawal Reduce opioid use PT   CLINICAL SUMMARY: Presented with shock most likely due to medication effect of antihypertensive, clonidine 0.2 mg BID had been recently started. Will discontinue clonidine, no chance of rebound hypertension at this point. Also has UTI but normal lactic acid and procalcitonin, continue Rocephin. AKI improved with IVF, will encourage oral hydration. Has been on chronic benzodiazapine use, chronic opioid use which could have contributed to weakness and hypotension, will reduce dose of  these medications. Transfer to Med Surgery.  Ky Barban, MD IMTS, PGY3  08/08/2014 10:20 AM  PCCM ATTENDING: I have reviewed pt's initial presentation, consultants notes and hospital database in detail.  The above assessment and plan was formulated under my direction.  In summary: Pyuria/bacteruria/hemturia c/w cystitis  Hypotension - on further questioning, she has had increased unsteadiness, light-headedness since initiation of clonidine last weak Therefore, it is not clear that the hypotension is truly attributable to septic shock per se and might be more attributable to anti-hypertensive medications Hypotension has now resolved She may be safely transferred to med-surg unit Cont ceftriaxone for now TRH to assume care as of AM 4/09 and PCCM to sign off. Discussed with Dr Chanda Busing, MD;  PCCM service; Mobile [phone]

## 2014-08-09 DIAGNOSIS — N179 Acute kidney failure, unspecified: Secondary | ICD-10-CM

## 2014-08-09 DIAGNOSIS — R6521 Severe sepsis with septic shock: Secondary | ICD-10-CM

## 2014-08-09 DIAGNOSIS — E119 Type 2 diabetes mellitus without complications: Secondary | ICD-10-CM

## 2014-08-09 DIAGNOSIS — F319 Bipolar disorder, unspecified: Secondary | ICD-10-CM

## 2014-08-09 DIAGNOSIS — I10 Essential (primary) hypertension: Secondary | ICD-10-CM

## 2014-08-09 DIAGNOSIS — A419 Sepsis, unspecified organism: Principal | ICD-10-CM

## 2014-08-09 LAB — CBC
HCT: 26.2 % — ABNORMAL LOW (ref 36.0–46.0)
HEMOGLOBIN: 8.5 g/dL — AB (ref 12.0–15.0)
MCH: 31 pg (ref 26.0–34.0)
MCHC: 32.4 g/dL (ref 30.0–36.0)
MCV: 95.6 fL (ref 78.0–100.0)
PLATELETS: 210 10*3/uL (ref 150–400)
RBC: 2.74 MIL/uL — ABNORMAL LOW (ref 3.87–5.11)
RDW: 15 % (ref 11.5–15.5)
WBC: 7 10*3/uL (ref 4.0–10.5)

## 2014-08-09 LAB — GLUCOSE, CAPILLARY
GLUCOSE-CAPILLARY: 108 mg/dL — AB (ref 70–99)
GLUCOSE-CAPILLARY: 118 mg/dL — AB (ref 70–99)

## 2014-08-09 LAB — BASIC METABOLIC PANEL
Anion gap: 6 (ref 5–15)
BUN: 13 mg/dL (ref 6–23)
CO2: 24 mmol/L (ref 19–32)
Calcium: 8.6 mg/dL (ref 8.4–10.5)
Chloride: 108 mmol/L (ref 96–112)
Creatinine, Ser: 1.12 mg/dL — ABNORMAL HIGH (ref 0.50–1.10)
GFR, EST AFRICAN AMERICAN: 67 mL/min — AB (ref >90)
GFR, EST NON AFRICAN AMERICAN: 58 mL/min — AB (ref >90)
Glucose, Bld: 99 mg/dL (ref 70–99)
POTASSIUM: 4.2 mmol/L (ref 3.5–5.1)
Sodium: 138 mmol/L (ref 135–145)

## 2014-08-09 LAB — HEMOGLOBIN A1C
Hgb A1c MFr Bld: 6.3 % — ABNORMAL HIGH (ref 4.8–5.6)
MEAN PLASMA GLUCOSE: 134 mg/dL

## 2014-08-09 MED ORDER — ALPRAZOLAM 2 MG PO TABS: 1.0000 mg | ORAL_TABLET | Freq: Three times a day (TID) | ORAL | Status: DC | PRN

## 2014-08-09 MED ORDER — NICOTINE 14 MG/24HR TD PT24: 14.0000 mg | MEDICATED_PATCH | Freq: Every day | TRANSDERMAL | Status: DC

## 2014-08-09 NOTE — H&P (Signed)
All IV's removed. Pt. Teaching on continuation of prescribed medications. No new prescription to fill. Explained to make sure to follow up with regular physician and request referral for Neurologist per MD.  Gave pt. Opportunity to ask questions. Pt. States she understand all instructions and will follow through with all recommendations. Erick ColaceBevon Holt Student Nurse

## 2014-08-09 NOTE — Discharge Instructions (Signed)
Hypotension  As your heart beats, it forces blood through your arteries. This force is your blood pressure. If your blood pressure is too low for you to go about your normal activities or to support the organs of your body, you have hypotension. Hypotension is also referred to as low blood pressure. When your blood pressure becomes too low, you may not get enough blood to your brain. As a result, you may feel weak, feel lightheaded, or develop a rapid heart rate. In a more severe case, you may faint.  CAUSES  Various conditions can cause hypotension. These include:  · Blood loss.  · Dehydration.  · Heart or endocrine problems.  · Pregnancy.  · Severe infection.  · Not having a well-balanced diet filled with needed nutrients.  · Severe allergic reactions (anaphylaxis).  Some medicines, such as blood pressure medicine or water pills (diuretics), may lower your blood pressure below normal. Sometimes taking too much medicine or taking medicine not as directed can cause hypotension.  TREATMENT   Hospitalization is sometimes required for hypotension if fluid or blood replacement is needed, if time is needed for medicines to wear off, or if further monitoring is needed. Treatment might include changing your diet, changing your medicines (including medicines aimed at raising your blood pressure), and use of support stockings.  HOME CARE INSTRUCTIONS   · Drink enough fluids to keep your urine clear or pale yellow.  · Take your medicines as directed by your health care provider.  · Get up slowly from reclining or sitting positions. This gives your blood pressure a chance to adjust.  · Wear support stockings as directed by your health care provider.  · Maintain a healthy diet by including nutritious food, such as fruits, vegetables, nuts, whole grains, and lean meats.  SEEK MEDICAL CARE IF:  · You have vomiting or diarrhea.  · You have a fever for more than 2-3 days.  · You feel more thirsty than usual.  · You feel weak and  tired.  SEEK IMMEDIATE MEDICAL CARE IF:   · You have chest pain or a fast or irregular heartbeat.  · You have a loss of feeling in some part of your body, or you lose movement in your arms or legs.  · You have trouble speaking.  · You become sweaty or feel lightheaded.  · You faint.  MAKE SURE YOU:   · Understand these instructions.  · Will watch your condition.  · Will get help right away if you are not doing well or get worse.  Document Released: 04/18/2005 Document Revised: 02/06/2013 Document Reviewed: 10/19/2012  [employer]® Patient Information ©2015 [employer]. This information is not intended to replace advice given to you by your health care provider. Make sure you discuss any questions you have with your health care provider.

## 2014-08-09 NOTE — Progress Notes (Addendum)
Pt. Blood pressure stable at 130/90. Pt. c/o left little toe swelling and pain.  Pt. States she is very tired from no sleep the night before.  Reported all findings to RN Student RN Erick ColaceBevon Holt

## 2014-08-09 NOTE — Discharge Summary (Signed)
Physician Discharge Summary  Jeanne Leach:[id number]:[id number] DOB: 01-06-1966 DOA: 08/07/2014  PCP: Altamese CarolinaMARTIN,TANYA D, MD  Admit date: 08/07/2014 Discharge date: 08/09/2014  Time spent: 45 minutes  Recommendations for Outpatient Follow-up:  Patient will be discharged to home. She is to follow-up with her primary care physician on Monday, 08/11/2014. Patient to continue her medications as prescribed. Patient should have repeat CBC and BMP within 1 week of discharge. Patient to continue a carb modified diet.  Discharge Diagnoses:  Principal Problem:   Septic shock Active Problems:   Hypertension   Bipolar 1 disorder   Diabetes mellitus without complication   AKI (acute kidney injury)   Sepsis due to urinary tract infection   Discharge Condition: Stable  Diet recommendation: carb modified diet  Filed Weights   08/07/14 1858 08/08/14 0607  Weight: 90.719 kg (200 lb) 95.6 kg (210 lb 12.2 oz)    History of present illness:  On 08/08/2014 by Dr. Rory Percyaniel Feinstein (ICU) Mrs. Jeanne Leach is a 49 yo female with PMHx of HTN, Bipolar Disorder, and DJD who presented with dizziness, weakness, and confusion. Patient states 2 days ago she developed chills and weakness. She also reports "not walking right," dizziness upon standing, but also present while laying down, requiring her to hold onto the walls as she walked down the hallway. She fell twice without hitting her head, but scraped both knees. The following day, patient felt slightly better then the day prior, but that evening, the patient's mother and daughter found her at home and stated she was acting confused and not asking inappropriate questions regarding events that had not happened or that had happened a long time ago. They did report slurred speech. Per the family, patient had a witnessed seizure one month ago when she was shaking diffusely at home. She was [facility]/23/[facility] for evaluation and seen by Dr. Leroy Kennedyamilo in the ED. It was felt at  that time that there was no need for medication and patient was okay to discharge home with follow up with neurology and driving restrictions.  Patient admits to chills, weakness, dizziness, and decreased appetite. She denies fever, headache, vision changes, changes in hearing, confusion, chest pain, shortness of breath, cough, nausea, vomiting, abdominal pain, diarrhea or constipation. Patient denies dysuria, urinary urgency, frequency, change in color or odor. She denies increased back pain above her normal.  Patient smokes 1/2-1 ppd for 30 years. She denies alcohol or illicit drug use.   Hospital Course:  ?Septic Shock  -Upon admission patient hypotensive -Lactic acid and procalcitonin were normal, patient afebrile with no leukocytosis -Possibly secondary to UTI vs overmedication of BP -Resolved -patient required Neo-Synephrine briefly and was given IV fluid resuscitation -Home medications of clonidine and valsartan were held -Patient should discuss her blood pressure regimen with her primary care physician upon discharge  Urinary tract infection -UA showed positive nitrites, many leukocytes and bacteria, no leukocytosis -Urine culture pending -Patient started on ceftriaxone, will receive a third dose of ceftriaxone today before discharge  Acute kidney injury -Likely secondary to shock versus valsartan use -Creatinine upon admission was 2.42, improving with IV fluids, currently 1.1 -Patient have repeat BMP within 1 week of discharge  Normocytic anemia -Drop in hemoglobin likely dilutional -Patient will need repeat CBC within 1 week of discharge  Diabetes mellitus, type II -Patient currently on no home medications -Placed on insulin sliding scale CBG monitoring during hospitalization -Hemoglobin A1c 6.3  Altered mental status and recent weakness -likely secondary to hypotension versus  medication -Patient on Xanax 2 mg 4 times daily as well as Percocet and oxycodone at  home -Patient urged to follow-up with her primary care physician regarding use of these medications -Will discharge patient on low-dose Xanax  History of bipolar disorder -Patient currently on no home medications  History of seizure -Patient urged to follow-up with neurology and discuss with her primary care physician -Patient told not to drive until seen in by her PCP or neurologist. -Currently on no medications; continue Xanax to prevent withdrawal seizures  Procedures: None  Consultations: PCCM  Discharge Exam: Filed Vitals:   08/09/14 0859  BP: 130/90  Pulse: 71  Temp: 98.4 F (36.9 C)  Resp:      General: Well developed, well nourished, NAD, appears stated age  HEENT: NCAT, mucous membranes moist.  Cardiovascular: S1 S2 auscultated, no rubs, murmurs or gallops. Regular rate and rhythm.  Respiratory: Clear to auscultation bilaterally with equal chest rise  Abdomen: Soft, nontender, nondistended, + bowel sounds  Extremities: warm dry without cyanosis clubbing or edema  Neuro: AAOx3, nonfocal  Psych: Normal affect and demeanor with intact judgement and insight  Discharge Instructions      Discharge Instructions    Discharge instructions    Complete by:  As directed   Patient will be discharged to home. She is to follow-up with her primary care physician on Monday, 08/11/2014. Patient to continue her medications as prescribed. Patient should have repeat CBC and BMP within 1 week of discharge. Patient to continue a carb modified diet.            Medication List    STOP taking these medications        BC HEADACHE POWDER PO     cloNIDine 0.1 MG tablet  Commonly known as:  CATAPRES     meloxicam 7.5 MG tablet  Commonly known as:  MOBIC     valsartan 160 MG tablet  Commonly known as:  DIOVAN      TAKE these medications        alprazolam 2 MG tablet  Commonly known as:  XANAX  Take 0.5 tablets (1 mg total) by mouth 3 (three) times daily as  needed for sleep or anxiety.     MULTI-VITAMIN PO  Take 1 tablet by mouth daily.     nicotine 14 mg/24hr patch  Commonly known as:  NICODERM CQ - dosed in mg/24 hours  Place 1 patch (14 mg total) onto the skin daily.     Oxycodone HCl 20 MG Tabs  Take 20 mg by mouth as needed (for pain).     oxyCODONE-acetaminophen 5-325 MG per tablet  Commonly known as:  PERCOCET/ROXICET  Take 1 tablet by mouth every 4 (four) hours as needed for severe pain.       Allergies  Allergen Reactions  . Codeine Rash   Follow-up Information    Follow up with Altamese Newell, MD. Schedule an appointment as soon as possible for a visit in 1 week.   Specialty:  Family Medicine   Why:  Hospital follow-up, hypotension, history of seizure   Contact information:   [address] [place] [address] [phone]        The results of significant diagnostics from this hospitalization (including imaging, microbiology, ancillary and laboratory) are listed below for reference.    Significant Diagnostic Studies: Dg Chest Portable 1 View  08/07/2014   CLINICAL DATA:  Bipolar disorder.  Hypotension today.  EXAM: PORTABLE CHEST - 1  VIEW  COMPARISON:  March 03, 2006  FINDINGS: The heart size and mediastinal contours are within normal limits. There is no focal infiltrate, pulmonary edema, or pleural effusion. The visualized skeletal structures are unremarkable.  IMPRESSION: No active cardiopulmonary disease.   Electronically Signed   By: Sherian Rein M.D.   On: 08/07/2014 20:59    Microbiology: Recent Results (from the past 240 hour(s))  Blood culture (routine x 2)     Status: None (Preliminary result)   Collection Time: 08/07/14  7:50 PM  Result Value Ref Range Status   Specimen Description BLOOD LEFT ARM  Final   Special Requests BOTTLES DRAWN AEROBIC AND ANAEROBIC 10CC  Final   Culture   Final           BLOOD CULTURE RECEIVED NO GROWTH TO DATE CULTURE WILL BE HELD FOR 5 DAYS BEFORE  ISSUING A FINAL NEGATIVE REPORT Note: Culture results may be compromised due to an excessive volume of blood received in culture bottles. Performed at [facility]    Report Status PENDING  Incomplete  Blood culture (routine x 2)     Status: None (Preliminary result)   Collection Time: 08/07/14  7:54 PM  Result Value Ref Range Status   Specimen Description BLOOD LEFT FOREARM  Final   Special Requests BOTTLES DRAWN AEROBIC AND ANAEROBIC 10CC  Final   Culture   Final           BLOOD CULTURE RECEIVED NO GROWTH TO DATE CULTURE WILL BE HELD FOR 5 DAYS BEFORE ISSUING A FINAL NEGATIVE REPORT Note: Culture results may be compromised due to an excessive volume of blood received in culture bottles. Performed at [facility]    Report Status PENDING  Incomplete  MRSA PCR Screening     Status: None   Collection Time: 08/08/14  6:34 AM  Result Value Ref Range Status   MRSA by PCR NEGATIVE NEGATIVE Final    Comment:        The GeneXpert MRSA Assay (FDA approved for NASAL specimens only), is one component of a comprehensive MRSA colonization surveillance program. It is not intended to diagnose MRSA infection nor to guide or monitor treatment for MRSA infections.      Labs: Basic Metabolic Panel:  Recent Labs Lab 08/07/14 1912 08/07/14 1930 08/08/14 0145 08/08/14 0728 08/09/14 0431  NA 134* 137 138 139 138  K 4.7 4.8 4.3 4.2 4.2  CL 99 100 106 113* 108  CO2 21  --  GLUCOSE 145* 143* 118* 127* 99  BUN 23 26* CREATININE 2.42* 2.40* 1.82* 1.53* 1.12*  CALCIUM 8.9  --  7.7* 7.6* 8.6   Liver Function Tests:  Recent Labs Lab 08/07/14 1912 08/08/14 0145  AST 22 15  ALT 12 10  ALKPHOS 43 30*  BILITOT 0.6 0.2*  PROT 6.9 5.1*  ALBUMIN 3.5 2.6*   No results for input(s): LIPASE, AMYLASE in the last 168 hours. No results for input(s): AMMONIA in the last 168 hours. CBC:  Recent Labs Lab 08/07/14 1912 08/07/14 1930 08/08/14 0145  08/08/14 0728 08/09/14 0431  WBC 8.9  --  6.9 9.0 7.0  NEUTROABS 7.7  --   --   --   --   HGB 11.0* 12.2 8.5* 9.1* 8.5*  HCT 33.7* 36.0 26.1* 28.1* 26.2*  MCV 94.4  --  94.9 95.9 95.6  PLT 259  --  211 227 210   Cardiac Enzymes: No results  for input(s): CKTOTAL, CKMB, CKMBINDEX, TROPONINI in the last 168 hours. BNP: BNP (last 3 results) No results for input(s): BNP in the last 8760 hours.  ProBNP (last 3 results) No results for input(s): PROBNP in the last 8760 hours.  CBG:  Recent Labs Lab 08/08/14 1143 08/08/14 1545 08/08/14 1713 08/08/14 2158 08/09/14 0801  GLUCAP 97 123* 112* 110* 108*       Signed:  Crystol Walpole  [facility] 08/09/2014, 11:18 AM

## 2014-08-11 LAB — URINE CULTURE: Colony Count: >=100000

## 2014-08-14 LAB — CULTURE, BLOOD (ROUTINE X 2)
Culture: NO GROWTH
Culture: NO GROWTH

## 2014-08-17 ENCOUNTER — Emergency Department (HOSPITAL_COMMUNITY): Payer: Medicare Other

## 2014-08-17 ENCOUNTER — Encounter (HOSPITAL_COMMUNITY): Payer: Self-pay | Admitting: *Deleted

## 2014-08-17 ENCOUNTER — Emergency Department (HOSPITAL_COMMUNITY)
Admission: EM | Admit: 2014-08-17 | Discharge: 2014-08-17 | Disposition: A | Discharge status: Home / Self Care | Payer: Medicare Other | Attending: Emergency Medicine | Admitting: Emergency Medicine

## 2014-08-17 DIAGNOSIS — R110 Nausea: Secondary | ICD-10-CM | POA: Insufficient documentation

## 2014-08-17 DIAGNOSIS — R51 Headache: Secondary | ICD-10-CM | POA: Insufficient documentation

## 2014-08-17 DIAGNOSIS — R42 Dizziness and giddiness: Secondary | ICD-10-CM | POA: Diagnosis not present

## 2014-08-17 DIAGNOSIS — Z8632 Personal history of gestational diabetes: Secondary | ICD-10-CM | POA: Diagnosis not present

## 2014-08-17 DIAGNOSIS — I10 Essential (primary) hypertension: Secondary | ICD-10-CM | POA: Diagnosis not present

## 2014-08-17 DIAGNOSIS — R5383 Other fatigue: Secondary | ICD-10-CM | POA: Diagnosis present

## 2014-08-17 DIAGNOSIS — Z79899 Other long term (current) drug therapy: Secondary | ICD-10-CM | POA: Insufficient documentation

## 2014-08-17 DIAGNOSIS — Z720 Tobacco use: Secondary | ICD-10-CM | POA: Diagnosis not present

## 2014-08-17 DIAGNOSIS — I159 Secondary hypertension, unspecified: Secondary | ICD-10-CM | POA: Diagnosis not present

## 2014-08-17 LAB — COMPREHENSIVE METABOLIC PANEL
ALBUMIN: 3.5 g/dL (ref 3.5–5.2)
ALK PHOS: 48 U/L (ref 39–117)
ALT: 10 U/L (ref 0–35)
AST: 17 U/L (ref 0–37)
Anion gap: 9 (ref 5–15)
BUN: 6 mg/dL (ref 6–23)
CALCIUM: 9.6 mg/dL (ref 8.4–10.5)
CO2: 26 mmol/L (ref 19–32)
Chloride: 102 mmol/L (ref 96–112)
Creatinine, Ser: 0.82 mg/dL (ref 0.50–1.10)
GFR calc non Af Amer: 84 mL/min — ABNORMAL LOW (ref >90)
Glucose, Bld: 119 mg/dL — ABNORMAL HIGH (ref 70–99)
Potassium: 3.9 mmol/L (ref 3.5–5.1)
SODIUM: 137 mmol/L (ref 135–145)
Total Bilirubin: 0.5 mg/dL (ref 0.3–1.2)
Total Protein: 7.2 g/dL (ref 6.0–8.3)

## 2014-08-17 LAB — CBC WITH DIFFERENTIAL/PLATELET
Basophils Absolute: 0 10*3/uL (ref 0.0–0.1)
Basophils Relative: 1 % (ref 0–1)
Eosinophils Absolute: 0.2 10*3/uL (ref 0.0–0.7)
Eosinophils Relative: 2 % (ref 0–5)
HCT: 38.5 % (ref 36.0–46.0)
HEMOGLOBIN: 12.8 g/dL (ref 12.0–15.0)
LYMPHS PCT: 18 % (ref 12–46)
Lymphs Abs: 1.3 10*3/uL (ref 0.7–4.0)
MCH: 30.2 pg (ref 26.0–34.0)
MCHC: 33.2 g/dL (ref 30.0–36.0)
MCV: 90.8 fL (ref 78.0–100.0)
MONOS PCT: 5 % (ref 3–12)
Monocytes Absolute: 0.4 10*3/uL (ref 0.1–1.0)
NEUTROS ABS: 5.5 10*3/uL (ref 1.7–7.7)
Neutrophils Relative %: 74 % (ref 43–77)
Platelets: 423 10*3/uL — ABNORMAL HIGH (ref 150–400)
RBC: 4.24 MIL/uL (ref 3.87–5.11)
RDW: 14.4 % (ref 11.5–15.5)
WBC: 7.3 10*3/uL (ref 4.0–10.5)

## 2014-08-17 LAB — URINALYSIS, ROUTINE W REFLEX MICROSCOPIC
BILIRUBIN URINE: NEGATIVE
Glucose, UA: NEGATIVE mg/dL
Ketones, ur: NEGATIVE mg/dL
Leukocytes, UA: NEGATIVE
Nitrite: NEGATIVE
PH: 6.5 (ref 5.0–8.0)
Protein, ur: NEGATIVE mg/dL
SPECIFIC GRAVITY, URINE: 1.005 (ref 1.005–1.030)
Urobilinogen, UA: 0.2 mg/dL (ref 0.0–1.0)

## 2014-08-17 LAB — URINE MICROSCOPIC-ADD ON

## 2014-08-17 LAB — I-STAT TROPONIN, ED: TROPONIN I, POC: 0 ng/mL (ref 0.00–0.08)

## 2014-08-17 MED ORDER — OXYCODONE-ACETAMINOPHEN 5-325 MG PO TABS
2.0000 | ORAL_TABLET | Freq: Once | ORAL | Status: AC
  Administered 2014-08-17: 2 via ORAL
  Filled 2014-08-17: qty 2

## 2014-08-17 MED ORDER — MECLIZINE HCL 25 MG PO TABS
25.0000 mg | ORAL_TABLET | Freq: Once | ORAL | Status: AC
  Administered 2014-08-17: 25 mg via ORAL
  Filled 2014-08-17: qty 1

## 2014-08-17 MED ORDER — MECLIZINE HCL 50 MG PO TABS: 50.0000 mg | ORAL_TABLET | Freq: Three times a day (TID) | ORAL | Status: DC | PRN

## 2014-08-17 MED ORDER — SODIUM CHLORIDE 0.9 % IV BOLUS (SEPSIS)
1000.0000 mL | Freq: Once | INTRAVENOUS | Status: AC
  Administered 2014-08-17: 1000 mL via INTRAVENOUS

## 2014-08-17 MED ORDER — VALSARTAN 160 MG PO TABS: 160.0000 mg | ORAL_TABLET | Freq: Every day | ORAL | Status: DC

## 2014-08-17 NOTE — ED Provider Notes (Signed)
CSN: [id number]     Arrival date & time 08/17/14  1126 History   First MD Initiated Contact with Patient 08/17/14 1240     Chief Complaint  Patient presents with  . Fatigue  . Nausea  . Hypertension     (Consider location/radiation/quality/duration/timing/severity/associated sxs/prior Treatment) HPI Comments: Pt is a 49 y.o. female recently admitted for hypotension thought to be due to polypharmacy vs urosepsis, who presents with multiple complaints including malaise, nausea, h/a, dizziness (described as room spinning w/ mvmt), difficulty ambulating, and h/a (constant frontal h/a for 2-3 days), and intermittent confusion (described by husband as nonsensical speech before falling asleep). Symptoms present since before recent admission (approx 7-10 days ago, are constant). She also reports BP now elevated since d/c, at which time she was taken off her BP meds due to hypotension.   Past Medical History  Diagnosis Date  . Hypertension   . Diabetes mellitus     gestational only  . Degenerative disc disease, cervical   . Degenerative disc disease, lumbar   . Bipolar 1 disorder    Past Surgical History  Procedure Laterality Date  . Cesarean section    . Lipoma excision     History reviewed. No pertinent family history. History  Substance Use Topics  . Smoking status: Current Every Day Smoker -- 0.50 packs/day  . Smokeless tobacco: Never Used  . Alcohol Use: No   OB History    No data available     Review of Systems  Constitutional: Positive for fatigue. Negative for fever, chills, diaphoresis, activity change and appetite change.  HENT: Negative for congestion, facial swelling, rhinorrhea and sore throat.   Eyes: Negative for photophobia and discharge.  Respiratory: Negative for cough, chest tightness and shortness of breath.   Cardiovascular: Negative for chest pain, palpitations and leg swelling.  Gastrointestinal: Positive for nausea. Negative for vomiting, abdominal pain  and diarrhea.  Endocrine: Negative for polydipsia and polyuria.  Genitourinary: Negative for dysuria, frequency, difficulty urinating and pelvic pain.  Musculoskeletal: Negative for back pain, arthralgias, neck pain and neck stiffness.  Skin: Negative for color change and wound.  Allergic/Immunologic: Negative for immunocompromised state.  Neurological: Positive for dizziness and headaches. Negative for facial asymmetry, weakness and numbness.  Hematological: Does not bruise/bleed easily.  Psychiatric/Behavioral: Negative for confusion and agitation.      Allergies  Codeine  Home Medications   Prior to Admission medications   Medication Sig Start Date End Date Taking? Authorizing Provider  alprazolam Prudy Feeler) 2 MG tablet Take 0.5 tablets (1 mg total) by mouth 3 (three) times daily as needed for sleep or anxiety. 08/09/14  Yes Maryann Mikhail, DO  Multiple Vitamin (MULTI-VITAMIN PO) Take 1 tablet by mouth daily.   Yes Historical Provider, MD  Oxycodone HCl 20 MG TABS Take 20 mg by mouth as needed (for pain).   Yes Historical Provider, MD  oxyCODONE-acetaminophen (PERCOCET/ROXICET) 5-325 MG per tablet Take 1 tablet by mouth every 4 (four) hours as needed for severe pain.   Yes Historical Provider, MD  meclizine (ANTIVERT) 50 MG tablet Take 1 tablet (50 mg total) by mouth 3 (three) times daily as needed for dizziness or nausea. 08/17/14   Toy Cookey, MD  nicotine (NICODERM CQ - DOSED IN MG/24 HOURS) 14 mg/24hr patch Place 1 patch (14 mg total) onto the skin daily. Patient not taking: Reported on 08/17/2014 08/09/14   Maryann Mikhail, DO  valsartan (DIOVAN) 160 MG tablet Take 1 tablet (160 mg total) by mouth daily. 08/17/14  Toy Cookey, MD   BP 172/100 mmHg  Pulse 66  Temp(Src) 98.4 F (36.9 C) (Oral)  Resp 19  Wt 197 lb 6 oz (89.529 kg)  SpO2 100%  LMP 08/07/2014 Physical Exam  Constitutional: She is oriented to person, place, and time. She appears well-developed and  well-nourished. No distress.  HENT:  Head: Normocephalic and atraumatic.  Mouth/Throat: No oropharyngeal exudate.  Eyes: Pupils are equal, round, and reactive to light.  Neck: Normal range of motion. Neck supple.  Cardiovascular: Normal rate, regular rhythm and normal heart sounds.  Exam reveals no gallop and no friction rub.   No murmur heard. Pulmonary/Chest: Effort normal and breath sounds normal. No respiratory distress. She has no wheezes. She has no rales.  Abdominal: Soft. Bowel sounds are normal. She exhibits no distension and no mass. There is no tenderness. There is no rebound and no guarding.  Musculoskeletal: Normal range of motion. She exhibits no edema or tenderness.  Neurological: She is alert and oriented to person, place, and time. She displays no atrophy and no tremor. No cranial nerve deficit or sensory deficit. She exhibits normal muscle tone. Coordination and gait normal. GCS eye subscore is 4. GCS verbal subscore is 5. GCS motor subscore is 6.  +dix halpike on right by reproduction of symptoms.   Skin: Skin is warm and dry.  Psychiatric: She has a normal mood and affect.    ED Course  Procedures (including critical care time) Labs Review Labs Reviewed  CBC WITH DIFFERENTIAL/PLATELET - Abnormal; Notable for the following:    Platelets 423 (*)    All other components within normal limits  COMPREHENSIVE METABOLIC PANEL - Abnormal; Notable for the following:    Glucose, Bld 119 (*)    GFR calc non Af Amer 84 (*)    All other components within normal limits  URINALYSIS, ROUTINE W REFLEX MICROSCOPIC - Abnormal; Notable for the following:    Hgb urine dipstick MODERATE (*)    All other components within normal limits  URINE CULTURE  URINE MICROSCOPIC-ADD ON  Rosezena Sensor, ED    Imaging Review Ct Head Wo Contrast  08/17/2014   CLINICAL DATA:  Patient with dizziness for 1- 2 weeks. History of hypertension.  EXAM: CT HEAD WITHOUT CONTRAST  TECHNIQUE: Contiguous  axial images were obtained from the base of the skull through the vertex without intravenous contrast.  COMPARISON:  MRI brain 06/24/2014; CT brain 06/24/2014  FINDINGS: Ventricles and sulci are appropriate for patient's age. No evidence for acute cortically based infarct, intracranial hemorrhage, mass lesion or mass-effect. Orbits are unremarkable. Paranasal sinuses are unremarkable. Mastoid air cells are well aerated. Calvarium is intact.  IMPRESSION: No acute intracranial process.   Electronically Signed   By: Annia Belt M.D.   On: 08/17/2014 14:47     EKG Interpretation   Date/Time:  Sunday August 17 2014 13:04:42 EDT Ventricular Rate:  76 PR Interval:  181 QRS Duration: 80 QT Interval:  401 QTC Calculation: 451 R Axis:   20 Text Interpretation:  Sinus rhythm Prior EKG with sinus tach Confirmed by  DOCHERTY  MD, MEGAN [phone]) on 08/17/2014 1:07:50 PM      MDM   Final diagnoses:  Secondary hypertension, unspecified  Vertigo    Pt is a 49 y.o. female with Pmhx as above who presents with multiple complaints including malaise, nausea, h/a, dizziness (described as room spinning w/ mvmt), difficulty ambulating, and h/a, and intermittent confusion (described by husband as nonsensical speech before falling asleep).  She also reports BP now elevated since d/c, at which time she was taken off her BP meds due to hypotension. On PE, Pt hypertensive, VSS, pt in NAD. Cardiopulm exam benign, neuro exam benign, Dix hal pike + for increased symptoms on the right, Elpy maneuver done. Pt able to ambulate w/o difficulty. W/u with no signs of end organ damage (nml trop, Cr, CT head), CBC, CMP unremarkable. UA with moderate Hb.  Pt given IVF, meclizine, and home PO percocet with mild improvement of symptoms.Will restart prior home dose of Diovan, request she start a home BP log, f/u closely with PCP, have urine rechecked for continued hematuria. Meclizine also given. Doubt CVA/TIA given dizziness worse with  mvmt, exacerbated by Dix-halpike. I feel the confusion husband mentions is likely sleep talking, pt has not been confused in dept, has answered all questions appropriately.     Sammuel Hineseborah Chittenden evaluation in the Emergency Department is complete. It has been determined that no acute conditions requiring further emergency intervention are present at this time. The patient/guardian have been advised of the diagnosis and plan. We have discussed signs and symptoms that warrant return to the ED, such as changes or worsening in symptoms, worsening pain, numbness, weakness, fever.       Toy CookeyMegan Docherty, MD 08/18/14 [phone]

## 2014-08-17 NOTE — Discharge Instructions (Signed)
Hypertension °Hypertension, commonly called high blood pressure, is when the force of blood pumping through your arteries is too strong. Your arteries are the blood vessels that carry blood from your heart throughout your body. A blood pressure reading consists of a higher number over a lower number, such as 110/72. The higher number (systolic) is the pressure inside your arteries when your heart pumps. The lower number (diastolic) is the pressure inside your arteries when your heart relaxes. Ideally you want your blood pressure below 120/80. °Hypertension forces your heart to work harder to pump blood. Your arteries may become narrow or stiff. Having hypertension puts you at risk for heart disease, stroke, and other problems.  °RISK FACTORS °Some risk factors for high blood pressure are controllable. Others are not.  °Risk factors you cannot control include:  °· Race. You may be at higher risk if you are African American. °· Age. Risk increases with age. °· Gender. Men are at higher risk than women before age 45 years. After age 65, women are at higher risk than men. °Risk factors you can control include: °· Not getting enough exercise or physical activity. °· Being overweight. °· Getting too much fat, sugar, calories, or salt in your diet. °· Drinking too much alcohol. °SIGNS AND SYMPTOMS °Hypertension does not usually cause signs or symptoms. Extremely high blood pressure (hypertensive crisis) may cause headache, anxiety, shortness of breath, and nosebleed. °DIAGNOSIS  °To check if you have hypertension, your health care provider will measure your blood pressure while you are seated, with your arm held at the level of your heart. It should be measured at least twice using the same arm. Certain conditions can cause a difference in blood pressure between your right and left arms. A blood pressure reading that is higher than normal on one occasion does not mean that you need treatment. If one blood pressure reading  is high, ask your health care provider about having it checked again. °TREATMENT  °Treating high blood pressure includes making lifestyle changes and possibly taking medicine. Living a healthy lifestyle can help lower high blood pressure. You may need to change some of your habits. °Lifestyle changes may include: °· Following the DASH diet. This diet is high in fruits, vegetables, and whole grains. It is low in salt, red meat, and added sugars. °· Getting at least 2½ hours of brisk physical activity every week. °· Losing weight if necessary. °· Not smoking. °· Limiting alcoholic beverages. °· Learning ways to reduce stress. ° If lifestyle changes are not enough to get your blood pressure under control, your health care provider may prescribe medicine. You may need to take more than one. Work closely with your health care provider to understand the risks and benefits. °HOME CARE INSTRUCTIONS °· Have your blood pressure rechecked as directed by your health care provider.   °· Take medicines only as directed by your health care provider. Follow the directions carefully. Blood pressure medicines must be taken as prescribed. The medicine does not work as well when you skip doses. Skipping doses also puts you at risk for problems.   °· Do not smoke.   °· Monitor your blood pressure at home as directed by your health care provider.  °SEEK MEDICAL CARE IF:  °· You think you are having a reaction to medicines taken. °· You have recurrent headaches or feel dizzy. °· You have swelling in your ankles. °· You have trouble with your vision. °SEEK IMMEDIATE MEDICAL CARE IF: °· You develop a severe headache or confusion. °·   You have unusual weakness, numbness, or feel faint.  You have severe chest or abdominal pain.  You vomit repeatedly.  You have trouble breathing. MAKE SURE YOU:   Understand these instructions.  Will watch your condition.  Will get help right away if you are not doing well or get worse. Document  Released: 04/18/2005 Document Revised: 09/02/2013 Document Reviewed: 02/08/2013 [facility] Patient Information 2015 [place], [place]. This information is not intended to replace advice given to you by your health care provider. Make sure you discuss any questions you have with your health care provider.   Benign Positional Vertigo Vertigo means you feel like you or your surroundings are moving when they are not. Benign positional vertigo is the most common form of vertigo. Benign means that the cause of your condition is not serious. Benign positional vertigo is more common in older adults. CAUSES  Benign positional vertigo is the result of an upset in the labyrinth system. This is an area in the middle ear that helps control your balance. This may be caused by a viral infection, head injury, or repetitive motion. However, often no specific cause is found. SYMPTOMS  Symptoms of benign positional vertigo occur when you move your head or eyes in different directions. Some of the symptoms may include:  Loss of balance and falls.  Vomiting.  Blurred vision.  Dizziness.  Nausea.  Involuntary eye movements (nystagmus). DIAGNOSIS  Benign positional vertigo is usually diagnosed by physical exam. If the specific cause of your benign positional vertigo is unknown, your caregiver may perform imaging tests, such as magnetic resonance imaging (MRI) or computed tomography (CT). TREATMENT  Your caregiver may recommend movements or procedures to correct the benign positional vertigo. Medicines such as meclizine, benzodiazepines, and medicines for nausea may be used to treat your symptoms. In rare cases, if your symptoms are caused by certain conditions that affect the inner ear, you may need surgery. HOME CARE INSTRUCTIONS   Follow your caregiver's instructions.  Move slowly. Do not make sudden body or head movements.  Avoid driving.  Avoid operating heavy machinery.  Avoid performing any tasks that  would be dangerous to you or others during a vertigo episode.  Drink enough fluids to keep your urine clear or pale yellow. SEEK IMMEDIATE MEDICAL CARE IF:   You develop problems with walking, weakness, numbness, or using your arms, hands, or legs.  You have difficulty speaking.  You develop severe headaches.  Your nausea or vomiting continues or gets worse.  You develop visual changes.  Your family or friends notice any behavioral changes.  Your condition gets worse.  You have a fever.  You develop a stiff neck or sensitivity to light. MAKE SURE YOU:   Understand these instructions.  Will watch your condition.  Will get help right away if you are not doing well or get worse. Document Released: 01/24/2006 Document Revised: 07/11/2011 Document Reviewed: 01/06/2011 [facility] Patient Information 2015 [place], [place]. This information is not intended to replace advice given to you by your health care provider. Make sure you discuss any questions you have with your health care provider.

## 2014-08-17 NOTE — ED Notes (Signed)
Patient transported to CT 

## 2014-08-17 NOTE — ED Notes (Signed)
Patient returned from CT

## 2014-08-17 NOTE — ED Notes (Signed)
Pt reports UTI and sepsis. Was admitted and when discharged was told not to take bp meds. Pt reports feeling very tired, "foggy headed" and not eating. Pt hypertensive at triage.

## 2014-08-18 LAB — URINE CULTURE: Colony Count: >=100000

## 2014-08-23 NOTE — Discharge Summary (Signed)
PATIENT NAME:  Jeanne Leach, Vista MR#:  [id number] DATE OF BIRTH:  Jun 28, 1965  DATE OF ADMISSION:  05/21/2013 DATE OF DISCHARGE:    DATE OF PLANNED DISCHARGE:  05/24/2013.   HOSPITAL COURSE:  See dictated history and physical for details of admission. This 49 year old woman with a history of opiate dependence came to the hospital voluntarily seeking treatment for opiate withdrawal. She was admitted to the hospital and treated with a 3-day Suboxone taper, as well as other supportive medication for opiate withdrawal symptoms. Additionally, she was given standing doses of Librium because of her involvement with benzodiazepine use as well. The patient had some symptoms of both opiate and benzodiazepine withdrawal, but no sign of delirium and no seizures. After her first day in the hospital, her mood improved, and she was appropriately interactive in groups and individual therapy. The patient was counseled and advised to consider going to the alcohol and drug abuse treatment center for further rehab given that she has no history with substance abuse treatment and has had a difficult time maintaining sobriety outside the hospital. She was agreeable to this and has been accepted with a bed available tomorrow. At the time of discharge, the patient's affect is upbeat. She is lucid in her thinking and optimistic. She denies any suicidal ideation. Physically, appears to be stable. She may require some further treatment for opiate or benzodiazepine withdrawal after going to the alcohol and drug abuse treatment center since it has just been 2 days since her admission.   DISCHARGE MENTAL STATUS EXAMINATION:  Neatly dressed and groomed woman, looks her stated age. Cooperative with the interview. Good eye contact. Normal psychomotor activity. Speech normal rate, tone, and volume. Affect euthymic, reactive, appropriate. Mood stated as better. Thoughts are lucid. No loosening of associations or delusions. Denies auditory or visual  hallucinations. Denies suicidal or homicidal ideation. Shows improved insight and judgment. Normal intelligence.   DISCHARGE MEDICATIONS:  Valsartan 160 mg in the morning for high blood pressure, hydrochlorothiazide 25 mg in the morning for high blood pressure, trazodone 100 mg at night as needed for sleep, Imodium 2 mg 4 times a day as needed for diarrhea, ondansetron 4 mg once every 6 hours as needed for nausea, methocarbamol 750 mg 2 tablets every 6 hours as needed for pain. Limited supplies were given of detox medicines.   LABORATORY RESULTS:  Admission labs included a negative pregnancy test. Salicylates slightly elevated at 4.8, but not toxic. Alcohol level negative. Chemistry panel, elevated glucose on admission 163, sodium low at 131, chloride low at 96. CBC showed an elevated white count at 16.8, platelet count elevated at 507. Urinalysis borderline, probably contaminated. Drug screen showed positive for benzodiazepines, opiates.   DISPOSITION:  The patient will be discharged voluntarily tomorrow morning with a [place].   DIAGNOSIS, PRINCIPAL AND PRIMARY:  AXIS I:  Opiate dependence.   SECONDARY DIAGNOSES:  AXIS I:   1.  Benzodiazepine dependence.  2.  Substance-induced anxiety.  AXIS II:  Deferred.  AXIS III:  Opiate withdrawal, high blood pressure.  AXIS IV:  Severe from financial strain on her family.  AXIS V:  Functioning at time of discharge is 56.   ____________________________ Jeanne AmelJohn T. Clapacs, MD jtc:ms D: 05/23/2013 20:58:02 ET T: 05/23/2013 21:40:26 ET JOB#: [id number]  cc: Jeanne AmelJohn T. Clapacs, MD, <Dictator> Jeanne AmelJOHN T CLAPACS MD ELECTRONICALLY SIGNED 05/24/2013 9:56

## 2014-08-23 NOTE — H&P (Signed)
PATIENT NAME:  Jeanne HinesMOORE, Jeanne Leach DATE OF BIRTH:  04-10-1966  DATE OF ADMISSION:  05/21/2013  IDENTIFYING INFORMATION AND CHIEF COMPLAINT:  A 49 year old woman presented to the Emergency Room.   CHIEF COMPLAINT:  "I want to get off these pills."   HISTORY OF PRESENT ILLNESS:  Information obtained from the patient and the chart.  The patient came to the Emergency Room saying that she was addicted to narcotic pain medicines and wants to stop them.  She had an argument with her family and has also been feeling increasingly guilty about the amount of money that she is spending on opiates.  She claims that she is spending as much as $300 per day on narcotic pain medicines.  At times she says she has them prescribed, but she is also buying them off the street.  Says that she uses as much as she can get her hands on including Percocet, OxyContin, oxycodone and also complains that recently she started snorting heroin.  She denies that she is abusing alcohol and denies that she is abusing any other drugs.  Her motivation is feeling like it is damaging her relationship with her family.  The patient is feeling anxious and down, but not consistently depressed.  Does not have any suicidal ideation.  Does not have any psychotic symptoms.  Does not have any homicidal ideation.   PAST PSYCHIATRIC HISTORY:  She said she has been addicted pain medicines for a few years.  She has tried stopping on her own and has been able to go through withdrawal briefly on her own, but then has relapsed.  She denies any history of other psychiatric hospitalization.  Denies any history of suicidal behavior.  Not currently on any psychiatric medicine.  Not currently seeing a psychiatrist.   FAMILY HISTORY:  Says that she does have some substance abuse in her family.   SOCIAL HISTORY:  The patient is married and lives with her husband and one son who is an adolescent.  The patient is not working.  Husband works part time, but  is on disability.  The patient gets a lot of the money she spends on drugs from her mother.   PAST MEDICAL HISTORY:  High blood pressure, chronic anxiety disorder, allegedly.   Chronic back pain for which she has been prescribed the narcotics.   CURRENT MEDICATIONS:  Says that she gets Percocet 10 mg strength, takes two of them every 4 to 6 hours plus other medicine she buys off the street plus heroin, alprazolam 2 mg 4 times a day, Diovan hydrochlorothiazide combination 25/160, 1 tablet per day.   ALLERGIES:  No known drug allergies.   REVIEW OF SYSTEMS:  Already starting to feel some achiness, has some symptoms of runny nose.  Feeling a little chilled.  Mood is anxious and feels somewhat guilty, but does not feel severely depressed.  Denies suicidal or homicidal ideation.   MENTAL STATUS EXAMINATION:  Neatly dressed and groomed woman, looks her stated age, interviewed in the Emergency Room.  Appropriate interaction.  Eye contact good.  Psychomotor activity normal.  Speech normal in rate, tone and volume.  Affect mildly anxious.  Mood stated as being stressed.  Thoughts are lucid without loosening of associations or delusions.  Denies auditory or visual hallucinations.  Denies suicidal or homicidal ideation.  Shows adequate judgment and insight.  Normal intelligence.  Alert and oriented x 4.   PHYSICAL EXAMINATION: GENERAL:  Overweight woman appears in no acute distress.  SKIN:  No skin lesions identified.  HEENT:  Pupils are both expanded, but reactive and symmetric.  Face symmetric.  Oral mucosa dry.  NEUROLOGIC:  Full range of motion at all extremities.  Normal gait.  Strength and reflexes symmetric and normal throughout.  Cranial nerves symmetric and normal.  LUNGS:  Clear without wheezes.  HEART:  Regular rate and rhythm.  No extra sounds.  ABDOMEN:  Soft, nontender, normal bowel sounds.  VITAL SIGNS:  Currently, pulse 76, respirations 18, temperature 97, blood pressure 157/101.    LABORATORY RESULTS:  Chemistry panel show alcohol level 0, total protein slightly elevated at 9.3, sodium is low 131, glucose elevated at 163.  Drug screen positive for opiates and benzodiazepines.  CBC:  Elevated white count at 16.8, elevated platelet count 507.  Urinalysis likely contaminated.  Not obviously infected.  Pregnancy test negative.   ASSESSMENT:  A 49 year old woman presents with opiate dependence, requesting detox.  She was accepted and admitted by the Emergency Room psychiatrist.  The patient has had difficulty getting and maintaining sobriety outside the hospital.   TREATMENT PLAN:  Brief use of Suboxone for detox.  Other medications for opiate detox including clonidine, Robaxin, Zofran and Imodium as needed.  Brief taper with Librium to try and decrease the Xanax.  Include the patient in groups and activities, work on improving substance abuse insight.  Consider possible referral for rehab treatment.   DIAGNOSIS, PRINCIPAL AND PRIMARY:  AXIS I:  Opiate dependence.   SECONDARY DIAGNOSES: AXIS I:  Benzodiazepine dependence.  AXIS II:  No diagnosis.  AXIS III:  High blood pressure, chronic back pain.  AXIS IV:  Severe stress from financial difficulties.  AXIS V:  Functioning at time of evaluation 46.     ____________________________ Audery Amel, MD jtc:ea D: 05/21/2013 23:29:26 ET T: 05/21/2013 23:58:18 ET JOB#: [id number]  cc: Audery Amel, MD, <Dictator> Audery Amel MD ELECTRONICALLY SIGNED 05/22/2013 23:15

## 2014-08-23 NOTE — Consult Note (Signed)
PATIENT NAME:  Jeanne Leach, Jeanne Leach MR#:  [id number] DATE OF BIRTH:  1965-10-14  DATE OF CONSULTATION:  05/21/2013  CONSULTING PHYSICIAN:  Ka Flammer S. Garnetta Buddy, MD  REASON FOR CONSULTATION: "I am addicted to pain pills. I want to get help."  REQUESTED BY:  Rockne Menghini  HISTORY OF PRESENT ILLNESS: The patient is a 48 year old Caucasian female who presented to the ER and was brought in by the family members after she requested help. She reported that she has been using oxycodone, OxyContin, morphine and heroin for the past 9 years.  She reported that she has been prescribed by Percocet by her primary care physician, but she has been mixing it with other pain medications. She reported that there was a big family conflict yesterday when she borrowed $300 from her mother and her son who is 40 year old was also present. She reported that she has to borrow money from her mother to meet her needs. The patient reported that her husband is currently disabled. The patient has stolen and pawned items to purchase the pills. Reported that she has been getting 240 pills of Percocet, as well as 2 mg 4 times a day from her primary care physician, Dr. Daphine Deutscher in [place]. The patient has recently received the prescription on 04/08/2013. The patient reported that when she is about to run out of her medications, then she started getting oxycodone and then replaces it with the OxyContin. She also snorts heroin as well. The patient reported that she decided after she had a big blow up with her family last night that she needed help, as she is unable to control her addiction at this time. Reported that she does not use other drugs, including alcohol, cocaine or marijuana. Her drug of choice is pain medications. She stated that she feels depressed, anxious, hopeless and helpless at times. She stated that she has poor sleep and poor appetite. However, she feels motivated and wants help. She came in on a voluntary basis and is  interested in stopping the use of the opioids at this time. She denied having any suicidal ideations or plans.   PAST PSYCHIATRIC HISTORY: The patient reported that she has seen a psychiatrist and a therapist a very long time ago and at that time she was diagnosed with bipolar disorder. She does not remember what medications she was given at that time. Reported that she has no other history of suicide attempts in the past.   PAST MEDICAL HISTORY: Chronic back pain and she is getting Percocet prescribed by Dr. Daphine Deutscher on a consistent basis. She has also been diagnosed with anxiety disorder and is getting Xanax 2 mg 4 times a day for the same. She stated that she does not have any issues stopping the Xanax at this time.   ALLERGIES: No known drug allergies.   CURRENT MEDICATIONS: 1.  Diovan 25 mg to 160 mg 1 tablet once a day. 2.  Alprazolam 2 mg 4 times a day. 3.  Percocet 325/10 mg 2 tablets every 4 to 6 hours #120 prescribed every month.   FAMILY HISTORY: The patient reported that there is no family history of psychiatric illness. Reported that her family members do not use any drugs or alcohol at this time.   SOCIAL HISTORY: The patient currently lives with her husband and her son. Her husband is currently disabled. Her son is 82 years old. Reported that she has to borrow money from her mother and this led to a big conflict among her family  members. She stated that her family members are currently very supportive and they all want her to stop using the opioid addiction at this time. She denied any pending legal charges at this time. She reported that she has never been to a drug rehab program or to a psychiatric hospital in the past.  REVIEW OF SYSTEMS:  CONSTITUTIONAL: Denies any fever or chills. No weight changes.  EYES: No double or blurred vision.  RESPIRATORY: No shortness of breath or cough.  CARDIOVASCULAR: Denies any chest pain or orthopnea. GASTROINTESTINAL: No abdominal pain, nausea,  vomiting or diarrhea.  GENITOURINARY: No incontinence or frequency.  ENDOCRINE: No heat or cold intolerance.  LYMPHATIC: No anemia or easy bruising.  INTEGUMENTARY: No acne or rash.  MUSCULOSKELETAL: Having some muscle and joint pain.  NEUROLOGIC: No tingling or weakness.   VITAL SIGNS: Temperature 98.4, pulse 78, respirations 20, blood pressure 147/81.  LABORATORY DATA:  Glucose 163, BUN 11, creatinine 0.94. Sodium 131, potassium 3.6, chloride 96, bicarbonate 31, anion gap 4, osmolality 256. Blood alcohol less than 3. Protein 9.3, albumin 4.0, bilirubin 0.3, alkaline phosphatase 62, AST 20, ALT 18. UDS positive for benzodiazepines and opioids. WBC 15.8, RBC 4.46, hemoglobin 14.1, hematocrit 43, platelet count 507, MCV 96, RDW 13.6.   MENTAL STATUS EXAMINATION: The patient is a moderately-built female who appears her stated age. She appeared calm and cooperative. She was lying in the ED bed. Her speech was low in tone and volume. Mood was depressed and anxious. Affect was congruent. Thought process was logical, goal-directed. Thought content was nondelusional. She currently denied having any suicidal or homicidal ideations or plans. She appeared motivated to get the re-dose on her medications. She demonstrated fair insight and judgment.   DIAGNOSTIC IMPRESSION: AXIS I:  Opioid dependence.               Opioid-induced mood disorder, major depressive disorder, recurrent, morbid.  AXIS II: None.  AXIS III: Hypertension, chronic back pain.   TREATMENT PLAN: 1.  Discussed with  patient about the  medications. Treatment risks, benefits and alternatives.  2.  She is willing to be admitted on a voluntary basis to the inpatient unit to get help about her opioid addiction. 3.  We can start her on the opioid withdrawal protocol, including clonidine, Flexeril, trazodone and Librium, and she demonstrated understanding. The patient will be monitored on a close basis by the treatment team, as well as the  staff and nurses on the unit.  Thank you for allowing me to participate in the care of this patient at this time.    ____________________________ Ardeen FillersUzma S. Garnetta BuddyFaheem, MD usf:ce D: 05/21/2013 15:24:15 ET T: 05/21/2013 15:39:28 ET JOB#: [id number]  cc: Ardeen FillersUzma S. Garnetta BuddyFaheem, MD, <Dictator> Rhunette CroftUZMA S Velta Rockholt MD ELECTRONICALLY SIGNED 05/23/2013 15:51

## 2014-10-13 ENCOUNTER — Emergency Department
Admission: EM | Admit: 2014-10-13 | Discharge: 2014-10-13 | Disposition: A | Discharge status: Home / Self Care | Payer: Medicare Other | Attending: Emergency Medicine | Admitting: Emergency Medicine

## 2014-10-13 ENCOUNTER — Encounter: Payer: Self-pay | Admitting: Emergency Medicine

## 2014-10-13 DIAGNOSIS — Z008 Encounter for other general examination: Secondary | ICD-10-CM | POA: Diagnosis present

## 2014-10-13 DIAGNOSIS — Z79899 Other long term (current) drug therapy: Secondary | ICD-10-CM | POA: Diagnosis not present

## 2014-10-13 DIAGNOSIS — F1310 Sedative, hypnotic or anxiolytic abuse, uncomplicated: Secondary | ICD-10-CM | POA: Insufficient documentation

## 2014-10-13 DIAGNOSIS — F1210 Cannabis abuse, uncomplicated: Secondary | ICD-10-CM | POA: Diagnosis not present

## 2014-10-13 DIAGNOSIS — F1129 Opioid dependence with unspecified opioid-induced disorder: Secondary | ICD-10-CM | POA: Diagnosis not present

## 2014-10-13 DIAGNOSIS — I10 Essential (primary) hypertension: Secondary | ICD-10-CM | POA: Diagnosis not present

## 2014-10-13 DIAGNOSIS — Z720 Tobacco use: Secondary | ICD-10-CM | POA: Insufficient documentation

## 2014-10-13 LAB — CBC
HEMATOCRIT: 41 % (ref 35.0–47.0)
HEMOGLOBIN: 13.3 g/dL (ref 12.0–16.0)
MCH: 30 pg (ref 26.0–34.0)
MCHC: 32.5 g/dL (ref 32.0–36.0)
MCV: 92.5 fL (ref 80.0–100.0)
Platelets: 427 10*3/uL (ref 150–440)
RBC: 4.43 MIL/uL (ref 3.80–5.20)
RDW: 14.9 % — ABNORMAL HIGH (ref 11.5–14.5)
WBC: 10.9 10*3/uL (ref 3.6–11.0)

## 2014-10-13 LAB — COMPREHENSIVE METABOLIC PANEL
ALBUMIN: 4.2 g/dL (ref 3.5–5.0)
ALT: 9 U/L — ABNORMAL LOW (ref 14–54)
AST: 22 U/L (ref 15–41)
Alkaline Phosphatase: 52 U/L (ref 38–126)
Anion gap: 9 (ref 5–15)
BUN: 12 mg/dL (ref 6–20)
CALCIUM: 9.3 mg/dL (ref 8.9–10.3)
CHLORIDE: 101 mmol/L (ref 101–111)
CO2: 27 mmol/L (ref 22–32)
CREATININE: 0.84 mg/dL (ref 0.44–1.00)
GFR calc Af Amer: >60 mL/min (ref >60)
GFR calc non Af Amer: >60 mL/min (ref >60)
Glucose, Bld: 112 mg/dL — ABNORMAL HIGH (ref 65–99)
Potassium: 3.7 mmol/L (ref 3.5–5.1)
Sodium: 137 mmol/L (ref 135–145)
Total Bilirubin: 0.3 mg/dL (ref 0.3–1.2)
Total Protein: 8.1 g/dL (ref 6.5–8.1)

## 2014-10-13 LAB — URINE DRUG SCREEN, QUALITATIVE (ARMC ONLY)
Amphetamines, Ur Screen: NOT DETECTED
Barbiturates, Ur Screen: NOT DETECTED
Benzodiazepine, Ur Scrn: POSITIVE — AB
COCAINE METABOLITE, UR ~~LOC~~: NOT DETECTED
Cannabinoid 50 Ng, Ur ~~LOC~~: POSITIVE — AB
MDMA (ECSTASY) UR SCREEN: NOT DETECTED
METHADONE SCREEN, URINE: NOT DETECTED
OPIATE, UR SCREEN: POSITIVE — AB
PHENCYCLIDINE (PCP) UR S: NOT DETECTED
Tricyclic, Ur Screen: NOT DETECTED

## 2014-10-13 LAB — SALICYLATE LEVEL

## 2014-10-13 LAB — ACETAMINOPHEN LEVEL

## 2014-10-13 LAB — ETHANOL: Alcohol, Ethyl (B): <5 mg/dL (ref <5)

## 2014-10-13 MED ORDER — ACETAMINOPHEN 325 MG PO TABS
ORAL_TABLET | ORAL | Status: AC
  Administered 2014-10-13: 650 mg via ORAL
  Filled 2014-10-13: qty 2

## 2014-10-13 MED ORDER — PROMETHAZINE HCL 25 MG PO TABS
25.0000 mg | ORAL_TABLET | Freq: Once | ORAL | Status: AC
  Administered 2014-10-13: 25 mg via ORAL

## 2014-10-13 MED ORDER — CLONIDINE HCL 0.1 MG PO TABS
ORAL_TABLET | ORAL | Status: AC
  Administered 2014-10-13: 0.1 mg via ORAL
  Filled 2014-10-13: qty 1

## 2014-10-13 MED ORDER — NICOTINE 21 MG/24HR TD PT24
21.0000 mg | MEDICATED_PATCH | Freq: Every day | TRANSDERMAL | Status: DC
  Administered 2014-10-13: 21 mg via TRANSDERMAL

## 2014-10-13 MED ORDER — CLONIDINE HCL 0.1 MG PO TABS
0.1000 mg | ORAL_TABLET | Freq: Three times a day (TID) | ORAL | Status: DC | PRN
Start: 2014-10-13 — End: 2014-10-14
  Administered 2014-10-13: 0.1 mg via ORAL

## 2014-10-13 MED ORDER — NICOTINE 21 MG/24HR TD PT24
MEDICATED_PATCH | TRANSDERMAL | Status: AC
  Administered 2014-10-13: 21 mg via TRANSDERMAL
  Filled 2014-10-13: qty 1

## 2014-10-13 MED ORDER — CLONIDINE HCL 0.1 MG PO TABS: 0.1000 mg | ORAL_TABLET | Freq: Three times a day (TID) | ORAL | Status: DC | PRN

## 2014-10-13 MED ORDER — ACETAMINOPHEN 325 MG PO TABS
650.0000 mg | ORAL_TABLET | Freq: Once | ORAL | Status: AC
  Administered 2014-10-13: 650 mg via ORAL

## 2014-10-13 MED ORDER — IBUPROFEN 800 MG PO TABS
800.0000 mg | ORAL_TABLET | Freq: Once | ORAL | Status: AC
  Administered 2014-10-13: 800 mg via ORAL

## 2014-10-13 MED ORDER — ONDANSETRON 4 MG PO TBDP
ORAL_TABLET | ORAL | Status: AC
  Administered 2014-10-13: 4 mg via ORAL
  Filled 2014-10-13: qty 1

## 2014-10-13 MED ORDER — ONDANSETRON 4 MG PO TBDP
4.0000 mg | ORAL_TABLET | Freq: Once | ORAL | Status: AC
  Administered 2014-10-13: 4 mg via ORAL

## 2014-10-13 MED ORDER — IBUPROFEN 800 MG PO TABS
ORAL_TABLET | ORAL | Status: AC
  Administered 2014-10-13: 800 mg via ORAL
  Filled 2014-10-13: qty 1

## 2014-10-13 MED ORDER — PROMETHAZINE HCL 25 MG PO TABS
ORAL_TABLET | ORAL | Status: AC
  Administered 2014-10-13: 25 mg via ORAL
  Filled 2014-10-13: qty 1

## 2014-10-13 NOTE — ED Notes (Signed)
Patient requesting nicotine patch. Verbal order from ED MD  

## 2014-10-13 NOTE — ED Provider Notes (Signed)
[facility] Emergency Department Provider Note    ____________________________________________  Time seen: 38  I have reviewed the triage vital signs and the nursing notes.   HISTORY  Chief Complaint Medical Clearance   History limited by: Not Limited   HPI Jeanne Leach is a 49 y.o. female who presents to the emergency department today with desire for detox. Patient states she has been abusing pain pills in here when. She has a long history of opiate dependency. States she did get clean back in January but was represcribed pain medication after a knee injury. She states her last use was yesterday. She has been having hot and cold flashes, vomiting and diarrhea. Additionally patient states she has a history of high blood pressure was working with her doctor to control this. She denies any fevers, chest pain, shortness of breath.    Past Medical History  Diagnosis Date  . Hypertension   . Degenerative disc disease, cervical   . Degenerative disc disease, lumbar   . Bipolar 1 disorder     Patient Active Problem List   Diagnosis Date Noted  . Sepsis due to urinary tract infection 08/08/2014  . Septic shock 08/08/2014  . Diabetes mellitus without complication 08/07/2014  . Fall 08/07/2014  . Hypertension   . Degenerative disc disease, cervical   . Bipolar 1 disorder   . AKI (acute kidney injury)     Past Surgical History  Procedure Laterality Date  . Cesarean section    . Lipoma excision      Current Outpatient Rx  Name  Route  Sig  Dispense  Refill  . alprazolam (XANAX) 2 MG tablet   Oral   Take 0.5 tablets (1 mg total) by mouth 3 (three) times daily as needed for sleep or anxiety.   30 tablet   0   . meclizine (ANTIVERT) 50 MG tablet   Oral   Take 1 tablet (50 mg total) by mouth 3 (three) times daily as needed for dizziness or nausea.   30 tablet   0   . Multiple Vitamin (MULTI-VITAMIN PO)   Oral   Take 1 tablet by mouth  daily.         . nicotine (NICODERM CQ - DOSED IN MG/24 HOURS) 14 mg/24hr patch   Transdermal   Place 1 patch (14 mg total) onto the skin daily. Patient not taking: Reported on 08/17/2014   28 patch   0   . Oxycodone HCl 20 MG TABS   Oral   Take 20 mg by mouth as needed (for pain).         Marland Kitchen oxyCODONE-acetaminophen (PERCOCET/ROXICET) 5-325 MG per tablet   Oral   Take 1 tablet by mouth every 4 (four) hours as needed for severe pain.         . valsartan (DIOVAN) 160 MG tablet   Oral   Take 1 tablet (160 mg total) by mouth daily.   30 tablet   0     Allergies Codeine  No family history on file.  Social History History  Substance Use Topics  . Smoking status: Current Every Day Smoker -- 1.00 packs/day  . Smokeless tobacco: Never Used  . Alcohol Use: No    Review of Systems Constitutional: Negative for fever. Cardiovascular: Negative for chest pain. Respiratory: Negative for shortness of breath. Gastrointestinal: Nausea and vomiting Genitourinary: Negative for dysuria. Musculoskeletal: Negative for back pain. Skin: Negative for rash. Neurological: Negative for headaches, focal weakness or numbness.  10-point ROS otherwise negative.  ____________________________________________   PHYSICAL EXAM:  VITAL SIGNS: ED Triage Vitals  Enc Vitals Group     BP 10/13/14 1002 249/144 mmHg     Pulse Rate 10/13/14 1002 101     Resp 10/13/14 1002 20     Temp 10/13/14 1002 97.7 F (36.5 C)     Temp Source 10/13/14 1002 Oral     SpO2 10/13/14 1002 100 %     Weight 10/13/14 1002 190 lb (86.183 kg)     Height 10/13/14 1002  (1.702 m)     Head Cir --      Peak Flow --      Pain Score 10/13/14 1014 9   Constitutional: Alert and oriented. Well appearing and in no distress. Eyes: Conjunctivae are normal. PERRL. Normal extraocular movements. ENT   Head: Normocephalic and atraumatic.   Nose: No congestion/rhinnorhea.   Mouth/Throat: Mucous membranes  are moist.   Neck: No stridor. Hematological/Lymphatic/Immunilogical: No cervical lymphadenopathy. Cardiovascular: Normal rate, regular rhythm.  No murmurs, rubs, or gallops. Respiratory: Normal respiratory effort without tachypnea nor retractions. Breath sounds are clear and equal bilaterally. No wheezes/rales/rhonchi. Gastrointestinal: Soft and nontender. No distention. There is no CVA tenderness. Genitourinary: Deferred Musculoskeletal: Normal range of motion in all extremities. No joint effusions.  No lower extremity tenderness nor edema. Neurologic:  Normal speech and language. No gross focal neurologic deficits are appreciated. Speech is normal.  Skin:  Skin is warm, dry and intact. No rash noted. Psychiatric: Mood and affect are normal. Speech and behavior are normal. Patient exhibits appropriate insight and judgment.  ____________________________________________    LABS (pertinent positives/negatives)  Labs Reviewed  ACETAMINOPHEN LEVEL - Abnormal; Notable for the following:    Acetaminophen (Tylenol), Serum <10 (*)    All other components within normal limits  CBC - Abnormal; Notable for the following:    RDW 14.9 (*)    All other components within normal limits  COMPREHENSIVE METABOLIC PANEL - Abnormal; Notable for the following:    Glucose, Bld 112 (*)    ALT 9 (*)    All other components within normal limits  URINE DRUG SCREEN, QUALITATIVE ([facility] ONLY) - Abnormal; Notable for the following:    Opiate, Ur Screen POSITIVE (*)    Cannabinoid 50 Ng, Ur [place] POSITIVE (*)    Benzodiazepine, Ur Scrn POSITIVE (*)    All other components within normal limits  ETHANOL  SALICYLATE LEVEL     ____________________________________________   EKG  none  ____________________________________________    RADIOLOGY  None  ____________________________________________   PROCEDURES  Procedure(s) performed: None  Critical Care performed:  No  ____________________________________________   INITIAL IMPRESSION / ASSESSMENT AND PLAN / ED COURSE  Pertinent labs & imaging results that were available during my care of the patient were reviewed by me and considered in my medical decision making (see chart for details).  Patient here voluntarily for opiate detox. Patient is hypertensive however states has history of similar. Will have Behavioral Health see patient.  ____________________________________________   FINAL CLINICAL IMPRESSION(S) / ED DIAGNOSES  Opiate dependence   Phineas Semen, MD 10/13/14 1651

## 2014-10-13 NOTE — Discharge Instructions (Signed)
Return to the ER for thoughts of wanting to hurt herself or for any other concerns. It is very important that you go to [facility] tomorrow to seek help.  Opioid Withdrawal Opioids are a group of narcotic drugs. They include the street drug heroin. They also include pain medicines, such as morphine, hydrocodone, oxycodone, and fentanyl. Opioid withdrawal is a group of characteristic physical and mental signs and symptoms. It typically occurs if you have been using opioids daily for several weeks or longer and stop using or rapidly decrease use. Opioid withdrawal can also occur if you have used opioids daily for a long time and are given a medicine to block the effect.  SIGNS AND SYMPTOMS Opioid withdrawal includes three or more of the following symptoms:   Depressed, anxious, or irritable mood.  Nausea or vomiting.  Muscle aches or spasms.   Watery eyes.   Runny nose.  Dilated pupils, sweating, or hairs standing on end.  Diarrhea or intestinal cramping.  Yawning.   Fever.  Increased blood pressure.  Fast pulse.  Restlessness or trouble sleeping. These signs and symptoms occur within several hours of stopping or reducing short-acting opioids, such as heroin. They can occur within 3 days of stopping or reducing long-acting opioids, such as methadone. Withdrawal begins within minutes of receiving a drug that blocks the effects of opioids, such as naltrexone or naloxone. DIAGNOSIS  Opioid use disorder is diagnosed by your health care provider. You will be asked about your symptoms, drug and alcohol use, medical history, and use of medicines. A physical exam may be done. Lab tests may be ordered. Your health care provider may have you see a mental health professional.  TREATMENT  The treatment for opioid withdrawal is usually provided by medical doctors with special training in substance use disorders (addiction specialists). The following medicines may be included in treatment:  Opioids  given in place of the abused opioid. They turn on opioid receptors in the brain and lessen or prevent withdrawal symptoms. They are gradually decreased (opioid substitution and taper).  Non-opioids that can lessen certain opioid withdrawal symptoms. They may be used alone or with opioid substitution and taper. Successful long-term recovery usually requires medicine, counseling, and group support. HOME CARE INSTRUCTIONS   Take medicines only as directed by your health care provider.  Check with your health care provider before starting new medicines.  Keep all follow-up visits as directed by your health care provider. SEEK MEDICAL CARE IF:  You are not able to take your medicines as directed.  Your symptoms get worse.  You relapse. SEEK IMMEDIATE MEDICAL CARE IF:  You have serious thoughts about hurting yourself or others.  You have a seizure.  You lose consciousness. Document Released: 04/21/2003 Document Revised: 09/02/2013 Document Reviewed: 05/01/2013 [facility] Patient Information 2015 [place], [place]. This information is not intended to replace advice given to you by your health care provider. Make sure you discuss any questions you have with your health care provider.

## 2014-10-13 NOTE — ED Notes (Signed)

## 2014-10-13 NOTE — ED Provider Notes (Signed)
-----------------------------------------   7:40 PM on 10/13/2014 -----------------------------------------  Patient had requested to leave. She is frustrated that she has not received anything for her withdrawal symptoms as she did the last time she went through detox. I discussed with patient that we would be able to treat her withdrawal symptoms in the ER with medications and will give her clonidine when necessary. I discussed with Claris Che, behavioral medicine nurse, who will contact [facility] for bed availability for further treatment. She understands that placement could take several days and is willing to wait as long as we are treating her symptoms. So understands that placement may not be with her husband who is also here for detox.  ----------------------------------------- 10:28 PM on 10/13/2014 -----------------------------------------  Claris Che discussed treatment options with patient and she now prefers to receive treatment at [facility] intensive outpatient. Patient is not a risk to herself or others she may proceed with this option.  Maurilio Lovely, MD 10/13/14 2229

## 2014-10-13 NOTE — ED Notes (Signed)

## 2014-10-13 NOTE — ED Notes (Signed)
Visitor at bedside.

## 2014-10-13 NOTE — ED Notes (Signed)
BEHAVIORAL HEALTH ROUNDING Patient sleeping: No. Patient alert and oriented: yes Behavior appropriate: Yes.   Nutrition and fluids offered: Yes  Toileting and hygiene offered: Yes  Sitter present: q15 min observations and security camera monitoring Law enforcement present: Yes [employer]  ENVIRONMENTAL ASSESSMENT Potentially harmful objects out of patient reach: Yes.   Personal belongings secured: Yes.   Patient dressed in hospital provided attire only: Yes.   Plastic bags out of patient reach: Yes.   Patient care equipment (cords, cables, call bells, lines, and drains) shortened, removed, or accounted for: Yes.   Equipment and supplies removed from bottom of stretcher: Yes.   Potentially toxic materials out of patient reach: Yes.   Sharps container removed or out of patient reach: Yes.   

## 2014-10-13 NOTE — BH Assessment (Signed)
Assessment Note  Jeanne Leach is an 49 y.o. female, who presents to the ED via her daughter and husband for detox from benzos and opiates; client's husband is also here for detox from the same substances. Per client, "I came here 1 1/2 years ago for detox and was clean for several months. Jan. '2015, "I broke my knee and was put back on pain medicine; I have been abusing them; up to (30) pills a day; I don't want to be a "pill head"; I came here because, "I'm tired of living on a roller coaster. "My mom is on my back; I borrow money from her each month to buy pills from $200.00 to $400.00 a month to buy pills; this habit is costing way too much; I'm not suicidal; but this is too much."   Axis I: Generalized Anxiety Disorder and Substance Abuse Axis II: Deferred Axis III:  Past Medical History  Diagnosis Date  . Hypertension   . Degenerative disc disease, cervical   . Degenerative disc disease, lumbar   . Bipolar 1 disorder    Axis IV: economic problems, other psychosocial or environmental problems and problems with access to health care services Axis V: 51-60 moderate symptoms  Past Medical History:  Past Medical History  Diagnosis Date  . Hypertension   . Degenerative disc disease, cervical   . Degenerative disc disease, lumbar   . Bipolar 1 disorder     Past Surgical History  Procedure Laterality Date  . Cesarean section    . Lipoma excision      Family History: No family history on file.  Social History:  reports that she has been smoking.  She has never used smokeless tobacco. She reports that she uses illicit drugs. She reports that she does not drink alcohol.  Additional Social History:     CIWA: CIWA-Ar BP: (!) 179/107 mmHg Pulse Rate: 68 COWS:    Allergies:  Allergies  Allergen Reactions  . Codeine Rash    Home Medications:  (Not in a hospital admission)  OB/GYN Status:  Patient's last menstrual period was 10/06/2014.  General Assessment Data Location  of Assessment: [facility] ED [facility]TS Assessment: In system Is this a Tele or Face-to-Face Assessment?: Face-to-Face Is this an Initial Assessment or a Re-assessment for this encounter?: Re-Assessment Marital status: Married Clifford name: Dan Humphreys Is patient pregnant?: No Pregnancy Status: No Living Arrangements: Spouse/significant other, Children Can pt return to current living arrangement?: Yes Admission Status: Voluntary Is patient capable of signing voluntary admission?: Yes Referral Source: Self/Family/Friend Insurance type: Medicare  Medical Screening Exam [facility] Walk-in ONLY) Medical Exam completed: Yes  Crisis Care Plan Living Arrangements: Spouse/significant other, Children Name of Psychiatrist: none Name of Therapist: none  Education Status Is patient currently in school?: No Current Grade: n/a Highest grade of school patient has completed: 9th Name of school: n/a Contact person: daughter and husband  Risk to self with the past 6 months Suicidal Ideation: No Has patient been a risk to self within the past 6 months prior to admission? : No Suicidal Intent: No Has patient had any suicidal intent within the past 6 months prior to admission? : No Is patient at risk for suicide?: Yes (substance use/abuse) Suicidal Plan?: No Has patient had any suicidal plan within the past 6 months prior to admission? : No Access to Means: No What has been your use of drugs/alcohol within the last 12 months?: benzo; opiate; cannabis Previous Attempts/Gestures: No How many times?: 0 Other Self Harm Risks: 0  Triggers for Past Attempts: None known Intentional Self Injurious Behavior: None Family Suicide History: No Recent stressful life event(s): Conflict (Comment) (conflict with her mother; financial) Persecutory voices/beliefs?: No Depression: Yes Depression Symptoms: Feeling worthless/self pity, Tearfulness Substance abuse history and/or treatment for substance abuse?: Yes Suicide prevention  information given to non-admitted patients: Not applicable  Risk to Others within the past 6 months Homicidal Ideation: No Does patient have any lifetime risk of violence toward others beyond the six months prior to admission? : No Thoughts of Harm to Others: No Current Homicidal Intent: No Current Homicidal Plan: No Access to Homicidal Means: No Identified Victim: none History of harm to others?: No Assessment of Violence: On admission Violent Behavior Description: none Does patient have access to weapons?: No Criminal Charges Pending?: No Does patient have a court date: No Is patient on probation?: No  Psychosis Hallucinations: None noted Delusions: None noted  Mental Status Report Appearance/Hygiene: In scrubs, Unremarkable Eye Contact: Fair Motor Activity: Unsteady Speech: Logical/coherent Level of Consciousness: Alert Mood: Anxious Affect: Anxious Anxiety Level: Minimal Thought Processes: Circumstantial Judgement: Partial Orientation: Person, Place, Situation Obsessive Compulsive Thoughts/Behaviors: Minimal  Cognitive Functioning Concentration: Fair Memory: Recent Intact, Remote Intact IQ: Average Insight: Fair Impulse Control: Fair Appetite: Fair Weight Loss: 0 Weight Gain: 0 Sleep: No Change Total Hours of Sleep: 5 Vegetative Symptoms: None  ADLScreening [facility] Assessment Services) Patient's cognitive ability adequate to safely complete daily activities?: Yes Patient able to express need for assistance with ADLs?: Yes Independently performs ADLs?: Yes (appropriate for developmental age)  Prior Inpatient Therapy Prior Inpatient Therapy: Yes Prior Therapy Dates: 1 1/2 years ago; per client Prior Therapy Facilty/Provider(s): [facility] [place] Reason for Treatment: detox  Prior Outpatient Therapy Prior Outpatient Therapy: No Does patient have an ACCT team?: No Does patient have Intensive In-House Services?  : No Does patient have [facility] services? : No Does  patient have P4CC services?: No  ADL Screening (condition at time of admission) Patient's cognitive ability adequate to safely complete daily activities?: Yes Patient able to express need for assistance with ADLs?: Yes Independently performs ADLs?: Yes (appropriate for developmental age)       Abuse/Neglect Assessment (Assessment to be complete while patient is alone) Physical Abuse: Denies Verbal Abuse: Denies Sexual Abuse: Denies Exploitation of patient/patient's resources: Denies Self-Neglect: Denies Values / Beliefs Cultural Requests During Hospitalization: None Spiritual Requests During Hospitalization: None Consults Spiritual Care Consult Needed: No Social Work Consult Needed: No Merchant navy officer (For Healthcare) Does patient have an advance directive?: No Would patient like information on creating an advanced directive?: No - patient declined information    Additional Information 1:1 In Past 12 Months?: No CIRT Risk: No Elopement Risk: No Does patient have medical clearance?: Yes  Child/Adolescent Assessment Running Away Risk: Denies Bed-Wetting: Denies Destruction of Property: Denies Cruelty to Animals: Denies Stealing: Denies Rebellious/Defies Authority: Denies Satanic Involvement: Denies Archivist: Denies Problems at [employer]: Denies Gang Involvement: Denies  Disposition:  Disposition Initial Assessment Completed for this Encounter: Yes Disposition of Patient: Referred to (Psych MD to see) Patient referred to: Other (Comment) (possibly [facility])  On Site Evaluation by:   Reviewed with Physician:    Dwan Bolt 10/13/2014 5:35 PM

## 2014-10-13 NOTE — ED Notes (Signed)
BEHAVIORAL HEALTH ROUNDING Patient sleeping: No. Patient alert and oriented: yes Behavior appropriate: Yes.   Nutrition and fluids offered: Yes  Toileting and hygiene offered: Yes  Sitter present: q15 min observations Law enforcement present: Yes [employer] 

## 2014-10-13 NOTE — ED Notes (Signed)
Here for detox eval for snorting oxycodone

## 2014-11-03 ENCOUNTER — Emergency Department: Payer: Medicare Other

## 2014-11-03 ENCOUNTER — Inpatient Hospital Stay
Admission: EM | Admit: 2014-11-03 | Discharge: 2014-11-06 | DRG: 896 | Disposition: A | Discharge status: Home / Self Care | Payer: Medicare Other | Attending: Internal Medicine | Admitting: Internal Medicine

## 2014-11-03 ENCOUNTER — Encounter: Payer: Self-pay | Admitting: Emergency Medicine

## 2014-11-03 DIAGNOSIS — M5136 Other intervertebral disc degeneration, lumbar region: Secondary | ICD-10-CM | POA: Diagnosis not present

## 2014-11-03 DIAGNOSIS — Z79899 Other long term (current) drug therapy: Secondary | ICD-10-CM

## 2014-11-03 DIAGNOSIS — F1114 Opioid abuse with opioid-induced mood disorder: Principal | ICD-10-CM | POA: Diagnosis present

## 2014-11-03 DIAGNOSIS — F1320 Sedative, hypnotic or anxiolytic dependence, uncomplicated: Secondary | ICD-10-CM | POA: Diagnosis not present

## 2014-11-03 DIAGNOSIS — T424X2A Poisoning by benzodiazepines, intentional self-harm, initial encounter: Secondary | ICD-10-CM | POA: Diagnosis not present

## 2014-11-03 DIAGNOSIS — T402X1A Poisoning by other opioids, accidental (unintentional), initial encounter: Secondary | ICD-10-CM | POA: Diagnosis present

## 2014-11-03 DIAGNOSIS — T50901A Poisoning by unspecified drugs, medicaments and biological substances, accidental (unintentional), initial encounter: Secondary | ICD-10-CM | POA: Diagnosis present

## 2014-11-03 DIAGNOSIS — N179 Acute kidney failure, unspecified: Secondary | ICD-10-CM

## 2014-11-03 DIAGNOSIS — F1194 Opioid use, unspecified with opioid-induced mood disorder: Secondary | ICD-10-CM | POA: Diagnosis present

## 2014-11-03 DIAGNOSIS — I959 Hypotension, unspecified: Secondary | ICD-10-CM | POA: Diagnosis not present

## 2014-11-03 DIAGNOSIS — J181 Lobar pneumonia, unspecified organism: Secondary | ICD-10-CM

## 2014-11-03 DIAGNOSIS — G8929 Other chronic pain: Secondary | ICD-10-CM | POA: Diagnosis present

## 2014-11-03 DIAGNOSIS — R410 Disorientation, unspecified: Secondary | ICD-10-CM

## 2014-11-03 DIAGNOSIS — Z79891 Long term (current) use of opiate analgesic: Secondary | ICD-10-CM | POA: Diagnosis not present

## 2014-11-03 DIAGNOSIS — T402X2A Poisoning by other opioids, intentional self-harm, initial encounter: Secondary | ICD-10-CM | POA: Diagnosis present

## 2014-11-03 DIAGNOSIS — J189 Pneumonia, unspecified organism: Secondary | ICD-10-CM

## 2014-11-03 DIAGNOSIS — J690 Pneumonitis due to inhalation of food and vomit: Secondary | ICD-10-CM | POA: Diagnosis present

## 2014-11-03 DIAGNOSIS — I10 Essential (primary) hypertension: Secondary | ICD-10-CM | POA: Diagnosis not present

## 2014-11-03 DIAGNOSIS — F1910 Other psychoactive substance abuse, uncomplicated: Secondary | ICD-10-CM | POA: Diagnosis not present

## 2014-11-03 DIAGNOSIS — F319 Bipolar disorder, unspecified: Secondary | ICD-10-CM | POA: Diagnosis present

## 2014-11-03 DIAGNOSIS — F410 Panic disorder [episodic paroxysmal anxiety] without agoraphobia: Secondary | ICD-10-CM | POA: Diagnosis not present

## 2014-11-03 DIAGNOSIS — T402X4A Poisoning by other opioids, undetermined, initial encounter: Secondary | ICD-10-CM

## 2014-11-03 DIAGNOSIS — M549 Dorsalgia, unspecified: Secondary | ICD-10-CM | POA: Diagnosis present

## 2014-11-03 DIAGNOSIS — F17210 Nicotine dependence, cigarettes, uncomplicated: Secondary | ICD-10-CM | POA: Diagnosis not present

## 2014-11-03 DIAGNOSIS — F1120 Opioid dependence, uncomplicated: Secondary | ICD-10-CM | POA: Diagnosis present

## 2014-11-03 HISTORY — DX: Anxiety disorder, unspecified: F41.9

## 2014-11-03 LAB — CBC
HCT: 37.9 % (ref 35.0–47.0)
Hemoglobin: 12.4 g/dL (ref 12.0–16.0)
MCH: 30.3 pg (ref 26.0–34.0)
MCHC: 32.8 g/dL (ref 32.0–36.0)
MCV: 92.4 fL (ref 80.0–100.0)
Platelets: 303 10*3/uL (ref 150–440)
RBC: 4.11 MIL/uL (ref 3.80–5.20)
RDW: 14.4 % (ref 11.5–14.5)
WBC: 20 10*3/uL — ABNORMAL HIGH (ref 3.6–11.0)

## 2014-11-03 LAB — URINALYSIS COMPLETE WITH MICROSCOPIC (ARMC ONLY)
Bacteria, UA: NONE SEEN
Bilirubin Urine: NEGATIVE
GLUCOSE, UA: NEGATIVE mg/dL
Ketones, ur: NEGATIVE mg/dL
Leukocytes, UA: NEGATIVE
Nitrite: NEGATIVE
PH: 5 (ref 5.0–8.0)
PROTEIN: NEGATIVE mg/dL
Specific Gravity, Urine: 1.01 (ref 1.005–1.030)

## 2014-11-03 LAB — COMPREHENSIVE METABOLIC PANEL
ALBUMIN: 4.1 g/dL (ref 3.5–5.0)
ALK PHOS: 55 U/L (ref 38–126)
ALT: 12 U/L — AB (ref 14–54)
AST: 27 U/L (ref 15–41)
Anion gap: 9 (ref 5–15)
BUN: 19 mg/dL (ref 6–20)
CO2: 27 mmol/L (ref 22–32)
Calcium: 9.3 mg/dL (ref 8.9–10.3)
Chloride: 101 mmol/L (ref 101–111)
Creatinine, Ser: 2.23 mg/dL — ABNORMAL HIGH (ref 0.44–1.00)
GFR calc non Af Amer: 25 mL/min — ABNORMAL LOW (ref >60)
GFR, EST AFRICAN AMERICAN: 29 mL/min — AB (ref >60)
Glucose, Bld: 114 mg/dL — ABNORMAL HIGH (ref 65–99)
POTASSIUM: 4.9 mmol/L (ref 3.5–5.1)
Sodium: 137 mmol/L (ref 135–145)
TOTAL PROTEIN: 8 g/dL (ref 6.5–8.1)
Total Bilirubin: 0.4 mg/dL (ref 0.3–1.2)

## 2014-11-03 LAB — URINE DRUG SCREEN, QUALITATIVE (ARMC ONLY)
AMPHETAMINES, UR SCREEN: NOT DETECTED
BENZODIAZEPINE, UR SCRN: POSITIVE — AB
Barbiturates, Ur Screen: NOT DETECTED
COCAINE METABOLITE, UR ~~LOC~~: NOT DETECTED
Cannabinoid 50 Ng, Ur ~~LOC~~: POSITIVE — AB
MDMA (Ecstasy)Ur Screen: NOT DETECTED
METHADONE SCREEN, URINE: NOT DETECTED
Opiate, Ur Screen: POSITIVE — AB
PHENCYCLIDINE (PCP) UR S: NOT DETECTED
Tricyclic, Ur Screen: NOT DETECTED

## 2014-11-03 LAB — MRSA PCR SCREENING: MRSA by PCR: NEGATIVE

## 2014-11-03 LAB — ACETAMINOPHEN LEVEL

## 2014-11-03 LAB — ETHANOL

## 2014-11-03 LAB — SALICYLATE LEVEL

## 2014-11-03 MED ORDER — DIPHENHYDRAMINE HCL 50 MG/ML IJ SOLN
INTRAMUSCULAR | Status: AC
  Administered 2014-11-03: 25 mg via INTRAMUSCULAR
  Filled 2014-11-03: qty 1

## 2014-11-03 MED ORDER — SODIUM CHLORIDE 0.9 % IV SOLN
INTRAVENOUS | Status: DC
  Administered 2014-11-03 – 2014-11-05 (×5): via INTRAVENOUS

## 2014-11-03 MED ORDER — DIPHENHYDRAMINE HCL 50 MG/ML IJ SOLN
25.0000 mg | Freq: Once | INTRAMUSCULAR | Status: AC
  Administered 2014-11-03: 25 mg via INTRAMUSCULAR

## 2014-11-03 MED ORDER — NALOXONE HCL 1 MG/ML IJ SOLN
0.2500 mg/h | INTRAVENOUS | Status: DC
  Administered 2014-11-03: 0.25 mg/h via INTRAVENOUS
  Filled 2014-11-03: qty 4

## 2014-11-03 MED ORDER — IRBESARTAN 75 MG PO TABS: 37.5000 mg | ORAL_TABLET | Freq: Every day | ORAL | Status: DC

## 2014-11-03 MED ORDER — ENOXAPARIN SODIUM 40 MG/0.4ML ~~LOC~~ SOLN
40.0000 mg | SUBCUTANEOUS | Status: DC
  Administered 2014-11-03 – 2014-11-04 (×2): 40 mg via SUBCUTANEOUS
  Filled 2014-11-03 (×3): qty 0.4

## 2014-11-03 MED ORDER — ONDANSETRON HCL 4 MG PO TABS: 4.0000 mg | ORAL_TABLET | Freq: Four times a day (QID) | ORAL | Status: DC | PRN

## 2014-11-03 MED ORDER — NALOXONE HCL 1 MG/ML IJ SOLN
INTRAMUSCULAR | Status: AC
  Administered 2014-11-03: 0.5 mg via INTRAVENOUS
  Filled 2014-11-03: qty 2

## 2014-11-03 MED ORDER — PNEUMOCOCCAL VAC POLYVALENT 25 MCG/0.5ML IJ INJ
0.5000 mL | INJECTION | INTRAMUSCULAR | Status: AC
  Administered 2014-11-05: 0.5 mL via INTRAMUSCULAR
  Filled 2014-11-03 (×2): qty 0.5

## 2014-11-03 MED ORDER — ACETAMINOPHEN 650 MG RE SUPP
650.0000 mg | Freq: Four times a day (QID) | RECTAL | Status: DC | PRN
Start: 2014-11-03 — End: 2014-11-06

## 2014-11-03 MED ORDER — HALOPERIDOL LACTATE 5 MG/ML IJ SOLN
INTRAMUSCULAR | Status: AC
  Administered 2014-11-03: 5 mg via INTRAMUSCULAR
  Filled 2014-11-03: qty 1

## 2014-11-03 MED ORDER — SODIUM CHLORIDE 0.9 % IV BOLUS (SEPSIS)
500.0000 mL | Freq: Once | INTRAVENOUS | Status: AC
  Administered 2014-11-03: 500 mL via INTRAVENOUS

## 2014-11-03 MED ORDER — CEFTRIAXONE SODIUM IN DEXTROSE 20 MG/ML IV SOLN
INTRAVENOUS | Status: AC
  Administered 2014-11-03: 1 g via INTRAVENOUS
  Filled 2014-11-03: qty 50

## 2014-11-03 MED ORDER — NICOTINE 14 MG/24HR TD PT24
14.0000 mg | MEDICATED_PATCH | Freq: Every day | TRANSDERMAL | Status: DC
  Administered 2014-11-03 – 2014-11-05 (×3): 14 mg via TRANSDERMAL
  Filled 2014-11-03 (×3): qty 1

## 2014-11-03 MED ORDER — SODIUM CHLORIDE 0.9 % IV BOLUS (SEPSIS)
1000.0000 mL | INTRAVENOUS | Status: AC
  Administered 2014-11-03: 1000 mL via INTRAVENOUS

## 2014-11-03 MED ORDER — CEFTRIAXONE SODIUM IN DEXTROSE 20 MG/ML IV SOLN
1.0000 g | INTRAVENOUS | Status: AC
  Administered 2014-11-03: 1 g via INTRAVENOUS

## 2014-11-03 MED ORDER — MECLIZINE HCL 25 MG PO TABS
50.0000 mg | ORAL_TABLET | Freq: Three times a day (TID) | ORAL | Status: DC | PRN
Start: 2014-11-03 — End: 2014-11-05

## 2014-11-03 MED ORDER — SODIUM CHLORIDE 0.9 % IV SOLN
Freq: Once | INTRAVENOUS | Status: AC
  Administered 2014-11-03: 15:00:00 via INTRAVENOUS

## 2014-11-03 MED ORDER — AMPICILLIN-SULBACTAM SODIUM 3 (2-1) G IJ SOLR
3.0000 g | Freq: Three times a day (TID) | INTRAMUSCULAR | Status: DC
  Administered 2014-11-03 – 2014-11-06 (×9): 3 g via INTRAVENOUS
  Filled 2014-11-03 (×12): qty 3

## 2014-11-03 MED ORDER — ONDANSETRON HCL 4 MG/2ML IJ SOLN
4.0000 mg | Freq: Four times a day (QID) | INTRAMUSCULAR | Status: DC | PRN
Start: 2014-11-03 — End: 2014-11-06

## 2014-11-03 MED ORDER — MIDAZOLAM HCL 2 MG/2ML IJ SOLN
2.0000 mg | Freq: Once | INTRAMUSCULAR | Status: AC
  Administered 2014-11-03: 2 mg via INTRAMUSCULAR

## 2014-11-03 MED ORDER — MIDAZOLAM HCL 5 MG/5ML IJ SOLN
INTRAMUSCULAR | Status: AC
  Administered 2014-11-03: 2 mg via INTRAMUSCULAR
  Filled 2014-11-03: qty 5

## 2014-11-03 MED ORDER — CLINDAMYCIN PHOSPHATE 600 MG/50ML IV SOLN
600.0000 mg | Freq: Once | INTRAVENOUS | Status: AC
  Administered 2014-11-03: 600 mg via INTRAVENOUS

## 2014-11-03 MED ORDER — CLONIDINE HCL 0.1 MG PO TABS: 0.1000 mg | ORAL_TABLET | Freq: Three times a day (TID) | ORAL | Status: DC | PRN

## 2014-11-03 MED ORDER — CLINDAMYCIN PHOSPHATE 600 MG/50ML IV SOLN
INTRAVENOUS | Status: AC
  Administered 2014-11-03: 600 mg via INTRAVENOUS
  Filled 2014-11-03: qty 50

## 2014-11-03 MED ORDER — ACETAMINOPHEN 325 MG PO TABS
650.0000 mg | ORAL_TABLET | Freq: Four times a day (QID) | ORAL | Status: DC | PRN
  Administered 2014-11-03 – 2014-11-05 (×2): 650 mg via ORAL
  Filled 2014-11-03 (×2): qty 2

## 2014-11-03 MED ORDER — NALOXONE HCL 1 MG/ML IJ SOLN
0.5000 mg | Freq: Once | INTRAMUSCULAR | Status: AC
  Administered 2014-11-03: 0.5 mg via INTRAVENOUS

## 2014-11-03 MED ORDER — HALOPERIDOL LACTATE 5 MG/ML IJ SOLN
5.0000 mg | Freq: Once | INTRAMUSCULAR | Status: AC
  Administered 2014-11-03: 5 mg via INTRAMUSCULAR

## 2014-11-03 NOTE — ED Notes (Signed)
After narcan administration pt somewhat more awake, able to keep eyes open, shivering

## 2014-11-03 NOTE — ED Notes (Signed)
Dr. Carollee MassedKaminski and York CeriseForbach notified of BP of 78/46, narcan gtt started, pt placed in trendelenburg, will continue to monitor

## 2014-11-03 NOTE — ED Provider Notes (Signed)
[facility] Emergency Department Provider Note  ____________________________________________  Time seen: Approximately 1200 PM  I have reviewed the triage vital signs and the nursing notes.   HISTORY  Chief Complaint Drug Overdose  History limited by AMS/somnolence from drug overdose  HPI Jeanne Leach is a 49 y.o. female with a history of drug abuse and unspecified mental health issues presents altered and somnolent.  Reportedly she took a large but unspecified quantity of narcotics at home.  When EMS arrived they found her female companion flushing the drugs down the toilet, so quantity cannot be verified.  She was breathing agonally when EMS arrived and was bagged until she awoke somewhat with narcan 2 mg IN, then another 2 mg IV.  She is somnolent but responds to noxious stimuli and loud voice upon arrival in the ED.  Additional details about the circumstances cannot be obtained at this time.   Past Medical History  Diagnosis Date  . Hypertension   . Degenerative disc disease, cervical   . Degenerative disc disease, lumbar   . Bipolar 1 disorder   . Degenerative disc disease, cervical   . Anxiety   . Chronic back pain     Patient Active Problem List   Diagnosis Date Noted  . Sepsis due to urinary tract infection 08/08/2014  . Septic shock 08/08/2014  . Diabetes mellitus without complication 08/07/2014  . Fall 08/07/2014  . Hypertension   . Degenerative disc disease, cervical   . Bipolar 1 disorder   . AKI (acute kidney injury)     Past Surgical History  Procedure Laterality Date  . Cesarean section    . Lipoma excision      Current Outpatient Rx  Name  Route  Sig  Dispense  Refill  . alprazolam (XANAX) 2 MG tablet   Oral   Take 0.5 tablets (1 mg total) by mouth 3 (three) times daily as needed for sleep or anxiety. Patient taking differently: Take 2 mg by mouth every 6 (six) hours as needed for anxiety.    30 tablet   0   . cloNIDine  (CATAPRES) 0.1 MG tablet   Oral   Take 1 tablet (0.1 mg total) by mouth 3 (three) times daily as needed (withdrawl symptoms).   30 tablet   0   . meclizine (ANTIVERT) 50 MG tablet   Oral   Take 1 tablet (50 mg total) by mouth 3 (three) times daily as needed for dizziness or nausea. Patient not taking: Reported on 10/13/2014   30 tablet   0   . nicotine (NICODERM CQ - DOSED IN MG/24 HOURS) 14 mg/24hr patch   Transdermal   Place 1 patch (14 mg total) onto the skin daily. Patient not taking: Reported on 08/17/2014   28 patch   0   . Oxycodone HCl 20 MG TABS   Oral   Take 1 tablet by mouth every 4 (four) hours as needed (for pain).         . valsartan (DIOVAN) 160 MG tablet   Oral   Take 1 tablet (160 mg total) by mouth daily. Patient not taking: Reported on 10/13/2014   30 tablet   0   . valsartan-hydrochlorothiazide (DIOVAN-HCT) 320-25 MG per tablet   Oral   Take 1 tablet by mouth daily.           Allergies Codeine  No family history on file.  Social History History  Substance Use Topics  . Smoking status: Current Every Day  Smoker -- 1.00 packs/day  . Smokeless tobacco: Never Used  . Alcohol Use: No    Review of Systems Unable to obtain due to AMS/somnolence/drug overdose ____________________________________________   PHYSICAL EXAM:  VITAL SIGNS: ED Triage Vitals  Enc Vitals Group     BP 11/03/14 1224 122/87 mmHg     Pulse Rate 11/03/14 1224 108     Resp 11/03/14 1224 22     Temp 11/03/14 1224 100.3 F (37.9 C)     Temp Source 11/03/14 1224 Oral     SpO2 11/03/14 1224 93 %     Weight 11/03/14 1224 190 lb (86.183 kg)     Height 11/03/14 1224 5\' 7"  (1.702 m)     Head Cir --      Peak Flow --      Pain Score 11/03/14 1225 7     Pain Loc --      Pain Edu? --      Excl. in GC? --     Constitutional: Somnolent, pale, ill-appearing.  Sonorous (but not agonal respirations) Eyes: Conjunctivae are normal. Miotic pupils, minimally reactive.   EOMI. Head: Atraumatic. Nose: No congestion/rhinnorhea. Mouth/Throat: Mucous membranes are moist.  Oropharynx non-erythematous. Neck: No stridor.  No cervical spine tenderness to palpation w/o stepoffs or deformities Cardiovascular: Normal rate, regular rhythm. Grossly normal heart sounds.  Good peripheral circulation. Respiratory: Normal respiratory effort.  No retractions. Lungs CTAB. Gastrointestinal: Soft and nontender. No distention. No abdominal bruits. No CVA tenderness. Musculoskeletal: No lower extremity tenderness nor edema.  No joint effusions. Neurologic:  Normal speech and language. No gross focal neurologic deficits are appreciated. Speech is normal. Skin:  Skin is warm, dry and intact. No rash noted. Psychiatric: Somnolent, unable to give hx  ____________________________________________   LABS (all labs ordered are listed, but only abnormal results are displayed)  Labs Reviewed  ACETAMINOPHEN LEVEL - Abnormal; Notable for the following:    Acetaminophen (Tylenol), Serum <10 (*)    All other components within normal limits  CBC - Abnormal; Notable for the following:    WBC 20.0 (*)    All other components within normal limits  COMPREHENSIVE METABOLIC PANEL - Abnormal; Notable for the following:    Glucose, Bld 114 (*)    Creatinine, Ser 2.23 (*)    ALT 12 (*)    GFR calc non Af Amer 25 (*)    GFR calc Af Amer 29 (*)    All other components within normal limits  URINE DRUG SCREEN, QUALITATIVE ([facility] ONLY) - Abnormal; Notable for the following:    Opiate, Ur Screen POSITIVE (*)    Cannabinoid 50 Ng, Ur [place] POSITIVE (*)    Benzodiazepine, Ur Scrn POSITIVE (*)    All other components within normal limits  URINALYSIS COMPLETEWITH MICROSCOPIC ([facility] ONLY) - Abnormal; Notable for the following:    Color, Urine YELLOW (*)    APPearance CLEAR (*)    Hgb urine dipstick 1+ (*)    Squamous Epithelial / LPF 0-5 (*)    All other components within normal limits  URINE CULTURE   ETHANOL  SALICYLATE LEVEL   ____________________________________________  EKG  ED ECG REPORT I, Zanyiah Posten, the attending physician, personally viewed and interpreted this ECG.   Date: 11/03/2014  EKG Time: 12:14  Rate: 111  Rhythm: sinus tachycardia  Axis: normal  Intervals:none  ST&T Change: Non-specific ST segment / T-wave changes, but no evidence of acute ischemia.  Heavy artifact is present.  ____________________________________________  RADIOLOGY Aura Camps,  Lessie Funderburke, personally viewed and evaluated these images as part of my medical decision making.    Dg Chest Portable 1 View  11/03/2014   CLINICAL DATA:  Hypertension. Anxiety. Back pain. Fever and leukocytosis.  EXAM: PORTABLE CHEST - 1 VIEW  COMPARISON:  None.  FINDINGS: Patient has taken a poor inspiration. Artifact overlies the chest. Heart size is normal. Left lungs clear. There may be mild patchy density at the right base. No dense consolidation or lobar collapse. No effusion. Bony structures are unremarkable.  IMPRESSION: Question mild patchy density at the right base. Consider two-view chest radiography if concern persists.   Electronically Signed   By: Paulina FusiMark  Shogry M.D.   On: 11/03/2014 13:22    ____________________________________________   PROCEDURES  Procedure(s) performed: None  Critical Care performed: Yes, see critical care note(s)   CRITICAL CARE Performed by: Loleta RoseFORBACH, Israel Werts   Total critical care time: 45 minutes  Critical care time was exclusive of separately billable procedures and treating other patients.  Critical care was necessary to treat or prevent imminent or life-threatening deterioration.  Critical care was time spent personally by me on the following activities: development of treatment plan with patient and/or surrogate as well as nursing, discussions with consultants, evaluation of patient's response to treatment, examination of patient, obtaining history from patient or surrogate,  ordering and performing treatments and interventions, ordering and review of laboratory studies, ordering and review of radiographic studies, pulse oximetry and re-evaluation of patient's condition.  ____________________________________________   INITIAL IMPRESSION / ASSESSMENT AND PLAN / ED COURSE  Pertinent labs & imaging results that were available during my care of the patient were reviewed by me and considered in my medical decision making (see chart for details).  The patient has overdosed on narcotics and the intention is unclear (accidental versus intentional suicide attempt).  Her airway is protected at this time, we will perform a standard overdose evaluation reassess.  (Note that documentation was delayed due to multiple ED patients requiring immediate care.) The patient's labs are notable for an elevated creatinine at 2.23 indicating some degree of acute kidney injury, a leukocytosis of 20, and she also has a probable pneumonia in the right base.  Given her history of narcotics abuse I am concerned this may represent an aspiration pneumonia.  I am treating with ceftriaxone 1 g IV and clindamycin 600 mg IV.  Patient remained somnolent has gotten 1 L normal saline.  I will continue to monitor.   (Note that documentation was delayed due to multiple ED patients requiring immediate care.) The patient became less responsive and began agonal breathing once again.  The nurses noticed this immediately and we administered Narcan 0.5 mg IV.  She immediately responded by becoming more awake and demonstrating piloerection.  Given the unknown quantity and the persistent decompensation, as well as the medical comorbidities, I will admit for further treatment.  I am providing the patient under IVC given her overdose and unclear intentions with a history of the same.   (Note that documentation was delayed due to multiple ED patients requiring immediate care.) The patient became increasingly agitated  and ripped out one of her IVs, stating that she is going to leave.  Her daughter is at bedside.  We explained to the patient and her daughter why she cannot leave at this time and her daughter understands and agrees, but the patient does not have capacity to make her own decisions at this time.  Her hands are tremulous and she is agitated.  I am administering Haldol 5 mg IM, 2 mg of Versed IM (of note, benzodiazepine also showed up on her urine tox screen) and Benadryl 25 mg IM.  She will remain on the monitor but this treatment should help with her agitation.  We will continue the plan for a very low dose of Narcan 0.25 mgs per hour.    ____________________________________________  FINAL CLINICAL IMPRESSION(S) / ED DIAGNOSES  Final diagnoses:  Right lower lobe pneumonia  Acute renal failure, unspecified acute renal failure type  Opioid overdose, undetermined intent, initial encounter      NEW MEDICATIONS STARTED DURING THIS VISIT:  New Prescriptions   No medications on file     Loleta Rose, MD 11/03/14 1546

## 2014-11-03 NOTE — ED Notes (Signed)
MD at bedside. 

## 2014-11-03 NOTE — ED Notes (Signed)
Pt on 3L [place] 

## 2014-11-03 NOTE — ED Notes (Addendum)
Pt comes in via EMS due to overdose on oxycodone and xanax.  Upon arrival to the scene, the husband was caught flushing the rest of the medications down the toilet.  Unknown amount of ingestion.  Rx was filled on 10/31/14 for oxycodone 160 tablets and 120 tablets of xanax. Patients BP was 150 palpable at the scene but was unattainable in the EMS truck.  Patient was agonal upon arrival and was bagged.  Given 2mg  narcan intranasal and then another 2mg  IV around 11:20.  Patient A&Ox4 and denies substance abuse or drug abuse.  CBG 199, end tidal in the upper 30, 95% 3L nasal cannula.  Hx back pain, anxiety, and drug abuse according to bystander.

## 2014-11-03 NOTE — ED Notes (Addendum)
Pt became agitated and states "I want to go home, Ill go to [facility]" pt ripped out IV, pt sitting on the edge of the bed rambling on about children at home and walking, daughter at bedside, MD at bedside

## 2014-11-03 NOTE — ED Notes (Addendum)
Pharmacy called for narcan gtts  BEHAVIORAL HEALTH ROUNDING Patient sleeping: No. Patient alert and oriented: no Behavior appropriate: Yes.   Nutrition and fluids offered: Yes  Toileting and hygiene offered: Yes  Sitter present: yes Law enforcement present: Yes   ENVIRONMENTAL ASSESSMENT Potentially harmful objects out of patient reach: Yes.   Personal belongings secured: Yes.   Patient dressed in hospital provided attire only: Yes.   Plastic bags out of patient reach: Yes.   Patient care equipment (cords, cables, call bells, lines, and drains) shortened, removed, or accounted for: Yes.   Equipment and supplies removed from bottom of stretcher: Yes.   Potentially toxic materials out of patient reach: Yes.   Sharps container removed or out of patient reach: Yes.

## 2014-11-03 NOTE — H&P (Addendum)
[facility]   PATIENT NAME: Jeanne Leach    MR#:  [id number]  DATE OF BIRTH:  28-Nov-1965  DATE OF ADMISSION:  11/03/2014  PRIMARY CARE PHYSICIAN: Altamese Polk, MD   REQUESTING/REFERRING PHYSICIAN:   CHIEF COMPLAINT:   Chief Complaint  Patient presents with  . Drug Overdose    oxycodone and xanax    HISTORY OF PRESENT ILLNESS: Jeanne Leach  is a 49 y.o. female with a known history of  chronic pain and drug abuse who is brought to the emergency room with decrease in responsiveness and somnolent. Patient took a large amount of unspecified quantity of narcotics at home. She was breathing actually when EMS arrived and was bagged until she she awoke somewhat with Narcan 2. Patient continued to have decrease in responsiveness therefore was started on a Narcan drip. She is currently still lethargic but opens her eyes from time to time. Her oxygen saturation is greater than 90s.  PAST MEDICAL HISTORY:   Past Medical History  Diagnosis Date  . Hypertension   . Degenerative disc disease, cervical   . Degenerative disc disease, lumbar   . Bipolar 1 disorder   . Degenerative disc disease, cervical   . Anxiety   . Chronic back pain     PAST SURGICAL HISTORY:  Past Surgical History  Procedure Laterality Date  . Cesarean section    . Lipoma excision      SOCIAL HISTORY:  History  Substance Use Topics  . Smoking status: Current Every Day Smoker -- 1.00 packs/day  . Smokeless tobacco: Never Used  . Alcohol Use: No    FAMILY HISTORY:  Family History  Problem Relation Age of Onset  . Hypertension      DRUG ALLERGIES: No Known Allergies  REVIEW OF SYSTEMS:  Unobtainable due to patient being very lethargic    CMEDICATIONS AT HOME:  Prior to Admission medications   Medication Sig Start Date End Date Taking? Authorizing Provider  alprazolam Prudy Feeler) 2 MG tablet Take 0.5 tablets (1 mg total) by mouth 3 (three) times daily as  needed for sleep or anxiety. Patient taking differently: Take 2 mg by mouth every 6 (six) hours as needed for anxiety.  08/09/14  Yes Maryann Mikhail, DO  cloNIDine (CATAPRES) 0.1 MG tablet Take 1 tablet (0.1 mg total) by mouth 3 (three) times daily as needed (withdrawl symptoms). 10/13/14  Yes Maurilio Lovely, MD  Oxycodone HCl 20 MG TABS Take 1 tablet by mouth every 4 (four) hours as needed (for pain).   Yes Historical Provider, MD  valsartan-hydrochlorothiazide (DIOVAN-HCT) 320-25 MG per tablet Take 1 tablet by mouth daily.   Yes Historical Provider, MD  meclizine (ANTIVERT) 50 MG tablet Take 1 tablet (50 mg total) by mouth 3 (three) times daily as needed for dizziness or nausea. Patient not taking: Reported on 10/13/2014 08/17/14   Toy Cookey, MD  nicotine (NICODERM CQ - DOSED IN MG/24 HOURS) 14 mg/24hr patch Place 1 patch (14 mg total) onto the skin daily. Patient not taking: Reported on 08/17/2014 08/09/14   Maryann Mikhail, DO  valsartan (DIOVAN) 160 MG tablet Take 1 tablet (160 mg total) by mouth daily. Patient not taking: Reported on 10/13/2014 08/17/14   Toy Cookey, MD      PHYSICAL EXAMINATION:   VITAL SIGNS: Blood pressure 88/43, pulse 92, temperature 100.7 F (38.2 C), temperature source Oral, resp. rate 9, height 5\' 7"  (1.702 m), weight 86.183 kg (190 lb), last menstrual period 11/03/2014,  SpO2 99 %.  GENERAL:  49 y.o.-year-old patient lying in the bed critically ill  EYES: Pinpoint pupils, reactive to light and accommodation. No scleral icterus. Extraocular muscles intact.  HEENT: Head atraumatic, normocephalic. Oropharynx and nasopharynx clear.  NECK:  Supple, no jugular venous distention. No thyroid enlargement, no tenderness.  LUNGS: Normal breath sounds bilaterally, no wheezing, rales,rhonchi or crepitation. No use of accessory muscles of respiration.  CARDIOVASCULAR: S1, S2 normal. No murmurs, rubs, or gallops.  ABDOMEN: Soft, nontender, nondistended. Bowel sounds present.  No organomegaly or mass.  EXTREMITIES: No pedal edema, cyanosis, or clubbing.  NEUROLOGIC: Moving all extremities and able to do complete neuro exam due to patient being lethargic  PSYCHIATRIC: Lethargic  SKIN: No obvious rash, lesion, or ulcer.   LABORATORY PANEL:   CBC  Recent Labs Lab 11/03/14 1220  WBC 20.0*  HGB 12.4  HCT 37.9  PLT 303  MCV 92.4  MCH 30.3  MCHC 32.8  RDW 14.4   ------------------------------------------------------------------------------------------------------------------  Chemistries   Recent Labs Lab 11/03/14 1220  NA 137  K 4.9  CL 101  CO2 27  GLUCOSE 114*  BUN 19  CREATININE 2.23*  CALCIUM 9.3  AST 27  ALT 12*  ALKPHOS 55  BILITOT 0.4   ------------------------------------------------------------------------------------------------------------------ estimated creatinine clearance is 35.2 mL/min (by C-G formula based on Cr of 2.23). ------------------------------------------------------------------------------------------------------------------ No results for input(s): TSH, T4TOTAL, T3FREE, THYROIDAB in the last 72 hours.  Invalid input(s): FREET3   Coagulation profile No results for input(s): INR, PROTIME in the last 168 hours. ------------------------------------------------------------------------------------------------------------------- No results for input(s): DDIMER in the last 72 hours. -------------------------------------------------------------------------------------------------------------------  Cardiac Enzymes No results for input(s): CKMB, TROPONINI, MYOGLOBIN in the last 168 hours.  Invalid input(s): CK ------------------------------------------------------------------------------------------------------------------ Invalid input(s): POCBNP  ---------------------------------------------------------------------------------------------------------------  Urinalysis    Component Value Date/Time    COLORURINE YELLOW* 11/03/2014 1403   APPEARANCEUR CLEAR* 11/03/2014 1403   LABSPEC 1.010 11/03/2014 1403   PHURINE 5.0 11/03/2014 1403   GLUCOSEU NEGATIVE 11/03/2014 1403   HGBUR 1+* 11/03/2014 1403   BILIRUBINUR NEGATIVE 11/03/2014 1403   KETONESUR NEGATIVE 11/03/2014 1403   PROTEINUR NEGATIVE 11/03/2014 1403   UROBILINOGEN 0.2 08/17/2014 1224   NITRITE NEGATIVE 11/03/2014 1403   LEUKOCYTESUR NEGATIVE 11/03/2014 1403     RADIOLOGY: Dg Chest Portable 1 View  11/03/2014   CLINICAL DATA:  Hypertension. Anxiety. Back pain. Fever and leukocytosis.  EXAM: PORTABLE CHEST - 1 VIEW  COMPARISON:  None.  FINDINGS: Patient has taken a poor inspiration. Artifact overlies the chest. Heart size is normal. Left lungs clear. There may be mild patchy density at the right base. No dense consolidation or lobar collapse. No effusion. Bony structures are unremarkable.  IMPRESSION: Question mild patchy density at the right base. Consider two-view chest radiography if concern persists.   Electronically Signed   By: Paulina Fusi M.D.   On: 11/03/2014 13:22    EKG: Orders placed or performed during the hospital encounter of 08/17/14  . ED EKG  . ED EKG  . EKG 12-Lead  . EKG 12-Lead  . EKG    IMPRESSION AND PLAN: Patient is a 49 year old female with history of drug abuse presents with narcotic overdose  1. Narcotic overdose: She will be continued on a Narcan drip and monitor closely in the ICU, for her opiate dependence and overdose a psychiatry consult will be obtained  2. Hypertension: Patient is currently hypotensive we'll hold her blood pressure medications and give her IV fluids  3. Bipolar disorder: In light of her  mental status being the weight is I'll hold her alprazolam  4. Chronic pain: We'll hold her oxycodone  5. Aspiration pneumonia: At this time will treat with Unasyn     All the records are reviewed and case discussed with ED provider. Management plans discussed with the patient,  family and they are in agreement.  CODE STATUS:full code    TOTAL TIME TAKING CARE OF THIS PATIENT: 55 minutes.    Auburn BilberryPATEL, Ryker Sudbury M.D on 11/03/2014 at 4:43 PM  Between 7am to 6pm - Pager - [phone]  After 6pm go to [email or web address] - password [email or web address] [facility]  [facility]  Office  [phone]  CC: Primary care physician; Altamese CarolinaMARTIN,TANYA D, MD

## 2014-11-03 NOTE — Progress Notes (Signed)
Alert to self and place. Drowsy but waking up and talking more since admission. Husband visited and patient and husband argued and husband left ICU. Patient follows commands. Blood pressure stable but on low side, continue to monitor. NSR per cardiac monitor. Denies pain. Sitter at bedside.  Jeanne Leach B

## 2014-11-03 NOTE — Progress Notes (Signed)
CSW attempted to assess but patient is extremely drowsy and unable to assess at this time.  Dr. York CeriseForbach confirms patient is sedated due to overdose of medications.  TTS will make another attempt to assess later.     Maryelizabeth Rowanressa Malika Demario, MSW, LCSW, LCAS Clinical Social Worker [phone]

## 2014-11-03 NOTE — ED Notes (Signed)
Per Dr. York CeriseForbach, narcan gtt to be delayed and not started at this time

## 2014-11-03 NOTE — ED Notes (Signed)
Pt lethargic, arousable to verbal and painful stimuli and then falls back asleep

## 2014-11-04 ENCOUNTER — Encounter: Payer: Self-pay | Admitting: Psychiatry

## 2014-11-04 DIAGNOSIS — F1194 Opioid use, unspecified with opioid-induced mood disorder: Secondary | ICD-10-CM

## 2014-11-04 DIAGNOSIS — F1120 Opioid dependence, uncomplicated: Secondary | ICD-10-CM | POA: Diagnosis present

## 2014-11-04 DIAGNOSIS — F410 Panic disorder [episodic paroxysmal anxiety] without agoraphobia: Secondary | ICD-10-CM | POA: Diagnosis present

## 2014-11-04 DIAGNOSIS — F1320 Sedative, hypnotic or anxiolytic dependence, uncomplicated: Secondary | ICD-10-CM | POA: Diagnosis present

## 2014-11-04 DIAGNOSIS — T402X1A Poisoning by other opioids, accidental (unintentional), initial encounter: Secondary | ICD-10-CM | POA: Diagnosis present

## 2014-11-04 LAB — CBC
HCT: 31.5 % — ABNORMAL LOW (ref 35.0–47.0)
Hemoglobin: 10.3 g/dL — ABNORMAL LOW (ref 12.0–16.0)
MCH: 30.5 pg (ref 26.0–34.0)
MCHC: 32.8 g/dL (ref 32.0–36.0)
MCV: 93 fL (ref 80.0–100.0)
Platelets: 222 10*3/uL (ref 150–440)
RBC: 3.39 MIL/uL — ABNORMAL LOW (ref 3.80–5.20)
RDW: 14.3 % (ref 11.5–14.5)
WBC: 6 10*3/uL (ref 3.6–11.0)

## 2014-11-04 LAB — BASIC METABOLIC PANEL
Anion gap: 3 — ABNORMAL LOW (ref 5–15)
BUN: 13 mg/dL (ref 6–20)
CHLORIDE: 110 mmol/L (ref 101–111)
CO2: 28 mmol/L (ref 22–32)
Calcium: 8.2 mg/dL — ABNORMAL LOW (ref 8.9–10.3)
Creatinine, Ser: 0.94 mg/dL (ref 0.44–1.00)
GFR calc Af Amer: >60 mL/min (ref >60)
GLUCOSE: 127 mg/dL — AB (ref 65–99)
POTASSIUM: 4.2 mmol/L (ref 3.5–5.1)
Sodium: 141 mmol/L (ref 135–145)

## 2014-11-04 MED ORDER — OXYCODONE HCL 5 MG PO TABS
5.0000 mg | ORAL_TABLET | ORAL | Status: DC | PRN
  Administered 2014-11-04 (×3): 5 mg via ORAL
  Filled 2014-11-04 (×3): qty 1

## 2014-11-04 MED ORDER — ALPRAZOLAM 1 MG PO TABS
1.0000 mg | ORAL_TABLET | Freq: Three times a day (TID) | ORAL | Status: DC | PRN
  Administered 2014-11-04: 1 mg via ORAL
  Filled 2014-11-04: qty 1

## 2014-11-04 NOTE — Progress Notes (Signed)
Notified Dr Clent RidgesWalsh of pt request to ask about her pain and anxiety medications. Dr acknowledged, stated she would review medications. No new orders

## 2014-11-04 NOTE — Consult Note (Signed)
[facility] Consult   Reason for Consult: To evaluate the patient after a suicide attempt. Referring Physician:  Dr. Marikay Alar Patient Identification: Jeanne Leach MRN:  [id number] Principal Diagnosis: Opiate overdose Diagnosis:   Patient Active Problem List   Diagnosis Date Noted  . Overdose [T50.901A] 11/03/2014  . Sepsis due to urinary tract infection [A41.9, N39.0] 08/08/2014  . Septic shock [A41.9, R65.21] 08/08/2014  . Diabetes mellitus without complication [A19.3] 79/06/4095  . Fall [W19.XXXA] 08/07/2014  . Hypertension [I10]   . Degenerative disc disease, cervical [M50.30]   . Bipolar 1 disorder [F31.9]   . AKI (acute kidney injury) [N17.9]     Total Time spent with patient: 1 hour  Subjective:   Jeanne Leach is a 49 y.o. female patient admitted with overdose on all periods.Marland Kitchen  HPI:    Identifying data. Jeanne Mooreis a 49 year old female with history of anxiety, chronic pain, opiate and benzodiazepine dependence.  Chief complaint. "It wasn't on purpose."  History of present illness. Jeanne Leach has a long history of anxiety with frequent panic attacks that hurt her chest. She has been prescribed Xanax 2 mg twice daily for years. She reports good compliance with treatment and no misuse of benzodiazepine. She does not take any other medications to address excessive anxiety. In addition the patient has chronic pain. She has been in the Of Dr. Hassell Done in [place] who prescribes narcotic painkillers. The patient reports good compliance with treatment and no misuse of opiates. However on the day of admission she apparently took more than usual of her pain medication. She became sedated and her husband brought her to the emergency room. She suffers from back problems and has been on disability for 10 years now. She also has new problem with her right knee that was broken a year ago and has been swollen, dysfunctional, and painful. I spoke with the patient as well as  with her daughter who confirms that the patient never runs out of medications and most of the time takes as prescribed. The daughter points out however that at times her mother forgets that she already took a dose and takes another one. The patient as well as her daughter adamantly denied that the patient has been depressed lately. None of them believes that this was intentional overdose rather the patient took more medications in error. The patient denies any symptoms of depression, symptoms suggestive of bipolar mania, or any psychotic symptoms. She denies alcohol or illicit substance use. Her anxiety has been well controlled with her regular dose of Xanax. The patient is aware that a combination of benzodiazepine and narcotic painkillers is dangerous. The patient reports that she never takes Xanax and narcotics to get her and tries to space them out.   Past psychiatric history. Long history of anxiety she's been on benzos for over 15 years. She has an old diagnosis of bipolar disorder but does not believe that this is her true problem. She was admitted to [facility] psychiatry in 2015 in an attempt to detox from opiates. This was futile the patient went back on narcotics as she could not tolerate pain. She denies ever attempting suicide.  Family psychiatric history. Nonreported. There were no completed suicides in the family.  Social history. She is married. She lives with her husband and the 46 year old son. She is disabled from back problems. The patient has multiple grandchildren and laughs her family, is a devoted mother and grandmother and is adamant that she would never try  to hurt herself or anybody else. She points out that the family was supposed to go on family vacation just now. This was interrupted by the hospital admission. The patient is looking forward to go on vacation as soon as she is discharged.  Past Medical History:  Past Medical History  Diagnosis Date  .  Hypertension   . Degenerative disc disease, cervical   . Degenerative disc disease, lumbar   . Bipolar 1 disorder   . Degenerative disc disease, cervical   . Anxiety   . Chronic back pain     Past Surgical History  Procedure Laterality Date  . Cesarean section    . Lipoma excision     Family History:  Family History  Problem Relation Age of Onset  . Hypertension     Social History:  History  Alcohol Use No     History  Drug Use  . Yes    Comment: narcotics    History   Social History  . Marital Status: Married    Spouse Name: N/A  . Number of Children: N/A  . Years of Education: N/A   Social History Main Topics  . Smoking status: Current Every Day Smoker -- 1.00 packs/day  . Smokeless tobacco: Never Used  . Alcohol Use: No  . Drug Use: Yes     Comment: narcotics  . Sexual Activity: Not on file   Other Topics Concern  . None   Social History Narrative   Additional Social History:                          Allergies:  No Known Allergies  Labs:  Results for orders placed or performed during the hospital encounter of 11/03/14 (from the past 48 hour(s))  Acetaminophen level     Status: Abnormal   Collection Time: 11/03/14 12:20 PM  Result Value Ref Range   Acetaminophen (Tylenol), Serum <10 (L) 10 - 30 ug/mL    Comment:        THERAPEUTIC CONCENTRATIONS VARY SIGNIFICANTLY. A RANGE OF 10-30 ug/mL MAY BE AN EFFECTIVE CONCENTRATION FOR MANY PATIENTS. HOWEVER, SOME ARE BEST TREATED AT CONCENTRATIONS OUTSIDE THIS RANGE. ACETAMINOPHEN CONCENTRATIONS >150 ug/mL AT 4 HOURS AFTER INGESTION AND >50 ug/mL AT 12 HOURS AFTER INGESTION ARE OFTEN ASSOCIATED WITH TOXIC REACTIONS.   CBC     Status: Abnormal   Collection Time: 11/03/14 12:20 PM  Result Value Ref Range   WBC 20.0 (H) 3.6 - 11.0 K/uL   RBC 4.11 3.80 - 5.20 MIL/uL   Hemoglobin 12.4 12.0 - 16.0 g/dL   HCT 37.9 35.0 - 47.0 %   MCV 92.4 80.0 - 100.0 fL   MCH 30.3 26.0 - 34.0 pg   MCHC  32.8 32.0 - 36.0 g/dL   RDW 14.4 11.5 - 14.5 %   Platelets 303 150 - 440 K/uL  Comprehensive metabolic panel     Status: Abnormal   Collection Time: 11/03/14 12:20 PM  Result Value Ref Range   Sodium 137 135 - 145 mmol/L   Potassium 4.9 3.5 - 5.1 mmol/L   Chloride 101 101 - 111 mmol/L   CO2 27 22 - 32 mmol/L   Glucose, Bld 114 (H) 65 - 99 mg/dL   BUN 19 6 - 20 mg/dL   Creatinine, Ser 2.23 (H) 0.44 - 1.00 mg/dL   Calcium 9.3 8.9 - 10.3 mg/dL   Total Protein 8.0 6.5 - 8.1 g/dL   Albumin 4.1 3.5 -  5.0 g/dL   AST 27 15 - 41 U/L   ALT 12 (L) 14 - 54 U/L   Alkaline Phosphatase 55 38 - 126 U/L   Total Bilirubin 0.4 0.3 - 1.2 mg/dL   GFR calc non Af Amer 25 (L) >60 mL/min   GFR calc Af Amer 29 (L) >60 mL/min    Comment: (NOTE) The eGFR has been calculated using the CKD EPI equation. This calculation has not been validated in all clinical situations. eGFR's persistently <60 mL/min signify possible Chronic Kidney Disease.    Anion gap 9 5 - 15  Ethanol (ETOH)     Status: None   Collection Time: 11/03/14 12:20 PM  Result Value Ref Range   Alcohol, Ethyl (B) <5 <5 mg/dL    Comment:        LOWEST DETECTABLE LIMIT FOR SERUM ALCOHOL IS 5 mg/dL FOR MEDICAL PURPOSES ONLY   Salicylate level     Status: None   Collection Time: 11/03/14 12:20 PM  Result Value Ref Range   Salicylate Lvl <0.3 2.8 - 30.0 mg/dL  Urine Drug Screen, Qualitative ([facility] only)     Status: Abnormal   Collection Time: 11/03/14  2:03 PM  Result Value Ref Range   Tricyclic, Ur Screen NONE DETECTED NONE DETECTED   Amphetamines, Ur Screen NONE DETECTED NONE DETECTED   MDMA (Ecstasy)Ur Screen NONE DETECTED NONE DETECTED   Cocaine Metabolite,Ur [place] NONE DETECTED NONE DETECTED   Opiate, Ur Screen POSITIVE (A) NONE DETECTED   Phencyclidine (PCP) Ur S NONE DETECTED NONE DETECTED   Cannabinoid 50 Ng, Ur [place] POSITIVE (A) NONE DETECTED   Barbiturates, Ur Screen NONE DETECTED NONE DETECTED   Benzodiazepine, Ur Scrn POSITIVE  (A) NONE DETECTED   Methadone Scn, Ur NONE DETECTED NONE DETECTED    Comment: (NOTE) 546  Tricyclics, urine               Cutoff 1000 ng/mL 200  Amphetamines, urine             Cutoff 1000 ng/mL 300  MDMA (Ecstasy), urine           Cutoff 500 ng/mL 400  Cocaine Metabolite, urine       Cutoff 300 ng/mL 500  Opiate, urine                   Cutoff 300 ng/mL 600  Phencyclidine (PCP), urine      Cutoff 25 ng/mL 700  Cannabinoid, urine              Cutoff 50 ng/mL 800  Barbiturates, urine             Cutoff 200 ng/mL 900  Benzodiazepine, urine           Cutoff 200 ng/mL 1000 Methadone, urine                Cutoff 300 ng/mL 1100 1200 The urine drug screen provides only a preliminary, unconfirmed 1300 analytical test result and should not be used for non-medical 1400 purposes. Clinical consideration and professional judgment should 1500 be applied to any positive drug screen result due to possible 1600 interfering substances. A more specific alternate chemical method 1700 must be used in order to obtain a confirmed analytical result.  1800 Gas chromato graphy / mass spectrometry (GC/MS) is the preferred 1900 confirmatory method.   Urinalysis complete, with microscopic [facility] only)     Status: Abnormal   Collection Time: 11/03/14  2:03 PM  Result Value  Ref Range   Color, Urine YELLOW (A) YELLOW   APPearance CLEAR (A) CLEAR   Glucose, UA NEGATIVE NEGATIVE mg/dL   Bilirubin Urine NEGATIVE NEGATIVE   Ketones, ur NEGATIVE NEGATIVE mg/dL   Specific Gravity, Urine 1.010 1.005 - 1.030   Hgb urine dipstick 1+ (A) NEGATIVE   pH 5.0 5.0 - 8.0   Protein, ur NEGATIVE NEGATIVE mg/dL   Nitrite NEGATIVE NEGATIVE   Leukocytes, UA NEGATIVE NEGATIVE   RBC / HPF 0-5 0 - 5 RBC/hpf   WBC, UA 0-5 0 - 5 WBC/hpf   Bacteria, UA NONE SEEN NONE SEEN   Squamous Epithelial / LPF 0-5 (A) NONE SEEN   Mucous PRESENT    Hyaline Casts, UA PRESENT   Urine culture     Status: None (Preliminary result)   Collection  Time: 11/03/14  2:03 PM  Result Value Ref Range   Specimen Description URINE, RANDOM    Special Requests Normal    Culture NO GROWTH <24 HRS    Report Status PENDING   MRSA PCR Screening     Status: None   Collection Time: 11/03/14  6:13 PM  Result Value Ref Range   MRSA by PCR NEGATIVE NEGATIVE    Comment:        The GeneXpert MRSA Assay (FDA approved for NASAL specimens only), is one component of a comprehensive MRSA colonization surveillance program. It is not intended to diagnose MRSA infection nor to guide or monitor treatment for MRSA infections.   Basic metabolic panel     Status: Abnormal   Collection Time: 11/04/14  6:11 AM  Result Value Ref Range   Sodium 141 135 - 145 mmol/L   Potassium 4.2 3.5 - 5.1 mmol/L   Chloride 110 101 - 111 mmol/L   CO2 28 22 - 32 mmol/L   Glucose, Bld 127 (H) 65 - 99 mg/dL   BUN 13 6 - 20 mg/dL   Creatinine, Ser 0.94 0.44 - 1.00 mg/dL   Calcium 8.2 (L) 8.9 - 10.3 mg/dL   GFR calc non Af Amer >60 >60 mL/min   GFR calc Af Amer >60 >60 mL/min    Comment: (NOTE) The eGFR has been calculated using the CKD EPI equation. This calculation has not been validated in all clinical situations. eGFR's persistently <60 mL/min signify possible Chronic Kidney Disease.    Anion gap 3 (L) 5 - 15  CBC     Status: Abnormal   Collection Time: 11/04/14  7:55 AM  Result Value Ref Range   WBC 6.0 3.6 - 11.0 K/uL   RBC 3.39 (L) 3.80 - 5.20 MIL/uL   Hemoglobin 10.3 (L) 12.0 - 16.0 g/dL   HCT 31.5 (L) 35.0 - 47.0 %   MCV 93.0 80.0 - 100.0 fL   MCH 30.5 26.0 - 34.0 pg   MCHC 32.8 32.0 - 36.0 g/dL   RDW 14.3 11.5 - 14.5 %   Platelets 222 150 - 440 K/uL    Vitals: Blood pressure 141/94, pulse 89, temperature 98.2 F (36.8 C), temperature source Oral, resp. rate 19, height '5\' 7"'  (1.702 m), weight 86.183 kg (190 lb), last menstrual period 11/03/2014, SpO2 100 %.  Risk to Self: Is patient at risk for suicide?: No Risk to Others:   Prior Inpatient  Therapy:   Prior Outpatient Therapy:    Current Facility-Administered Medications  Medication Dose Route Frequency Provider Last Rate Last Dose  . 0.9 %  sodium chloride infusion   Intravenous Continuous Dustin Flock, MD  100 mL/hr at 11/04/14 1028    . acetaminophen (TYLENOL) tablet 650 mg  650 mg Oral Q6H PRN Dustin Flock, MD   650 mg at 11/03/14 2313   Or  . acetaminophen (TYLENOL) suppository 650 mg  650 mg Rectal Q6H PRN Dustin Flock, MD      . Ampicillin-Sulbactam (UNASYN) 3 g in sodium chloride 0.9 % 100 mL IVPB  3 g Intravenous Q8H Dustin Flock, MD   3 g at 11/04/14 1024  . enoxaparin (LOVENOX) injection 40 mg  40 mg Subcutaneous Q24H Dustin Flock, MD   40 mg at 11/03/14 1828  . meclizine (ANTIVERT) tablet 50 mg  50 mg Oral TID PRN Dustin Flock, MD      . nicotine (NICODERM CQ - dosed in mg/24 hours) patch 14 mg  14 mg Transdermal Daily Dustin Flock, MD   14 mg at 11/04/14 1029  . ondansetron (ZOFRAN) tablet 4 mg  4 mg Oral Q6H PRN Dustin Flock, MD       Or  . ondansetron (ZOFRAN) injection 4 mg  4 mg Intravenous Q6H PRN Dustin Flock, MD      . pneumococcal 23 valent vaccine (PNU-IMMUNE) injection 0.5 mL  0.5 mL Intramuscular Tomorrow-1000 Dustin Flock, MD        Musculoskeletal: Strength & Muscle Tone: within normal limits Gait & Station: normal Patient leans: N/A  Psychiatric Specialty Exam: Physical Exam  Nursing note and vitals reviewed.   Review of Systems  All other systems reviewed and are negative.   Blood pressure 141/94, pulse 89, temperature 98.2 F (36.8 C), temperature source Oral, resp. rate 19, height '5\' 7"'  (1.702 m), weight 86.183 kg (190 lb), last menstrual period 11/03/2014, SpO2 100 %.Body mass index is 29.75 kg/(m^2).  General Appearance: Casual  Eye Contact::  Good  Speech:  Normal Rate  Volume:  Normal  Mood:  Euthymic  Affect:  Appropriate  Thought Process:  Goal Directed  Orientation:  Full (Time, Place, and Person)  Thought  Content:  WDL  Suicidal Thoughts:  No  Homicidal Thoughts:  No  Memory:  Immediate;   Fair Recent;   Fair Remote;   Fair  Judgement:  Fair  Insight:  Fair  Psychomotor Activity:  Normal  Concentration:  Fair  Recall:  [employer] [place]  Language: Fair  Akathisia:  No  Handed:  Right  AIMS (if indicated):     Assets:  Communication Skills Desire for Improvement Financial Resources/Insurance Housing Intimacy Resilience Social Support  ADL's:  Intact  Cognition: WNL  Sleep:      Medical Decision Making: Established Problem, Stable/Improving (1), Review of Psycho-Social Stressors (1), Review or order clinical lab tests (1), Review of Medication Regimen & Side Effects (2) and Review of New Medication or Change in Dosage (2)  Treatment Plan Summary: 1. The patient is no longer suicidal or homicidal. The patient does not meet criteria for psychiatric admission. I would discontinue involuntary commitment. Please discharge is appropriate.   2. The patient no longer needs sitter.  3. Anxiety. We discussed at length the dangers of combining benzodiazepine and opiates. The patient resists any medication changes. Specifically she is not interested in taking an untied the present as they make her feel "weird". She intends to continue with Dr. Hassell Done who prescribes both benzodiazepines and opiates.   4. Chronic pain. The patient is not interested in all. Detox. She wants to continue with Dr. Hassell Done following discharge.  5. Spoke with the daughter who will  dispense medications in the future to avoid any possible mistakes. The patient is in agreement.   Plan:  No evidence of imminent risk to self or others at present.   Patient does not meet criteria for psychiatric inpatient admission. Supportive therapy provided about ongoing stressors. Discussed crisis plan, support from social network, calling 911, coming to the Emergency Department, and calling Suicide  Hotline.  Disposition: The patient will return to home with family when medically stable. Please discharge as appropriate.  Seiji Wiswell 11/04/2014 1:36 PM

## 2014-11-04 NOTE — Progress Notes (Signed)
A&Ox4. Denies pain. VSS. Afebrile. Tolerating diet. Voiding in BSC. Mother visited this morning. Dr. Jennet MaduroPucilowska saw patient and gave order to RN to Discontinue IVC order and sitter order. Patient [facility] to room 207 at [facility]. Alert with no distress noted when leaving ICU.  Jeanne Leach B

## 2014-11-04 NOTE — Care Management Note (Signed)
Case Management Note  Patient Details  Name: Jeanne Leach MRN: [id number] Date of Birth: 04-04-1966  Subjective/Objective:  Admitted with narcotic overdose, oxycodone and xanax. Psych consult pending. PNA noted.  Met with patient at bedside. She reports the overdose was unintentional. She reports she woke up as usual and took her normal dose of medications. She was found to unresponsive, EMS called. Patient lives at home with her spouse and 47 y.o. Son. She is on disability due to back issues.  Patient is active with Dr.Martin, PCP and is seen at least monthly. She denies issues obtaining medications, copays or transportation. At discharge patient states she plans to go to stay with her mother who was at bedside during assessment.                Action/Plan:   Expected Discharge Date:                  Expected Discharge Plan:  Home/SelfCare  In-House Referral:     Discharge planning Services     Post Acute Care Choice:    Choice offered to:     DME Arranged:    DME Agency:     HH Arranged:    HH Agency:     Status of Service:  In process, will continue to follow  Medicare Important Message Given:    Date Medicare IM Given:    Medicare IM give by:    Date Additional Medicare IM Given:    Additional Medicare Important Message give by:     If discussed at [place] of Stay Meetings, dates discussed:    Additional Comments:  Jolly Mango, RN 11/04/2014, 10:49 AM

## 2014-11-04 NOTE — Progress Notes (Signed)
Report called to Casimiro NeedleMichael, RN on 2C.

## 2014-11-04 NOTE — Progress Notes (Addendum)
Orthostatic vitals ordered for 1900 not completed due to pt drowsiness. MD called at 1900(11/03/14) for clarification on narcan order with no parameters, order changed to stop narcan at 0330(11/04/14).  MD called again at 2000 due to pt drop in b/p, 500cc bolus order placed and given.  Pt b/p improved after bolus.  Dr.Diamond informed of drop in b/p yet again at 2300 and narcan stopped at 0000. Pt b/p improved after narcan drip stopped.  Sitter at bed side. Will continue to monitor b/p.  Nolon RodPamela Avonell Lenig, RN

## 2014-11-04 NOTE — Progress Notes (Signed)
[facility] [place] [facility]   PATIENT NAME: Jeanne Leach    MR#:  [id number]  DATE OF BIRTH:  July 21, 1965  SUBJECTIVE:  CHIEF COMPLAINT:   Chief Complaint  Patient presents with  . Drug Overdose    oxycodone and xanax   Anxious for discharge. States that she feels fine. Blood pressure on my examination is 96/64. She denies headache shortness of breath or dizziness. States that overdose was not intentional.  REVIEW OF SYSTEMS:   Review of Systems  Constitutional: Negative for fever.  Respiratory: Negative for shortness of breath.   Cardiovascular: Negative for chest pain and palpitations.  Gastrointestinal: Negative for nausea, vomiting and abdominal pain.  Genitourinary: Negative for dysuria.    DRUG ALLERGIES:  No Known Allergies  VITALS:  Blood pressure 141/78, pulse 90, temperature 98.4 F (36.9 C), temperature source Oral, resp. rate 18, height  (1.702 m), weight 86.183 kg (190 lb), last menstrual period 11/03/2014, SpO2 100 %.  PHYSICAL EXAMINATION:  GENERAL:  49 y.o.-year-old patient lying in the bed with no acute distress.  EYES: Pupils equal, round, reactive to light and accommodation. No scleral icterus. Extraocular muscles intact.  HEENT: Head atraumatic, normocephalic. Oropharynx and nasopharynx clear.  NECK:  Supple, no jugular venous distention. No thyroid enlargement, no tenderness.  LUNGS: Normal breath sounds bilaterally, no wheezing, rales, rhonchi or crepitation. No use of accessory muscles of respiration.  CARDIOVASCULAR: S1, S2 normal. No murmurs, rubs, or gallops.  ABDOMEN: Soft, nontender, nondistended. Bowel sounds present. No organomegaly or mass.  EXTREMITIES: No pedal edema, cyanosis, or clubbing.  NEUROLOGIC: Cranial nerves II through XII are intact. Muscle strength 5/5 in all extremities. Sensation intact. Gait not checked.  PSYCHIATRIC: The patient is alert and oriented x 3. Anxious SKIN: No obvious rash,  lesion, or ulcer.    LABORATORY PANEL:   CBC  Recent Labs Lab 11/04/14 0755  WBC 6.0  HGB 10.3*  HCT 31.5*  PLT 222   ------------------------------------------------------------------------------------------------------------------  Chemistries   Recent Labs Lab 11/03/14 1220 11/04/14 0611  NA 137 141  K 4.9 4.2  CL 101 110  CO2 27 28  GLUCOSE 114* 127*  BUN 19 13  CREATININE 2.23* 0.94  CALCIUM 9.3 8.2*  AST 27  --   ALT 12*  --   ALKPHOS 55  --   BILITOT 0.4  --    ------------------------------------------------------------------------------------------------------------------  Cardiac Enzymes No results for input(s): TROPONINI in the last 168 hours. ------------------------------------------------------------------------------------------------------------------  RADIOLOGY:  Dg Chest Portable 1 View  11/03/2014   CLINICAL DATA:  Hypertension. Anxiety. Back pain. Fever and leukocytosis.  EXAM: PORTABLE CHEST - 1 VIEW  COMPARISON:  None.  FINDINGS: Patient has taken a poor inspiration. Artifact overlies the chest. Heart size is normal. Left lungs clear. There may be mild patchy density at the right base. No dense consolidation or lobar collapse. No effusion. Bony structures are unremarkable.  IMPRESSION: Question mild patchy density at the right base. Consider two-view chest radiography if concern persists.   Electronically Signed   By: Paulina Fusi M.D.   On: 11/03/2014 13:22    EKG:   Orders placed or performed during the hospital encounter of 11/03/14  . EKG 12-Lead  . EKG 12-Lead    ASSESSMENT AND PLAN:   #1 narcotic and benzodiazepine overdose: She was admitted initially to the ICU and placed on a Narcan drip. She has been off of the drip now for 8 hours. She is alert, oriented, no respiratory  distress or respiratory suppression. Psychiatry consultation pending, currently IVC. She is complaining of pain, will reintroduce pain medications cautiously at  much lower doses.  #2 hypotension: Holding antihypertensives. She has received several fluid boluses is now +3 L. Continue IV fluids. Hypotension likely due to overdose.   #3 acute renal failure: Likely due to hypotension. Improving with improved blood pressure and IV fluid  #4 aspiration pneumonia: Likely occurred in the setting of sedation due to overdose. No respiratory distress. Oxygen saturations are normal. Continue Unasyn. Cultures negative. Leukocytosis resolved. She does continue to have a low-grade fever.  #5 bipolar disorder: We'll restart her home Xanax. Psychiatry consultation pending.    All the records are reviewed and case discussed with Care Management/Social Workerr. Management plans discussed with the patient, family and they are in agreement.  CODE STATUS: Full  TOTAL TIME TAKING CARE OF THIS PATIENT: 35 minutes. Greater than 50% of time spent in care coordination and counseling. Spoke with the patient and her mother at the bedside today.  POSSIBLE D/C IN 1 DAYS, DEPENDING ON CLINICAL CONDITION.   Elby ShowersWALSH, Stepanie Graver M.D on 11/04/2014 at 2:46 PM  Between 7am to 6pm - Pager - [phone]  After 6pm go to [email or web address] - password [email or web address] [facility]  [facility]  Office  [phone]  CC: Primary care physician; Altamese CarolinaMARTIN,TANYA D, MD

## 2014-11-04 NOTE — Progress Notes (Signed)
Dr. Jennet MaduroPucilowska present and gave order to discontinue IVC and to discontinue sitter order.

## 2014-11-05 ENCOUNTER — Inpatient Hospital Stay: Payer: Medicare Other

## 2014-11-05 DIAGNOSIS — F1114 Opioid abuse with opioid-induced mood disorder: Secondary | ICD-10-CM | POA: Diagnosis not present

## 2014-11-05 LAB — CBC
HCT: 29.8 % — ABNORMAL LOW (ref 35.0–47.0)
Hemoglobin: 9.7 g/dL — ABNORMAL LOW (ref 12.0–16.0)
MCH: 30.2 pg (ref 26.0–34.0)
MCHC: 32.6 g/dL (ref 32.0–36.0)
MCV: 92.8 fL (ref 80.0–100.0)
PLATELETS: 225 10*3/uL (ref 150–440)
RBC: 3.21 MIL/uL — ABNORMAL LOW (ref 3.80–5.20)
RDW: 14.4 % (ref 11.5–14.5)
WBC: 9 10*3/uL (ref 3.6–11.0)

## 2014-11-05 LAB — BASIC METABOLIC PANEL
Anion gap: 6 (ref 5–15)
BUN: 11 mg/dL (ref 6–20)
CO2: 26 mmol/L (ref 22–32)
CREATININE: 1.06 mg/dL — AB (ref 0.44–1.00)
Calcium: 8.5 mg/dL — ABNORMAL LOW (ref 8.9–10.3)
Chloride: 109 mmol/L (ref 101–111)
Glucose, Bld: 107 mg/dL — ABNORMAL HIGH (ref 65–99)
Potassium: 4.4 mmol/L (ref 3.5–5.1)
Sodium: 141 mmol/L (ref 135–145)

## 2014-11-05 LAB — URINE CULTURE: SPECIAL REQUESTS: NORMAL

## 2014-11-05 LAB — BLOOD GAS, ARTERIAL
ALLENS TEST (PASS/FAIL): POSITIVE — AB
Acid-Base Excess: 2.3 mmol/L (ref 0.0–3.0)
Bicarbonate: 29.5 mEq/L — ABNORMAL HIGH (ref 21.0–28.0)
FIO2: 0.24 %
O2 SAT: 95.3 %
PCO2 ART: 56 mmHg — AB (ref 32.0–48.0)
PH ART: 7.33 — AB (ref 7.350–7.450)
Patient temperature: 37
pO2, Arterial: 83 mmHg (ref 83.0–108.0)

## 2014-11-05 MED ORDER — ALPRAZOLAM 0.25 MG PO TABS
0.2500 mg | ORAL_TABLET | Freq: Once | ORAL | Status: AC
  Administered 2014-11-05: 0.25 mg via ORAL
  Filled 2014-11-05: qty 1

## 2014-11-05 MED ORDER — MORPHINE SULFATE 2 MG/ML IJ SOLN
2.0000 mg | Freq: Once | INTRAMUSCULAR | Status: AC
  Administered 2014-11-05: 2 mg via INTRAVENOUS
  Filled 2014-11-05: qty 1

## 2014-11-05 MED ORDER — OXYCODONE HCL 5 MG PO TABS
5.0000 mg | ORAL_TABLET | ORAL | Status: DC | PRN
  Administered 2014-11-05 – 2014-11-06 (×3): 5 mg via ORAL
  Filled 2014-11-05 (×3): qty 1

## 2014-11-05 NOTE — Progress Notes (Signed)
Dr. Sheryle Hailiamond notified of pt having increasing confusion, tremors, temp 102.6 and husband is very concerned of how she is currently doing.  She continues to say things that don't make sense and her husband states this is not her.  MD stated that she is withdrawing and to give her 2 mg of morphine once. Tylenol will be given for fever. Will cont. To monitor.

## 2014-11-05 NOTE — Progress Notes (Signed)
Notified Dr Auburn BilberryShreyang Patel that pt was intending to leave AMA; told Dr that pt c/o pain and wanted to be restarted on pain meds, and that pt stated that "if they aren't going to give me anything for pain here, I'll go home and take my own"; Dr ordered oxycodone 5 mg q4 hrs prn

## 2014-11-05 NOTE — Care Management (Signed)
Important Message  Patient Details  Name: Jeanne HinesDeborah Leach MRN: [id number] Date of Birth: [birth year]   Medicare Important Message Given:  Yes-second notification given    Olegario MessierKathy A Allmond 11/05/2014, 10:28 AM

## 2014-11-05 NOTE — Progress Notes (Signed)
Pt refuses bed alarm at this time. Informed pt that when she gets too lethargic and unsteady on feet that she needs her bed alarm placed.

## 2014-11-05 NOTE — Progress Notes (Signed)
Told pt that Dr had reordered Oxycodone 5 mg q4 hrs PRN; Pt stated that she had decided to stay and not leave AMA

## 2014-11-05 NOTE — Progress Notes (Signed)
Dr. Anne HahnWillis notified of pt requesting her xanax to help her calm down and get some rest. I informed pt of the reason what brought her to the hospital in the beginning and she understood but she still requests for the xanax. Pt is more alert at this moment after getting oxy 5mg  an hr ago. MD ordered 0.25mg  of xanax once for now.  Will inform pt.

## 2014-11-05 NOTE — Progress Notes (Signed)
[facility] - [place] [facility]   PATIENT NAME: Jeanne HinesDeborah Leach    MR#:  [id number]  DATE OF BIRTH:  [birth year]  SUBJECTIVE:  CHIEF COMPLAINT:   Chief Complaint  Patient presents with  . Drug Overdose    oxycodone and xanax   Overnight patient still had low oxygen sat saturation had to be placed back on oxygen. Her temperature also was little high at 100.6. She continues to remain intermittently drowsy. Even though she has not received any sedating medications. Patient is more awake this morning  REVIEW OF SYSTEMS:   Review of Systems  Constitutional: Negative for fever.  Respiratory: Negative for shortness of breath.   Cardiovascular: Negative for chest pain and palpitations.  Gastrointestinal: Negative for nausea, vomiting and abdominal pain.  Genitourinary: Negative for dysuria.    DRUG ALLERGIES:  No Known Allergies  VITALS:  Blood pressure 104/42, pulse 100, temperature 98.5 F (36.9 C), temperature source Oral, resp. rate 17, height 5\' 7"  (1.702 m), weight 86.183 kg (190 lb), last menstrual period 11/03/2014, SpO2 95 %.  PHYSICAL EXAMINATION:  GENERAL:  49 y.o.-year-old patient lying in the bed with no acute distress.  EYES: Pupils equal, round, reactive to light and accommodation. No scleral icterus. Extraocular muscles intact.  HEENT: Head atraumatic, normocephalic. Oropharynx and nasopharynx clear.  NECK:  Supple, no jugular venous distention. No thyroid enlargement, no tenderness.  LUNGS: Normal breath sounds bilaterally, no wheezing, rales, rhonchi or crepitation. No use of accessory muscles of respiration.  CARDIOVASCULAR: S1, S2 normal. No murmurs, rubs, or gallops.  ABDOMEN: Soft, nontender, nondistended. Bowel sounds present. No organomegaly or mass.  EXTREMITIES: No pedal edema, cyanosis, or clubbing.  NEUROLOGIC: Cranial nerves II through XII are intact. Muscle strength 5/5 in all extremities. Sensation intact. Gait not checked.   PSYCHIATRIC: The patient is alert and oriented x 3. Anxious SKIN: No obvious rash, lesion, or ulcer.    LABORATORY PANEL:   CBC  Recent Labs Lab 11/05/14 0428  WBC 9.0  HGB 9.7*  HCT 29.8*  PLT 225   ------------------------------------------------------------------------------------------------------------------  Chemistries   Recent Labs Lab 11/03/14 1220  11/05/14 0428  NA 137  < > 141  K 4.9  < > 4.4  CL 101  < > 109  CO2 27  < > 26  GLUCOSE 114*  < > 107*  BUN 19  < > 11  CREATININE 2.23*  < > 1.06*  CALCIUM 9.3  < > 8.5*  AST 27  --   --   ALT 12*  --   --   ALKPHOS 55  --   --   BILITOT 0.4  --   --   < > = values in this interval not displayed. ------------------------------------------------------------------------------------------------------------------  Cardiac Enzymes No results for input(s): TROPONINI in the last 168 hours. ------------------------------------------------------------------------------------------------------------------  RADIOLOGY:  Dg Chest Portable 1 View  11/03/2014   CLINICAL DATA:  Hypertension. Anxiety. Back pain. Fever and leukocytosis.  EXAM: PORTABLE CHEST - 1 VIEW  COMPARISON:  None.  FINDINGS: Patient has taken a poor inspiration. Artifact overlies the chest. Heart size is normal. Left lungs clear. There may be mild patchy density at the right base. No dense consolidation or lobar collapse. No effusion. Bony structures are unremarkable.  IMPRESSION: Question mild patchy density at the right base. Consider two-view chest radiography if concern persists.   Electronically Signed   By: Paulina FusiMark  Shogry M.D.   On: 11/03/2014 13:22    EKG:   Orders  placed or performed during the hospital encounter of 11/03/14  . EKG 12-Lead  . EKG 12-Lead    ASSESSMENT AND PLAN:   #1 narcotic and benzodiazepine overdose: Appreciate psych input, patient reports that she only took what she was supposed to and did not take any excessive  medications.  #2 hypotension: Holding antihypertensives.Pressures improving  #3 acute renal failure:Resolved with IV fluids  #4 aspiration pneumonia: Likely occurred in the setting of sedation due to overdose.Due to his persistent fever continue Unasyn  #5 bipolar disorder:Due to her being a little sleepy on hold her Xanax 3 she had psych input   All the records are reviewed and case discussed with Care Management/Social Workerr. Management plans discussed with the patient, family and they are in agreement.  CODE STATUS: Full  TOTAL TIME TAKING CARE OF THIS PATIENT: 35 minutes. Greater than 50% of time spent in care coordination and counseling. Spoke with the patient and her mother at the bedside today.     Auburn Bilberry M.D on 11/05/2014 at 11:11 AM  Between 7am to 6pm - Pager - [phone]  After 6pm go to [email or web address] - password [email or web address] [facility]  [place] [facility]  Office  [phone]  CC: Primary care physician; Altamese , MD

## 2014-11-06 MED ORDER — AMOXICILLIN-POT CLAVULANATE 875-125 MG PO TABS: 1.0000 | ORAL_TABLET | Freq: Two times a day (BID) | ORAL | Status: DC

## 2014-11-06 MED ORDER — OXYCODONE HCL 20 MG PO TABS: ORAL_TABLET | ORAL | Status: DC

## 2014-11-06 NOTE — Progress Notes (Signed)
Pt A and O x 4. VSS. Pt tolerating diet well with complaints of pain with meds given to control. Pt up and ambulating in room independetly. Pt had IV removed and prescriptions given to pt. Pt voiced understanding of discharge instructions with no further questions. Pt went down via wheelchair with volunteer.

## 2014-11-06 NOTE — Progress Notes (Signed)
Pt has been more alert this shift and oriented x3.  VSS. Afebrile. No distress noted. Oxycodone given once for chronic pain as ordered. Xanax given as ordered last night.  Rested quietly throughout night.  No other complaints voiced at this time.  Will cont to monitor.

## 2014-11-06 NOTE — Discharge Summary (Signed)
Jeanne Leach, 49 y.o., DOB 1965-05-12, MRN [id number]. Admission date: 11/03/2014 Discharge Date 11/06/2014 Primary MD Altamese Pace, MD Admitting Physician Auburn Bilberry, MD  Admission Diagnosis  Right lower lobe pneumonia [J18.9] Opioid overdose, undetermined intent, initial encounter [T40.2X4A] Acute renal failure, unspecified acute renal failure type [N17.9]  Discharge Diagnosis   Principal Problem:   Opioid use with opioid-induced mood disorder Active Problems:   Overdose   Opioid dependence   Panic disorder   Sedative dependence   Opioid overdose      Hospital Course : Jeanne Leach is a 49 y.o. female with a known history of chronic pain and drug abuse who is brought to the emergency room with decrease in responsiveness and somnolent. Patient took a large amount of unspecified quantity of narcotics at home. She was breathing actually when EMS arrived and was bagged until she she awoke somewhat with Narcan 2. Patient continued to have decrease in responsiveness therefore was started on a Narcan drip. She is currently still lethargic but opens her eyes from time to time. Her oxygen saturation is greater than 90s. He was admitted initially to the ICU on a Narcan drip. She continued to have intermittent fever she was noted to have aspiration pneumonia. Patient also continue to remain little sleepy ABG showed that her PCO2 is slightly elevated. Likely cause for her to be drowsy. Her sedating medications were held and her mental status is not improved. She is no longer febrile. I recommended that she be careful with her medications that she takes she was seen by psychiatry . but patient stated that she wanted her doctor due to that. At this time she is doing much better and stable for discharge.            Consults  phychiatry  Significant Tests:  See full reports for all details    Ct Head Wo Contrast  11/05/2014   CLINICAL DATA:  49 year old female with increasing confusion  and tremors, with fever for the past 3 days.  EXAM: CT HEAD WITHOUT CONTRAST  TECHNIQUE: Contiguous axial images were obtained from the base of the skull through the vertex without intravenous contrast.  COMPARISON:  No priors.  FINDINGS: No acute intracranial abnormalities. Specifically, no evidence of acute intracranial hemorrhage, no definite findings of acute/subacute cerebral ischemia, no mass, mass effect, hydrocephalus or abnormal intra or extra-axial fluid collections. Visualized paranasal sinuses and mastoids are well pneumatized. No acute displaced skull fractures are identified.  IMPRESSION: *No acute intracranial abnormalities. *The appearance of the brain is normal.   Electronically Signed   By: Trudie Reed M.D.   On: 11/05/2014 14:58   Dg Chest Portable 1 View  11/03/2014   CLINICAL DATA:  Hypertension. Anxiety. Back pain. Fever and leukocytosis.  EXAM: PORTABLE CHEST - 1 VIEW  COMPARISON:  None.  FINDINGS: Patient has taken a poor inspiration. Artifact overlies the chest. Heart size is normal. Left lungs clear. There may be mild patchy density at the right base. No dense consolidation or lobar collapse. No effusion. Bony structures are unremarkable.  IMPRESSION: Question mild patchy density at the right base. Consider two-view chest radiography if concern persists.   Electronically Signed   By: Paulina Fusi M.D.   On: 11/03/2014 13:22       Today   Subjective:   Jeanne Leach  feels much better more awake once to go home  Objective:   Blood pressure 159/91, pulse 90, temperature 98.7 F (37.1 C), temperature source Oral, resp. rate 18,  height 5\' 7"  (1.702 m), weight 86.183 kg (190 lb), last menstrual period 11/03/2014, SpO2 100 %.  .  Intake/Output Summary (Last 24 hours) at 11/06/14 1138 Last data filed at 11/06/14 1044  Gross per 24 hour  Intake   2749 ml  Output   1400 ml  Net   1349 ml    Exam VITAL SIGNS: Blood pressure 159/91, pulse 90, temperature 98.7 F (37.1  C), temperature source Oral, resp. rate 18, height 5\' 7"  (1.702 m), weight 86.183 kg (190 lb), last menstrual period 11/03/2014, SpO2 100 %.  GENERAL:  49 y.o.-year-old patient lying in the bed with no acute distress.  EYES: Pupils equal, round, reactive to light and accommodation. No scleral icterus. Extraocular muscles intact.  HEENT: Head atraumatic, normocephalic. Oropharynx and nasopharynx clear.  NECK:  Supple, no jugular venous distention. No thyroid enlargement, no tenderness.  LUNGS: Normal breath sounds bilaterally, no wheezing, rales,rhonchi or crepitation. No use of accessory muscles of respiration.  CARDIOVASCULAR: S1, S2 normal. No murmurs, rubs, or gallops.  ABDOMEN: Soft, nontender, nondistended. Bowel sounds present. No organomegaly or mass.  EXTREMITIES: No pedal edema, cyanosis, or clubbing.  NEUROLOGIC: Cranial nerves II through XII are intact. Muscle strength 5/5 in all extremities. Sensation intact. Gait not checked.  PSYCHIATRIC: The patient is alert and oriented x 3.  SKIN: No obvious rash, lesion, or ulcer.   Data Review     CBC w Diff: Lab Results  Component Value Date   WBC 9.0 11/05/2014   WBC 16.8* 05/21/2013   HGB 9.7* 11/05/2014   HGB 14.1 05/21/2013   HCT 29.8* 11/05/2014   HCT 43.0 05/21/2013   PLT 225 11/05/2014   PLT 507* 05/21/2013   LYMPHOPCT 18 08/17/2014   MONOPCT 5 08/17/2014   EOSPCT 2 08/17/2014   BASOPCT 1 08/17/2014   CMP: Lab Results  Component Value Date   NA 141 11/05/2014   NA 131* 05/21/2013   K 4.4 11/05/2014   K 3.6 05/21/2013   CL 109 11/05/2014   CL 96* 05/21/2013   CO2 26 11/05/2014   CO2 31 05/21/2013   BUN 11 11/05/2014   BUN 11 05/21/2013   CREATININE 1.06* 11/05/2014   CREATININE 0.94 05/21/2013   PROT 8.0 11/03/2014   PROT 9.3* 05/21/2013   ALBUMIN 4.1 11/03/2014   ALBUMIN 4.0 05/21/2013   BILITOT 0.4 11/03/2014   ALKPHOS 55 11/03/2014   ALKPHOS 62 05/21/2013   AST 27 11/03/2014   AST 20 05/21/2013    ALT 12* 11/03/2014   ALT 18 05/21/2013  .  Micro Results Recent Results (from the past 240 hour(s))  Urine culture     Status: None   Collection Time: 11/03/14  2:03 PM  Result Value Ref Range Status   Specimen Description URINE, RANDOM  Final   Special Requests Normal  Final   Culture   Final    MULTIPLE SPECIES PRESENT, SUGGEST RECOLLECTION IF CLINICALLY INDICATED   Report Status 11/05/2014 FINAL  Final  MRSA PCR Screening     Status: None   Collection Time: 11/03/14  6:13 PM  Result Value Ref Range Status   MRSA by PCR NEGATIVE NEGATIVE Final    Comment:        The GeneXpert MRSA Assay (FDA approved for NASAL specimens only), is one component of a comprehensive MRSA colonization surveillance program. It is not intended to diagnose MRSA infection nor to guide or monitor treatment for MRSA infections.  Code Status Orders        Start     Ordered   11/03/14 1715  Full code   Continuous     11/03/14 1714          Follow-up Information    Follow up with REESE,BETTI D, MD. Go on 11/13/2014.   Specialty:  Family Medicine   Why:  On 11/13/2014 you have a follow up with Dr. Pecola Leisureeese at 2:00pm.   Contact information:   [address] [place] [address] [phone]       Discharge Medications     Medication List    STOP taking these medications        cloNIDine 0.1 MG tablet  Commonly known as:  CATAPRES     valsartan-hydrochlorothiazide 320-25 MG per tablet  Commonly known as:  DIOVAN-HCT      TAKE these medications        alprazolam 2 MG tablet  Commonly known as:  XANAX  Take 0.5 tablets (1 mg total) by mouth 3 (three) times daily as needed for sleep or anxiety.     amoxicillin-clavulanate 875-125 MG per tablet  Commonly known as:  AUGMENTIN  Take 1 tablet by mouth 2 (two) times daily.     meclizine 50 MG tablet  Commonly known as:  ANTIVERT  Take 1 tablet (50 mg total) by mouth 3 (three) times daily as needed for  dizziness or nausea.     nicotine 14 mg/24hr patch  Commonly known as:  NICODERM CQ - dosed in mg/24 hours  Place 1 patch (14 mg total) onto the skin daily.     Oxycodone HCl 20 MG Tabs  Use only 1/2 tab every 6 hours as needed     valsartan 160 MG tablet  Commonly known as:  DIOVAN  Take 1 tablet (160 mg total) by mouth daily.           Total Time in preparing paper work, data evaluation and todays exam - 35 minutes  Auburn BilberryPATEL, Shandale Malak M.D on 11/06/2014 at 11:38 AM  [facility]   Office  [phone]

## 2014-11-06 NOTE — Discharge Instructions (Signed)
°  DIET:  Low sodium diet  DISCHARGE CONDITION:  Stable  ACTIVITY:  Activity as tolerated  OXYGEN:  Home Oxygen: No.   Oxygen Delivery: room air  DISCHARGE LOCATION:  home    ADDITIONAL DISCHARGE INSTRUCTION: be careful with the pain medications and anxiety medicatio   If you experience worsening of your admission symptoms, develop shortness of breath, life threatening emergency, suicidal or homicidal thoughts you must seek medical attention immediately by calling 911 or calling your MD immediately  if symptoms less severe.  You Must read complete instructions/literature along with all the possible adverse reactions/side effects for all the Medicines you take and that have been prescribed to you. Take any new Medicines after you have completely understood and accpet all the possible adverse reactions/side effects.   Please note  You were cared for by a hospitalist during your hospital stay. If you have any questions about your discharge medications or the care you received while you were in the hospital after you are discharged, you can call the unit and asked to speak with the hospitalist on call if the hospitalist that took care of you is not available. Once you are discharged, your primary care physician will handle any further medical issues. Please note that NO REFILLS for any discharge medications will be authorized once you are discharged, as it is imperative that you return to your primary care physician (or establish a relationship with a primary care physician if you do not have one) for your aftercare needs so that they can reassess your need for medications and monitor your lab values.

## 2014-11-07 LAB — GLUCOSE, CAPILLARY: Glucose-Capillary: 92 mg/dL (ref 65–99)

## 2015-05-16 ENCOUNTER — Encounter: Payer: Self-pay | Admitting: Emergency Medicine

## 2015-05-16 ENCOUNTER — Emergency Department
Admission: EM | Admit: 2015-05-16 | Discharge: 2015-05-16 | Disposition: A | Discharge status: Home / Self Care | Payer: Medicare Other | Attending: Emergency Medicine | Admitting: Emergency Medicine

## 2015-05-16 DIAGNOSIS — L03313 Cellulitis of chest wall: Secondary | ICD-10-CM | POA: Insufficient documentation

## 2015-05-16 DIAGNOSIS — Z792 Long term (current) use of antibiotics: Secondary | ICD-10-CM | POA: Diagnosis not present

## 2015-05-16 DIAGNOSIS — E119 Type 2 diabetes mellitus without complications: Secondary | ICD-10-CM | POA: Diagnosis not present

## 2015-05-16 DIAGNOSIS — F17200 Nicotine dependence, unspecified, uncomplicated: Secondary | ICD-10-CM | POA: Diagnosis not present

## 2015-05-16 DIAGNOSIS — I10 Essential (primary) hypertension: Secondary | ICD-10-CM | POA: Diagnosis not present

## 2015-05-16 DIAGNOSIS — L089 Local infection of the skin and subcutaneous tissue, unspecified: Secondary | ICD-10-CM | POA: Diagnosis present

## 2015-05-16 DIAGNOSIS — L03114 Cellulitis of left upper limb: Secondary | ICD-10-CM | POA: Diagnosis not present

## 2015-05-16 DIAGNOSIS — N61 Mastitis without abscess: Secondary | ICD-10-CM

## 2015-05-16 MED ORDER — SULFAMETHOXAZOLE-TRIMETHOPRIM 800-160 MG PO TABS: 2.0000 | ORAL_TABLET | Freq: Two times a day (BID) | ORAL | Status: DC

## 2015-05-16 NOTE — Discharge Instructions (Signed)
Take the antibiotic as directed for the infection in your skin. You should follow-up with Dr. Mayford KnifeWilliams or your other providers for ongoing symptoms.

## 2015-05-16 NOTE — ED Notes (Signed)
NAD noted at time of D/C. Pt denies questions or concerns. Pt ambulatory to the lobby at this time.  

## 2015-05-16 NOTE — ED Provider Notes (Signed)
[facility] Emergency Department Provider Note ____________________________________________  Time seen: 1815  I have reviewed the triage vital signs and the nursing notes.  HISTORY  Chief Complaint  Abscess  HPI Jeanne Leach is a 50 y.o. female presents to the ED for which she assumes is a "spider bite" to her left breast. She claims the area was just noted today, initially, and she describes pus drainage and she squeezed the area. Upon redirection she describes there is been there for a few days. She also reports subjective fevers. She reports that her remote history of boils and abscesses but denies any previous incision and drainage procedures. She reports a normal mammogram and 2016.  Past Medical History  Diagnosis Date  . Hypertension   . Degenerative disc disease, cervical   . Degenerative disc disease, lumbar   . Bipolar 1 disorder (HCC)   . Degenerative disc disease, cervical   . Anxiety   . Chronic back pain     Patient Active Problem List   Diagnosis Date Noted  . Opioid dependence (HCC) 11/04/2014  . Panic disorder 11/04/2014  . 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 Opioid overdose 11/04/2014  . Opioid use with opioid-induced mood disorder (HCC) 11/04/2014  . Overdose 11/03/2014  . Sepsis due to urinary tract infection (HCC) 08/08/2014  . Septic shock (HCC) 08/08/2014  . Diabetes mellitus without complication (HCC) 08/07/2014  . Fall 08/07/2014  . Hypertension   . Degenerative disc disease, cervical   . Bipolar 1 disorder (HCC)   . AKI (acute kidney injury) [facility](HCC)     Past Surgical History  Procedure Laterality Date  . Cesarean section    . Lipoma excision      Current Outpatient Rx  Name  Route  Sig  Dispense  Refill  . alprazolam (XANAX) 2 MG tablet   Oral   Take 0.5 tablets (1 mg total) by mouth 3 (three) times daily as needed for sleep or anxiety. Patient taking differently: Take 2 mg by mouth every 6 (six) hours as  needed for anxiety.    30 tablet   0   . amoxicillin-clavulanate (AUGMENTIN) 875-125 MG per tablet   Oral   Take 1 tablet by mouth 2 (two) times daily.   8 tablet   2   . meclizine (ANTIVERT) 50 MG tablet   Oral   Take 1 tablet (50 mg total) by mouth 3 (three) times daily as needed for dizziness or nausea. Patient not taking: Reported on 10/13/2014   30 tablet   0   . nicotine (NICODERM CQ - DOSED IN MG/24 HOURS) 14 mg/24hr patch   Transdermal   Place 1 patch (14 mg total) onto the skin daily. Patient not taking: Reported on 08/17/2014   28 patch   0   . Oxycodone HCl 20 MG TABS      Use only 1/2 tab every 6 hours as needed   10 tablet   0   . sulfamethoxazole-trimethoprim (BACTRIM DS,SEPTRA DS) 800-160 MG tablet   Oral   Take 2 tablets by mouth 2 (two) times daily.   20 tablet   0   . valsartan (DIOVAN) 160 MG tablet   Oral   Take 1 tablet (160 mg total) by mouth daily. Patient not taking: Reported on 10/13/2014   30 tablet   0     Allergies Review of patient's allergies indicates no known allergies.  Family History  Problem Relation Age of Onset  . Hypertension  Social History Social History  Substance Use Topics  . Smoking status: Current Every Day Smoker -- 1.00 packs/day  . Smokeless tobacco: Never Used  . Alcohol Use: No   Review of Systems  Constitutional: Negative for fever. Eyes: Negative for visual changes. ENT: Negative for sore throat. Cardiovascular: Negative for chest pain. Respiratory: Negative for shortness of breath. Gastrointestinal: Negative for abdominal pain, vomiting and diarrhea. Genitourinary: Negative for dysuria. Musculoskeletal: Negative for back pain. Skin: Negative for rash. Left breast skin infection Neurological: Negative for headaches, focal weakness or numbness. ____________________________________________  PHYSICAL EXAM:  VITAL SIGNS: ED Triage Vitals  Enc Vitals Group     BP 05/16/15 1723 154/74 mmHg      Pulse Rate 05/16/15 1723 68     Resp 05/16/15 1723 20     Temp 05/16/15 1723 98.3 F (36.8 C)     Temp Source 05/16/15 1723 Oral     SpO2 05/16/15 1723 98 %     Weight 05/16/15 1723 200 lb (90.719 kg)     Height 05/16/15 1723 5\' 5"  (1.651 m)     Head Cir --      Peak Flow --      Pain Score 05/16/15 1724 8     Pain Loc --      Pain Edu? --      Excl. in GC? --    Constitutional: Alert and oriented. Well appearing and in no distress. Head: Normocephalic and atraumatic.      Eyes: Conjunctivae are normal. PERRL. Normal extraocular movements      Ears: Canals clear. TMs intact bilaterally.   Nose: No congestion/rhinorrhea.   Mouth/Throat: Mucous membranes are moist.   Neck: Supple. No thyromegaly. Hematological/Lymphatic/Immunological: No cervical lymphadenopathy. No palpable supra or subclavicular nodes, no axillary nodes. Cardiovascular: Normal rate, regular rhythm.  Respiratory: Normal respiratory effort. No wheezes/rales/rhonchi. Gastrointestinal: Soft and nontender. No distention. Musculoskeletal: Nontender with normal range of motion in all extremities.  Neurologic:  Normal gait without ataxia. Normal speech and language. No gross focal neurologic deficits are appreciated. Skin:  Skin is warm, dry and intact. No rash noted. Left breast with a 2 cm scabbed area to the 7 o'clock region of the areola. There is no local erythema, fluctuance, warmth, or induration. There is no pointing, or central lesion appreciated. The area appears several days old with a dried scab in place. There is no nipple discharge on manipulation. Psychiatric: Mood and affect are normal. Patient exhibits appropriate insight and judgment. ____________________________________________  INITIAL IMPRESSION / ASSESSMENT AND PLAN / ED COURSE  Patient presents to the ED with a resolving left breast cellulitis. The area does not appear to be acutely inflamed nor does it show any abscess collection at this  time. Patient is advised to dose the Bactrim as directed. She is informed that despite her request for "something stronger" then [facility] powders. She will not be provided with a narcotic prescription as she receives a regular 90-day supply of oxycodone from her primary provider. She is to follow up with her primary care provider for wound check as needed. ____________________________________________  FINAL CLINICAL IMPRESSION(S) / ED DIAGNOSES  Final diagnoses:  Cellulitis of left breast      Lissa Hoard, PA-C 05/16/15 1917  Arnaldo Natal, MD 05/17/15 [phone]

## 2015-05-16 NOTE — ED Notes (Signed)
Reports boil to left breast

## 2015-06-06 DIAGNOSIS — E669 Obesity, unspecified: Secondary | ICD-10-CM | POA: Diagnosis not present

## 2015-06-06 DIAGNOSIS — I10 Essential (primary) hypertension: Secondary | ICD-10-CM | POA: Diagnosis not present

## 2015-06-06 DIAGNOSIS — G894 Chronic pain syndrome: Secondary | ICD-10-CM | POA: Diagnosis not present

## 2015-06-06 DIAGNOSIS — F419 Anxiety disorder, unspecified: Secondary | ICD-10-CM | POA: Diagnosis not present

## 2015-08-01 DIAGNOSIS — F319 Bipolar disorder, unspecified: Secondary | ICD-10-CM | POA: Diagnosis not present

## 2015-08-01 DIAGNOSIS — I10 Essential (primary) hypertension: Secondary | ICD-10-CM | POA: Diagnosis not present

## 2015-08-01 DIAGNOSIS — T424X4A Poisoning by benzodiazepines, undetermined, initial encounter: Secondary | ICD-10-CM | POA: Diagnosis not present

## 2015-08-01 DIAGNOSIS — G4700 Insomnia, unspecified: Secondary | ICD-10-CM | POA: Diagnosis not present

## 2015-08-01 DIAGNOSIS — E0500 Thyrotoxicosis with diffuse goiter without thyrotoxic crisis or storm: Secondary | ICD-10-CM | POA: Diagnosis not present

## 2015-08-01 DIAGNOSIS — T424X5A Adverse effect of benzodiazepines, initial encounter: Secondary | ICD-10-CM | POA: Diagnosis not present

## 2015-08-01 DIAGNOSIS — R4182 Altered mental status, unspecified: Secondary | ICD-10-CM | POA: Diagnosis not present

## 2015-08-01 DIAGNOSIS — E669 Obesity, unspecified: Secondary | ICD-10-CM | POA: Diagnosis not present

## 2015-08-01 DIAGNOSIS — Z6836 Body mass index (BMI) 36.0-36.9, adult: Secondary | ICD-10-CM | POA: Diagnosis not present

## 2015-08-05 DIAGNOSIS — F419 Anxiety disorder, unspecified: Secondary | ICD-10-CM | POA: Diagnosis not present

## 2015-08-05 DIAGNOSIS — I10 Essential (primary) hypertension: Secondary | ICD-10-CM | POA: Diagnosis not present

## 2015-08-05 DIAGNOSIS — G894 Chronic pain syndrome: Secondary | ICD-10-CM | POA: Diagnosis not present

## 2015-08-05 DIAGNOSIS — M1380 Other specified arthritis, unspecified site: Secondary | ICD-10-CM | POA: Diagnosis not present

## 2015-10-04 ENCOUNTER — Emergency Department (HOSPITAL_COMMUNITY): Payer: Medicare Other

## 2015-10-04 ENCOUNTER — Encounter (HOSPITAL_COMMUNITY): Payer: Self-pay

## 2015-10-04 ENCOUNTER — Emergency Department (HOSPITAL_COMMUNITY)
Admission: EM | Admit: 2015-10-04 | Discharge: 2015-10-04 | Disposition: A | Discharge status: Home / Self Care | Payer: Medicare Other | Attending: Emergency Medicine | Admitting: Emergency Medicine

## 2015-10-04 DIAGNOSIS — S022XXA Fracture of nasal bones, initial encounter for closed fracture: Secondary | ICD-10-CM | POA: Diagnosis not present

## 2015-10-04 DIAGNOSIS — M542 Cervicalgia: Secondary | ICD-10-CM | POA: Diagnosis not present

## 2015-10-04 DIAGNOSIS — F17200 Nicotine dependence, unspecified, uncomplicated: Secondary | ICD-10-CM | POA: Insufficient documentation

## 2015-10-04 DIAGNOSIS — T50904A Poisoning by unspecified drugs, medicaments and biological substances, undetermined, initial encounter: Secondary | ICD-10-CM

## 2015-10-04 DIAGNOSIS — I10 Essential (primary) hypertension: Secondary | ICD-10-CM | POA: Insufficient documentation

## 2015-10-04 DIAGNOSIS — S098XXA Other specified injuries of head, initial encounter: Secondary | ICD-10-CM | POA: Diagnosis not present

## 2015-10-04 DIAGNOSIS — F319 Bipolar disorder, unspecified: Secondary | ICD-10-CM | POA: Diagnosis not present

## 2015-10-04 DIAGNOSIS — S0990XA Unspecified injury of head, initial encounter: Secondary | ICD-10-CM | POA: Insufficient documentation

## 2015-10-04 DIAGNOSIS — Y999 Unspecified external cause status: Secondary | ICD-10-CM | POA: Insufficient documentation

## 2015-10-04 DIAGNOSIS — J3489 Other specified disorders of nose and nasal sinuses: Secondary | ICD-10-CM | POA: Diagnosis not present

## 2015-10-04 DIAGNOSIS — M79605 Pain in left leg: Secondary | ICD-10-CM | POA: Insufficient documentation

## 2015-10-04 DIAGNOSIS — T424X4A Poisoning by benzodiazepines, undetermined, initial encounter: Secondary | ICD-10-CM | POA: Diagnosis not present

## 2015-10-04 DIAGNOSIS — J9811 Atelectasis: Secondary | ICD-10-CM | POA: Diagnosis not present

## 2015-10-04 DIAGNOSIS — Y9389 Activity, other specified: Secondary | ICD-10-CM | POA: Insufficient documentation

## 2015-10-04 DIAGNOSIS — S0992XA Unspecified injury of nose, initial encounter: Secondary | ICD-10-CM | POA: Diagnosis present

## 2015-10-04 DIAGNOSIS — Y929 Unspecified place or not applicable: Secondary | ICD-10-CM | POA: Insufficient documentation

## 2015-10-04 DIAGNOSIS — T50994A Poisoning by other drugs, medicaments and biological substances, undetermined, initial encounter: Secondary | ICD-10-CM | POA: Diagnosis not present

## 2015-10-04 DIAGNOSIS — I959 Hypotension, unspecified: Secondary | ICD-10-CM | POA: Diagnosis not present

## 2015-10-04 DIAGNOSIS — S0993XA Unspecified injury of face, initial encounter: Secondary | ICD-10-CM | POA: Diagnosis not present

## 2015-10-04 DIAGNOSIS — R4781 Slurred speech: Secondary | ICD-10-CM | POA: Diagnosis not present

## 2015-10-04 LAB — RAPID URINE DRUG SCREEN, HOSP PERFORMED
AMPHETAMINES: POSITIVE — AB
BARBITURATES: NOT DETECTED
Benzodiazepines: POSITIVE — AB
Cocaine: NOT DETECTED
OPIATES: POSITIVE — AB
TETRAHYDROCANNABINOL: NOT DETECTED

## 2015-10-04 LAB — COMPREHENSIVE METABOLIC PANEL
ALBUMIN: 3.8 g/dL (ref 3.5–5.0)
ALK PHOS: 47 U/L (ref 38–126)
ALT: 41 U/L (ref 14–54)
AST: 88 U/L — AB (ref 15–41)
Anion gap: 7 (ref 5–15)
BILIRUBIN TOTAL: 0.2 mg/dL — AB (ref 0.3–1.2)
BUN: 19 mg/dL (ref 6–20)
CALCIUM: 8.9 mg/dL (ref 8.9–10.3)
CO2: 30 mmol/L (ref 22–32)
CREATININE: 1.91 mg/dL — AB (ref 0.44–1.00)
Chloride: 94 mmol/L — ABNORMAL LOW (ref 101–111)
GFR calc Af Amer: 35 mL/min — ABNORMAL LOW (ref >60)
GFR, EST NON AFRICAN AMERICAN: 30 mL/min — AB (ref >60)
GLUCOSE: 122 mg/dL — AB (ref 65–99)
Potassium: 3.7 mmol/L (ref 3.5–5.1)
Sodium: 131 mmol/L — ABNORMAL LOW (ref 135–145)
TOTAL PROTEIN: 7.3 g/dL (ref 6.5–8.1)

## 2015-10-04 LAB — CBC WITH DIFFERENTIAL/PLATELET
BASOS ABS: 0 10*3/uL (ref 0.0–0.1)
Basophils Relative: 0 %
Eosinophils Absolute: 0.4 10*3/uL (ref 0.0–0.7)
Eosinophils Relative: 4 %
HEMATOCRIT: 35.1 % — AB (ref 36.0–46.0)
HEMOGLOBIN: 11.5 g/dL — AB (ref 12.0–15.0)
LYMPHS PCT: 22 %
Lymphs Abs: 2.2 10*3/uL (ref 0.7–4.0)
MCH: 30.6 pg (ref 26.0–34.0)
MCHC: 32.8 g/dL (ref 30.0–36.0)
MCV: 93.4 fL (ref 78.0–100.0)
MONO ABS: 0.6 10*3/uL (ref 0.1–1.0)
Monocytes Relative: 7 %
NEUTROS ABS: 6.6 10*3/uL (ref 1.7–7.7)
NEUTROS PCT: 67 %
Platelets: 349 10*3/uL (ref 150–400)
RBC: 3.76 MIL/uL — ABNORMAL LOW (ref 3.87–5.11)
RDW: 13.3 % (ref 11.5–15.5)
WBC: 9.8 10*3/uL (ref 4.0–10.5)

## 2015-10-04 LAB — I-STAT CHEM 8, ED
BUN: 18 mg/dL (ref 6–20)
CALCIUM ION: 1.15 mmol/L (ref 1.12–1.23)
CHLORIDE: 97 mmol/L — AB (ref 101–111)
CREATININE: 1.7 mg/dL — AB (ref 0.44–1.00)
GLUCOSE: 100 mg/dL — AB (ref 65–99)
HCT: 31 % — ABNORMAL LOW (ref 36.0–46.0)
HEMOGLOBIN: 10.5 g/dL — AB (ref 12.0–15.0)
POTASSIUM: 3.8 mmol/L (ref 3.5–5.1)
Sodium: 139 mmol/L (ref 135–145)
TCO2: 30 mmol/L (ref 0–100)

## 2015-10-04 LAB — TYPE AND SCREEN
ABO/RH(D): A POS
Antibody Screen: NEGATIVE

## 2015-10-04 LAB — URINALYSIS, ROUTINE W REFLEX MICROSCOPIC
BILIRUBIN URINE: NEGATIVE
Glucose, UA: NEGATIVE mg/dL
HGB URINE DIPSTICK: NEGATIVE
Ketones, ur: NEGATIVE mg/dL
Leukocytes, UA: NEGATIVE
Nitrite: NEGATIVE
PH: 5.5 (ref 5.0–8.0)
Protein, ur: NEGATIVE mg/dL
SPECIFIC GRAVITY, URINE: 1.025 (ref 1.005–1.030)

## 2015-10-04 LAB — I-STAT CG4 LACTIC ACID, ED
LACTIC ACID, VENOUS: 1.64 mmol/L (ref 0.5–2.0)
LACTIC ACID, VENOUS: 1.06 mmol/L (ref 0.5–2.0)

## 2015-10-04 LAB — ETHANOL

## 2015-10-04 LAB — POC URINE PREG, ED: Preg Test, Ur: NEGATIVE

## 2015-10-04 MED ORDER — SODIUM CHLORIDE 0.9 % IV SOLN
Freq: Once | INTRAVENOUS | Status: AC
  Administered 2015-10-04: 16:00:00 via INTRAVENOUS

## 2015-10-04 MED ORDER — ONDANSETRON HCL 4 MG PO TABS: 4.0000 mg | ORAL_TABLET | Freq: Three times a day (TID) | ORAL | Status: DC | PRN

## 2015-10-04 MED ORDER — IBUPROFEN 400 MG PO TABS
600.0000 mg | ORAL_TABLET | Freq: Three times a day (TID) | ORAL | Status: DC | PRN
  Administered 2015-10-04: 600 mg via ORAL
  Filled 2015-10-04: qty 2

## 2015-10-04 MED ORDER — ACETAMINOPHEN 325 MG PO TABS: 650.0000 mg | ORAL_TABLET | ORAL | Status: DC | PRN

## 2015-10-04 MED ORDER — SODIUM CHLORIDE 0.9 % IV BOLUS (SEPSIS)
2000.0000 mL | Freq: Once | INTRAVENOUS | Status: AC
  Administered 2015-10-04: 2000 mL via INTRAVENOUS

## 2015-10-04 MED ORDER — NICOTINE 21 MG/24HR TD PT24
21.0000 mg | MEDICATED_PATCH | Freq: Every day | TRANSDERMAL | Status: DC
  Administered 2015-10-04: 21 mg via TRANSDERMAL
  Filled 2015-10-04: qty 1
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
## 2015-10-04 NOTE — ED Notes (Signed)
Patient still in CT.
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
## 2015-12-03 IMAGING — CT CT HEAD W/O CM
1 series · 16 of 29 positions shown, 20 images · non-contrast
Comparison: No priors.

CLINICAL DATA: 48-year-old female with increasing confusion and
tremors, with fever for the past 3 days.

EXAM:
CT HEAD WITHOUT CONTRAST
TECHNIQUE: Contiguous axial images were obtained from the base of the skull
through the vertex without intravenous contrast.

[Series 2: head wo · axial · 0.39mm/px · z∈[-118,+12]mm · 16 of 29 slices shown, 20 images]
[im 2/29  brain]
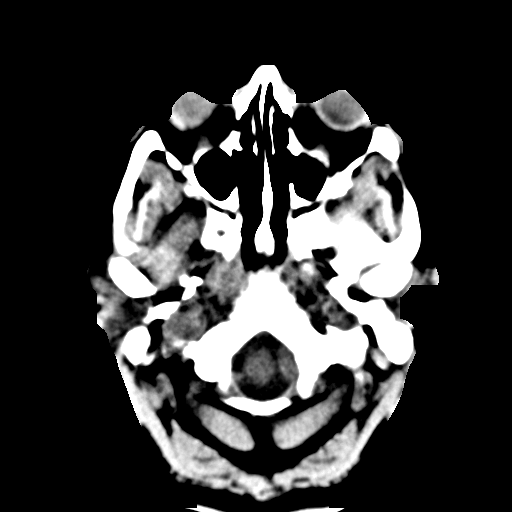
[im 2/29  bone]
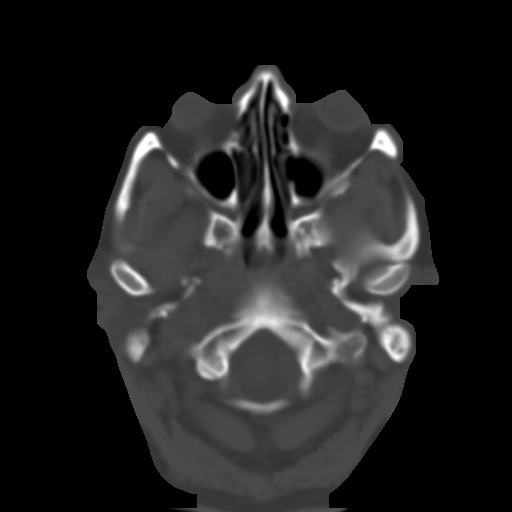
[im 4/29  brain]
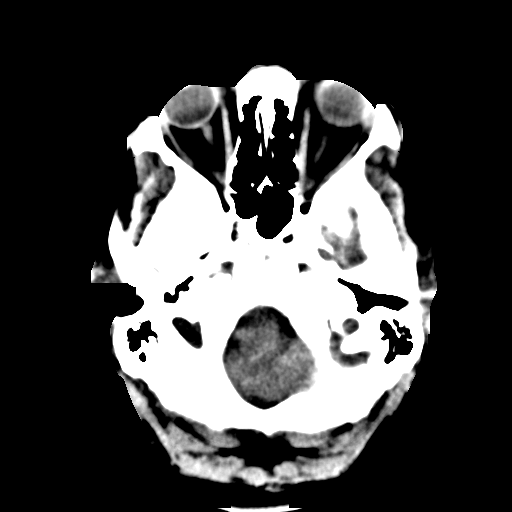
[im 6/29  brain]
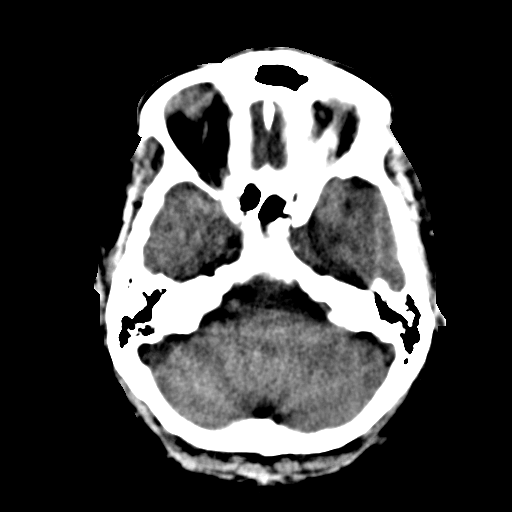
[im 7/29  brain]
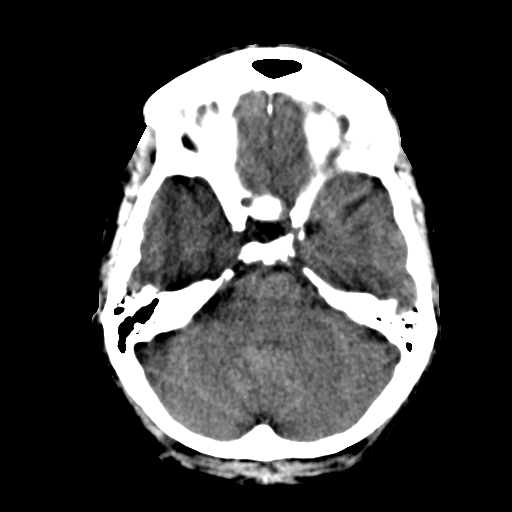
[im 9/29  brain]
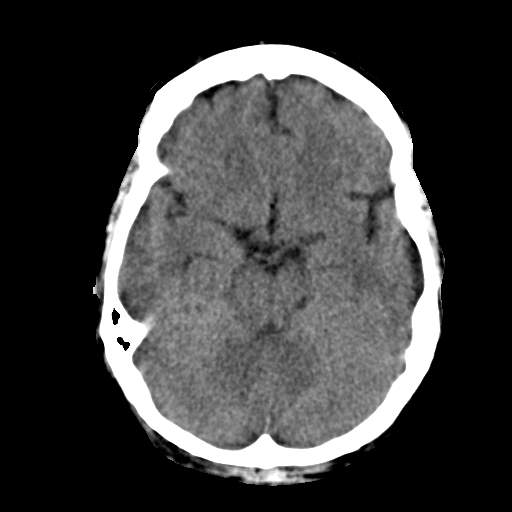
[im 9/29  bone]
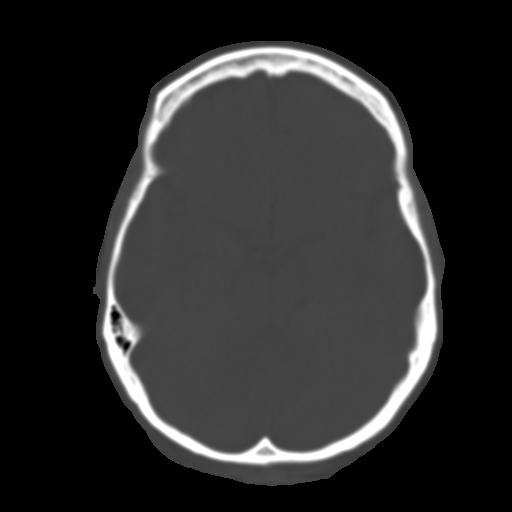
[im 11/29  brain]
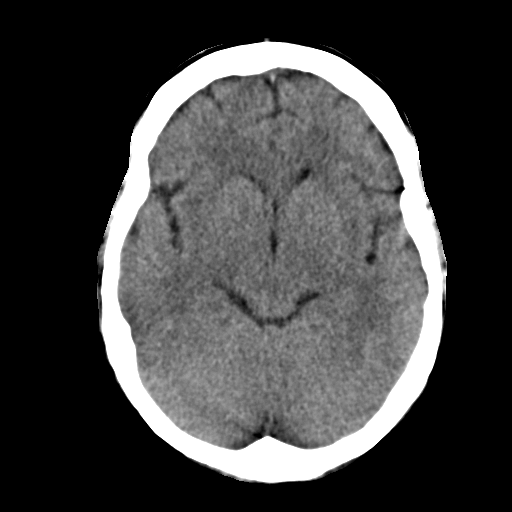
[im 12/29  brain]
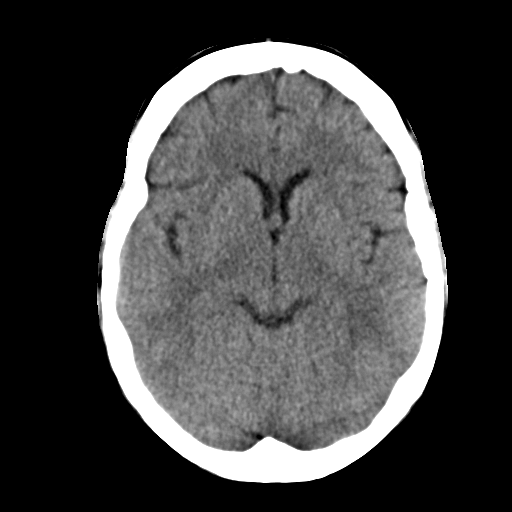
[im 14/29  brain]
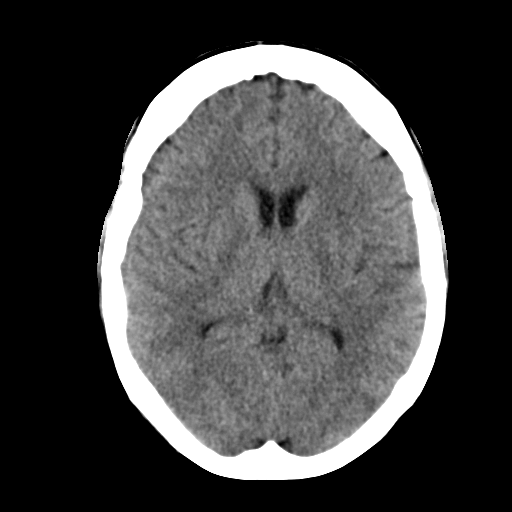
[im 16/29  brain]
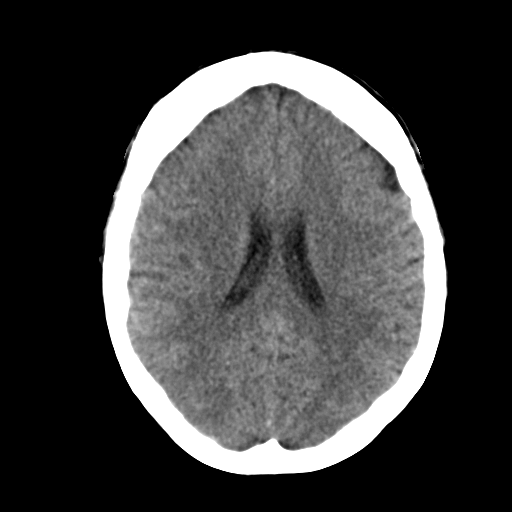
[im 16/29  bone]
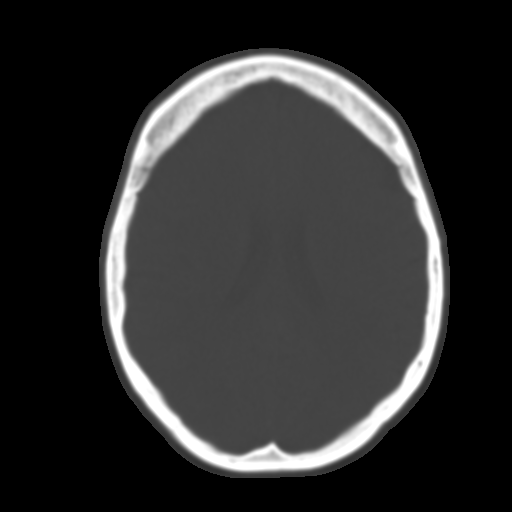
[im 18/29  brain]
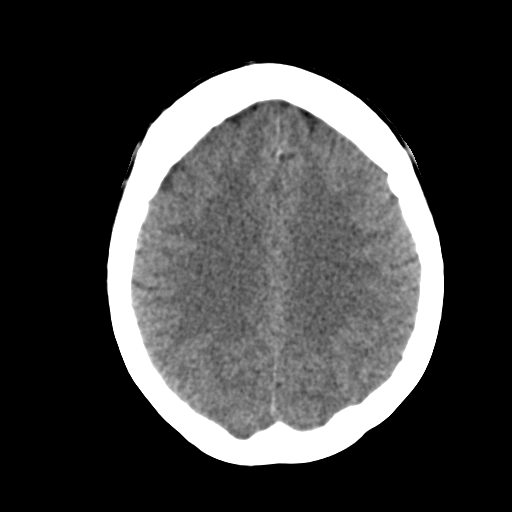
[im 19/29  brain]
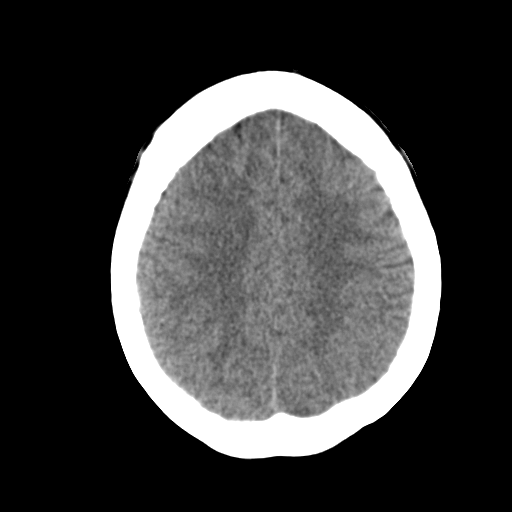
[im 21/29  brain]
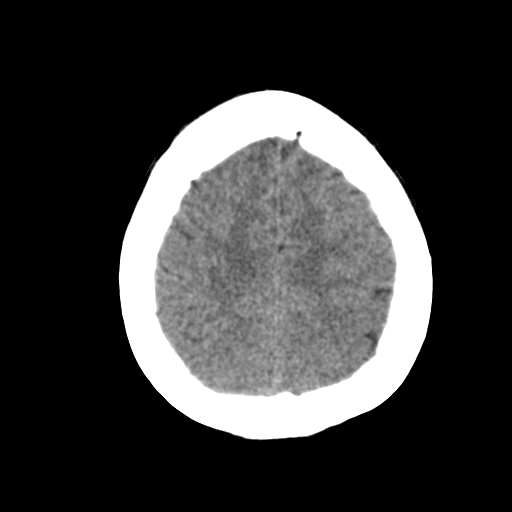
[im 23/29  brain]
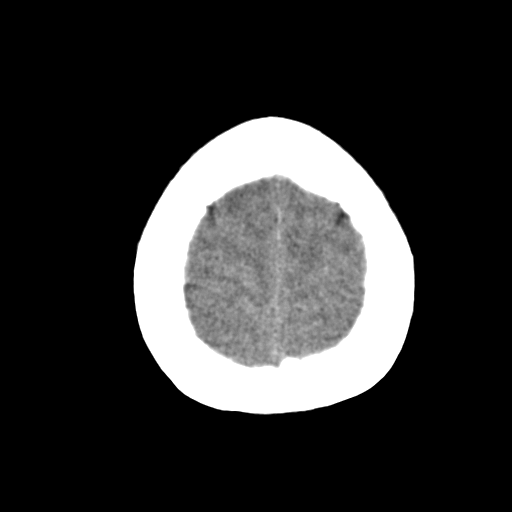
[im 23/29  bone]
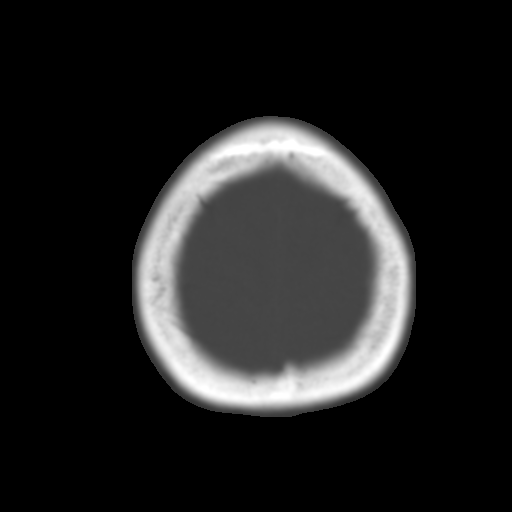
[im 24/29  brain]
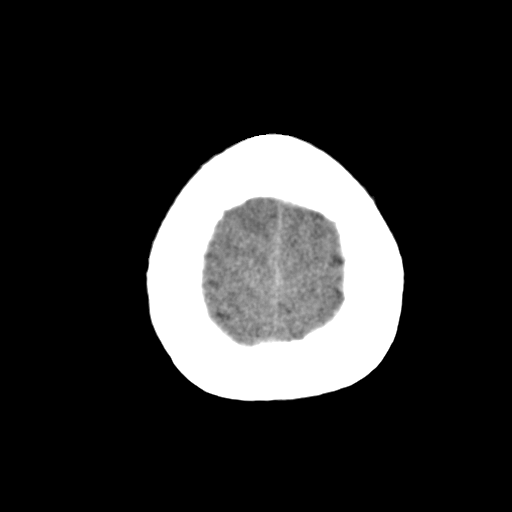
[im 26/29  brain]
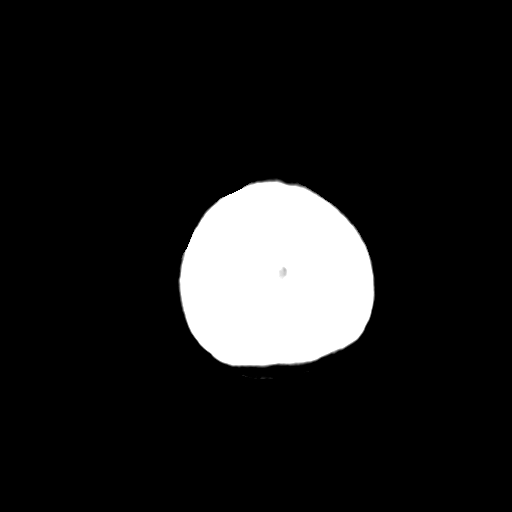
[im 28/29  brain]
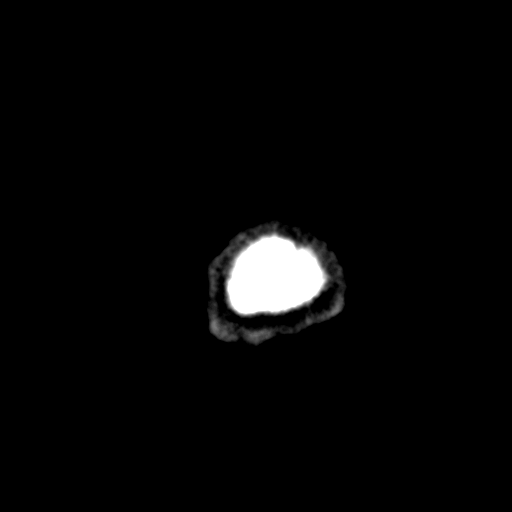

[16 of 29 positions shown; findings below may reference images not displayed]

FINDINGS: No acute intracranial abnormalities. Specifically, no evidence of
acute intracranial hemorrhage, no definite findings of
acute/subacute cerebral ischemia, no mass, mass effect,
hydrocephalus or abnormal intra or extra-axial fluid collections.
Visualized paranasal sinuses and mastoids are well pneumatized. No
acute displaced skull fractures are identified.
IMPRESSION: *No acute intracranial abnormalities.
*The appearance of the brain is normal.

## 2016-01-07 DIAGNOSIS — Z79899 Other long term (current) drug therapy: Secondary | ICD-10-CM | POA: Diagnosis not present

## 2016-01-07 DIAGNOSIS — S8391XA Sprain of unspecified site of right knee, initial encounter: Secondary | ICD-10-CM | POA: Diagnosis not present

## 2016-01-07 DIAGNOSIS — M25561 Pain in right knee: Secondary | ICD-10-CM | POA: Diagnosis not present

## 2016-01-07 DIAGNOSIS — S93402A Sprain of unspecified ligament of left ankle, initial encounter: Secondary | ICD-10-CM | POA: Diagnosis not present

## 2016-01-07 DIAGNOSIS — S8991XA Unspecified injury of right lower leg, initial encounter: Secondary | ICD-10-CM | POA: Diagnosis not present

## 2016-01-07 DIAGNOSIS — M79606 Pain in leg, unspecified: Secondary | ICD-10-CM | POA: Diagnosis not present

## 2016-01-07 DIAGNOSIS — R55 Syncope and collapse: Secondary | ICD-10-CM | POA: Diagnosis not present

## 2016-01-07 DIAGNOSIS — I10 Essential (primary) hypertension: Secondary | ICD-10-CM | POA: Diagnosis not present

## 2016-01-07 DIAGNOSIS — M1711 Unilateral primary osteoarthritis, right knee: Secondary | ICD-10-CM | POA: Diagnosis not present

## 2016-01-07 DIAGNOSIS — S9002XA Contusion of left ankle, initial encounter: Secondary | ICD-10-CM | POA: Diagnosis not present

## 2016-01-07 DIAGNOSIS — T148 Other injury of unspecified body region: Secondary | ICD-10-CM | POA: Diagnosis not present

## 2016-01-07 DIAGNOSIS — M25572 Pain in left ankle and joints of left foot: Secondary | ICD-10-CM | POA: Diagnosis not present

## 2016-01-07 DIAGNOSIS — R569 Unspecified convulsions: Secondary | ICD-10-CM | POA: Diagnosis not present

## 2016-01-07 DIAGNOSIS — S99912A Unspecified injury of left ankle, initial encounter: Secondary | ICD-10-CM | POA: Diagnosis not present

## 2016-01-07 DIAGNOSIS — M542 Cervicalgia: Secondary | ICD-10-CM | POA: Diagnosis not present

## 2016-01-07 DIAGNOSIS — S8001XA Contusion of right knee, initial encounter: Secondary | ICD-10-CM | POA: Diagnosis not present

## 2016-01-22 DIAGNOSIS — G894 Chronic pain syndrome: Secondary | ICD-10-CM | POA: Diagnosis not present

## 2016-05-08 DIAGNOSIS — Z79899 Other long term (current) drug therapy: Secondary | ICD-10-CM | POA: Diagnosis not present

## 2016-05-08 DIAGNOSIS — F17200 Nicotine dependence, unspecified, uncomplicated: Secondary | ICD-10-CM | POA: Diagnosis not present

## 2016-05-08 DIAGNOSIS — Z86718 Personal history of other venous thrombosis and embolism: Secondary | ICD-10-CM | POA: Diagnosis not present

## 2016-05-08 DIAGNOSIS — E876 Hypokalemia: Secondary | ICD-10-CM | POA: Diagnosis not present

## 2016-05-08 DIAGNOSIS — Z5321 Procedure and treatment not carried out due to patient leaving prior to being seen by health care provider: Secondary | ICD-10-CM | POA: Diagnosis not present

## 2016-05-08 DIAGNOSIS — Z720 Tobacco use: Secondary | ICD-10-CM | POA: Diagnosis not present

## 2016-05-08 DIAGNOSIS — R51 Headache: Secondary | ICD-10-CM | POA: Diagnosis not present

## 2016-05-08 DIAGNOSIS — R1030 Lower abdominal pain, unspecified: Secondary | ICD-10-CM | POA: Diagnosis not present

## 2016-05-08 DIAGNOSIS — R079 Chest pain, unspecified: Secondary | ICD-10-CM | POA: Diagnosis not present

## 2016-05-08 DIAGNOSIS — I10 Essential (primary) hypertension: Secondary | ICD-10-CM | POA: Diagnosis not present

## 2016-09-27 DIAGNOSIS — Z79899 Other long term (current) drug therapy: Secondary | ICD-10-CM | POA: Diagnosis not present

## 2016-09-27 DIAGNOSIS — I10 Essential (primary) hypertension: Secondary | ICD-10-CM | POA: Diagnosis not present

## 2016-09-27 DIAGNOSIS — F419 Anxiety disorder, unspecified: Secondary | ICD-10-CM | POA: Diagnosis not present

## 2016-09-27 DIAGNOSIS — R52 Pain, unspecified: Secondary | ICD-10-CM | POA: Diagnosis not present

## 2016-10-17 DIAGNOSIS — I10 Essential (primary) hypertension: Secondary | ICD-10-CM | POA: Diagnosis not present

## 2016-10-19 DIAGNOSIS — M25561 Pain in right knee: Secondary | ICD-10-CM | POA: Diagnosis not present

## 2016-10-19 DIAGNOSIS — M1711 Unilateral primary osteoarthritis, right knee: Secondary | ICD-10-CM | POA: Diagnosis not present

## 2016-12-13 DIAGNOSIS — R5382 Chronic fatigue, unspecified: Secondary | ICD-10-CM | POA: Diagnosis not present

## 2016-12-27 DIAGNOSIS — F419 Anxiety disorder, unspecified: Secondary | ICD-10-CM | POA: Diagnosis not present

## 2016-12-27 DIAGNOSIS — I10 Essential (primary) hypertension: Secondary | ICD-10-CM | POA: Diagnosis not present

## 2016-12-27 DIAGNOSIS — R52 Pain, unspecified: Secondary | ICD-10-CM | POA: Diagnosis not present

## 2016-12-27 DIAGNOSIS — Z79899 Other long term (current) drug therapy: Secondary | ICD-10-CM | POA: Diagnosis not present

## 2017-11-21 DIAGNOSIS — I10 Essential (primary) hypertension: Secondary | ICD-10-CM | POA: Diagnosis not present

## 2017-11-21 DIAGNOSIS — E119 Type 2 diabetes mellitus without complications: Secondary | ICD-10-CM | POA: Diagnosis not present

## 2017-11-21 DIAGNOSIS — F17210 Nicotine dependence, cigarettes, uncomplicated: Secondary | ICD-10-CM | POA: Diagnosis not present

## 2017-11-21 DIAGNOSIS — K047 Periapical abscess without sinus: Secondary | ICD-10-CM | POA: Diagnosis not present

## 2017-11-21 DIAGNOSIS — M549 Dorsalgia, unspecified: Secondary | ICD-10-CM | POA: Diagnosis not present

## 2017-11-21 DIAGNOSIS — G8929 Other chronic pain: Secondary | ICD-10-CM | POA: Diagnosis not present

## 2017-12-15 DIAGNOSIS — F419 Anxiety disorder, unspecified: Secondary | ICD-10-CM | POA: Diagnosis not present

## 2017-12-15 DIAGNOSIS — G894 Chronic pain syndrome: Secondary | ICD-10-CM | POA: Diagnosis not present

## 2018-01-05 DIAGNOSIS — M179 Osteoarthritis of knee, unspecified: Secondary | ICD-10-CM | POA: Diagnosis not present

## 2018-01-05 DIAGNOSIS — R5382 Chronic fatigue, unspecified: Secondary | ICD-10-CM | POA: Diagnosis not present

## 2018-01-05 DIAGNOSIS — I10 Essential (primary) hypertension: Secondary | ICD-10-CM | POA: Diagnosis not present

## 2018-01-05 DIAGNOSIS — E039 Hypothyroidism, unspecified: Secondary | ICD-10-CM | POA: Diagnosis not present

## 2018-03-27 ENCOUNTER — Other Ambulatory Visit: Payer: Self-pay

## 2018-03-27 ENCOUNTER — Emergency Department (HOSPITAL_COMMUNITY): Payer: Medicare Other

## 2018-03-27 ENCOUNTER — Inpatient Hospital Stay (HOSPITAL_COMMUNITY)
Admission: EM | Admit: 2018-03-27 | Discharge: 2018-04-04 | DRG: 871 | Disposition: A | Discharge status: Home Health | Payer: Medicare Other | Attending: Internal Medicine | Admitting: Internal Medicine

## 2018-03-27 ENCOUNTER — Encounter (HOSPITAL_COMMUNITY): Payer: Self-pay | Admitting: Emergency Medicine

## 2018-03-27 DIAGNOSIS — J9601 Acute respiratory failure with hypoxia: Secondary | ICD-10-CM | POA: Diagnosis not present

## 2018-03-27 DIAGNOSIS — E7800 Pure hypercholesterolemia, unspecified: Secondary | ICD-10-CM | POA: Diagnosis present

## 2018-03-27 DIAGNOSIS — E1122 Type 2 diabetes mellitus with diabetic chronic kidney disease: Secondary | ICD-10-CM | POA: Diagnosis present

## 2018-03-27 DIAGNOSIS — I10 Essential (primary) hypertension: Secondary | ICD-10-CM | POA: Diagnosis present

## 2018-03-27 DIAGNOSIS — I129 Hypertensive chronic kidney disease with stage 1 through stage 4 chronic kidney disease, or unspecified chronic kidney disease: Secondary | ICD-10-CM | POA: Diagnosis present

## 2018-03-27 DIAGNOSIS — F17210 Nicotine dependence, cigarettes, uncomplicated: Secondary | ICD-10-CM | POA: Diagnosis present

## 2018-03-27 DIAGNOSIS — M6282 Rhabdomyolysis: Secondary | ICD-10-CM | POA: Diagnosis present

## 2018-03-27 DIAGNOSIS — A419 Sepsis, unspecified organism: Secondary | ICD-10-CM | POA: Diagnosis not present

## 2018-03-27 DIAGNOSIS — I959 Hypotension, unspecified: Secondary | ICD-10-CM | POA: Diagnosis not present

## 2018-03-27 DIAGNOSIS — G92 Toxic encephalopathy: Secondary | ICD-10-CM | POA: Diagnosis present

## 2018-03-27 DIAGNOSIS — D649 Anemia, unspecified: Secondary | ICD-10-CM | POA: Diagnosis present

## 2018-03-27 DIAGNOSIS — E872 Acidosis: Secondary | ICD-10-CM | POA: Diagnosis present

## 2018-03-27 DIAGNOSIS — J9602 Acute respiratory failure with hypercapnia: Secondary | ICD-10-CM | POA: Diagnosis present

## 2018-03-27 DIAGNOSIS — I159 Secondary hypertension, unspecified: Secondary | ICD-10-CM | POA: Diagnosis not present

## 2018-03-27 DIAGNOSIS — G894 Chronic pain syndrome: Secondary | ICD-10-CM | POA: Diagnosis present

## 2018-03-27 DIAGNOSIS — R34 Anuria and oliguria: Secondary | ICD-10-CM | POA: Diagnosis present

## 2018-03-27 DIAGNOSIS — A4151 Sepsis due to Escherichia coli [E. coli]: Principal | ICD-10-CM | POA: Diagnosis present

## 2018-03-27 DIAGNOSIS — Z1612 Extended spectrum beta lactamase (ESBL) resistance: Secondary | ICD-10-CM | POA: Diagnosis present

## 2018-03-27 DIAGNOSIS — Z79899 Other long term (current) drug therapy: Secondary | ICD-10-CM

## 2018-03-27 DIAGNOSIS — R4182 Altered mental status, unspecified: Secondary | ICD-10-CM | POA: Diagnosis not present

## 2018-03-27 DIAGNOSIS — R6520 Severe sepsis without septic shock: Secondary | ICD-10-CM | POA: Diagnosis present

## 2018-03-27 DIAGNOSIS — Z79891 Long term (current) use of opiate analgesic: Secondary | ICD-10-CM

## 2018-03-27 DIAGNOSIS — N179 Acute kidney failure, unspecified: Secondary | ICD-10-CM | POA: Diagnosis not present

## 2018-03-27 DIAGNOSIS — M25561 Pain in right knee: Secondary | ICD-10-CM | POA: Diagnosis present

## 2018-03-27 DIAGNOSIS — R05 Cough: Secondary | ICD-10-CM | POA: Diagnosis not present

## 2018-03-27 DIAGNOSIS — K429 Umbilical hernia without obstruction or gangrene: Secondary | ICD-10-CM | POA: Diagnosis not present

## 2018-03-27 DIAGNOSIS — M5136 Other intervertebral disc degeneration, lumbar region: Secondary | ICD-10-CM | POA: Diagnosis present

## 2018-03-27 DIAGNOSIS — N10 Acute tubulo-interstitial nephritis: Secondary | ICD-10-CM | POA: Diagnosis present

## 2018-03-27 DIAGNOSIS — E860 Dehydration: Secondary | ICD-10-CM | POA: Diagnosis present

## 2018-03-27 DIAGNOSIS — D631 Anemia in chronic kidney disease: Secondary | ICD-10-CM | POA: Diagnosis present

## 2018-03-27 DIAGNOSIS — F1120 Opioid dependence, uncomplicated: Secondary | ICD-10-CM | POA: Diagnosis present

## 2018-03-27 DIAGNOSIS — F319 Bipolar disorder, unspecified: Secondary | ICD-10-CM | POA: Diagnosis present

## 2018-03-27 DIAGNOSIS — F1910 Other psychoactive substance abuse, uncomplicated: Secondary | ICD-10-CM | POA: Diagnosis present

## 2018-03-27 DIAGNOSIS — R410 Disorientation, unspecified: Secondary | ICD-10-CM | POA: Diagnosis not present

## 2018-03-27 DIAGNOSIS — R0602 Shortness of breath: Secondary | ICD-10-CM

## 2018-03-27 DIAGNOSIS — F419 Anxiety disorder, unspecified: Secondary | ICD-10-CM | POA: Diagnosis present

## 2018-03-27 DIAGNOSIS — N12 Tubulo-interstitial nephritis, not specified as acute or chronic: Secondary | ICD-10-CM

## 2018-03-27 DIAGNOSIS — R404 Transient alteration of awareness: Secondary | ICD-10-CM | POA: Diagnosis not present

## 2018-03-27 DIAGNOSIS — N183 Chronic kidney disease, stage 3 (moderate): Secondary | ICD-10-CM | POA: Diagnosis present

## 2018-03-27 DIAGNOSIS — R0902 Hypoxemia: Secondary | ICD-10-CM | POA: Diagnosis not present

## 2018-03-27 DIAGNOSIS — Z833 Family history of diabetes mellitus: Secondary | ICD-10-CM

## 2018-03-27 DIAGNOSIS — M5030 Other cervical disc degeneration, unspecified cervical region: Secondary | ICD-10-CM | POA: Diagnosis present

## 2018-03-27 HISTORY — DX: Low back pain: M54.5

## 2018-03-27 HISTORY — DX: Other chronic pain: G89.29

## 2018-03-27 HISTORY — DX: Pure hypercholesterolemia, unspecified: E78.00

## 2018-03-27 HISTORY — DX: Cardiac murmur, unspecified: R01.1

## 2018-03-27 HISTORY — DX: Major depressive disorder, single episode, unspecified: F32.9

## 2018-03-27 HISTORY — DX: Depression, unspecified: F32.A

## 2018-03-27 HISTORY — DX: Low back pain, unspecified: M54.50

## 2018-03-27 LAB — CREATININE, SERUM
Creatinine, Ser: 8.74 mg/dL — ABNORMAL HIGH (ref 0.44–1.00)
GFR calc non Af Amer: 5 mL/min — ABNORMAL LOW (ref >60)
GFR, EST AFRICAN AMERICAN: 5 mL/min — AB (ref >60)

## 2018-03-27 LAB — COMPREHENSIVE METABOLIC PANEL
ALT: 254 U/L — ABNORMAL HIGH (ref 0–44)
AST: 575 U/L — ABNORMAL HIGH (ref 15–41)
Albumin: 2.6 g/dL — ABNORMAL LOW (ref 3.5–5.0)
Alkaline Phosphatase: 41 U/L (ref 38–126)
Anion gap: 17 — ABNORMAL HIGH (ref 5–15)
BUN: 62 mg/dL — ABNORMAL HIGH (ref 6–20)
CO2: 19 mmol/L — ABNORMAL LOW (ref 22–32)
Calcium: 8 mg/dL — ABNORMAL LOW (ref 8.9–10.3)
Chloride: 99 mmol/L (ref 98–111)
Creatinine, Ser: 9.8 mg/dL — ABNORMAL HIGH (ref 0.44–1.00)
GFR calc Af Amer: 5 mL/min — ABNORMAL LOW (ref >60)
GFR calc non Af Amer: 4 mL/min — ABNORMAL LOW (ref >60)
Glucose, Bld: 132 mg/dL — ABNORMAL HIGH (ref 70–99)
Potassium: 4.4 mmol/L (ref 3.5–5.1)
Sodium: 135 mmol/L (ref 135–145)
Total Bilirubin: 0.3 mg/dL (ref 0.3–1.2)
Total Protein: 6.1 g/dL — ABNORMAL LOW (ref 6.5–8.1)

## 2018-03-27 LAB — CBC
HCT: 31.8 % — ABNORMAL LOW (ref 36.0–46.0)
Hemoglobin: 10.4 g/dL — ABNORMAL LOW (ref 12.0–15.0)
MCH: 31.5 pg (ref 26.0–34.0)
MCHC: 32.7 g/dL (ref 30.0–36.0)
MCV: 96.4 fL (ref 80.0–100.0)
NRBC: 0 % (ref 0.0–0.2)
PLATELETS: 206 10*3/uL (ref 150–400)
RBC: 3.3 MIL/uL — ABNORMAL LOW (ref 3.87–5.11)
RDW: 12.9 % (ref 11.5–15.5)
WBC: 13.7 10*3/uL — AB (ref 4.0–10.5)

## 2018-03-27 LAB — PROTIME-INR
INR: 1.16
Prothrombin Time: 14.7 seconds (ref 11.4–15.2)

## 2018-03-27 LAB — CBC WITH DIFFERENTIAL/PLATELET
Band Neutrophils: 36 %
Basophils Absolute: 0 10*3/uL (ref 0.0–0.1)
Basophils Relative: 0 %
Blasts: 0 %
Eosinophils Absolute: 0.2 10*3/uL (ref 0.0–0.5)
Eosinophils Relative: 1 %
HCT: 37.4 % (ref 36.0–46.0)
Hemoglobin: 11.7 g/dL — ABNORMAL LOW (ref 12.0–15.0)
Lymphocytes Relative: 8 %
Lymphs Abs: 1.2 10*3/uL (ref 0.7–4.0)
MCH: 31.1 pg (ref 26.0–34.0)
MCHC: 31.3 g/dL (ref 30.0–36.0)
MCV: 99.5 fL (ref 80.0–100.0)
Metamyelocytes Relative: 0 %
Monocytes Absolute: 1.2 10*3/uL — ABNORMAL HIGH (ref 0.1–1.0)
Monocytes Relative: 8 %
Myelocytes: 0 %
Neutro Abs: 12.7 10*3/uL — ABNORMAL HIGH (ref 1.7–7.7)
Neutrophils Relative %: 47 %
Other: 0 %
Platelets: 223 10*3/uL (ref 150–400)
Promyelocytes Relative: 0 %
RBC: 3.76 MIL/uL — ABNORMAL LOW (ref 3.87–5.11)
RDW: 12.9 % (ref 11.5–15.5)
Smear Review: ADEQUATE
WBC: 15.3 10*3/uL — ABNORMAL HIGH (ref 4.0–10.5)
nRBC: 0 % (ref 0.0–0.2)
nRBC: 0 /100 WBC

## 2018-03-27 LAB — URINALYSIS, ROUTINE W REFLEX MICROSCOPIC
Bilirubin Urine: NEGATIVE
Glucose, UA: NEGATIVE mg/dL
Ketones, ur: NEGATIVE mg/dL
Nitrite: NEGATIVE
Protein, ur: 100 mg/dL — AB
Specific Gravity, Urine: 1.014 (ref 1.005–1.030)
WBC, UA: >50 WBC/hpf — ABNORMAL HIGH (ref 0–5)
pH: 5 (ref 5.0–8.0)

## 2018-03-27 LAB — I-STAT CG4 LACTIC ACID, ED
Lactic Acid, Venous: 3.21 mmol/L (ref 0.5–1.9)
Lactic Acid, Venous: 1.72 mmol/L (ref 0.5–1.9)

## 2018-03-27 LAB — SALICYLATE LEVEL: Salicylate Lvl: <7 mg/dL (ref 2.8–30.0)

## 2018-03-27 LAB — BLOOD GAS, ARTERIAL
Acid-base deficit: 8 mmol/L — ABNORMAL HIGH (ref 0.0–2.0)
BICARBONATE: 18 mmol/L — AB (ref 20.0–28.0)
DRAWN BY: 345601
O2 Content: 2 L/min
O2 SAT: 94 %
PATIENT TEMPERATURE: 98.7
pCO2 arterial: 43.4 mmHg (ref 32.0–48.0)
pH, Arterial: 7.243 — ABNORMAL LOW (ref 7.350–7.450)
pO2, Arterial: 76.8 mmHg — ABNORMAL LOW (ref 83.0–108.0)

## 2018-03-27 LAB — ACETAMINOPHEN LEVEL: Acetaminophen (Tylenol), Serum: <10 ug/mL — ABNORMAL LOW (ref 10–30)

## 2018-03-27 LAB — D-DIMER, QUANTITATIVE (NOT AT ARMC): D DIMER QUANT: 2.31 ug{FEU}/mL — AB (ref 0.00–0.50)

## 2018-03-27 LAB — TROPONIN I: TROPONIN I: 0.03 ng/mL — AB (ref <0.03)

## 2018-03-27 LAB — TSH: TSH: 3.195 u[IU]/mL (ref 0.350–4.500)

## 2018-03-27 LAB — PROCALCITONIN: Procalcitonin: 23.47 ng/mL

## 2018-03-27 LAB — APTT: aPTT: 34 seconds (ref 24–36)

## 2018-03-27 LAB — LACTIC ACID, PLASMA
LACTIC ACID, VENOUS: 1.4 mmol/L (ref 0.5–1.9)
Lactic Acid, Venous: 2.5 mmol/L (ref 0.5–1.9)
Lactic Acid, Venous: 1.6 mmol/L (ref 0.5–1.9)

## 2018-03-27 LAB — AMMONIA: AMMONIA: 23 umol/L (ref 9–35)

## 2018-03-27 MED ORDER — SODIUM CHLORIDE 0.9 % IV SOLN
INTRAVENOUS | Status: AC
  Administered 2018-03-27 – 2018-03-28 (×3): via INTRAVENOUS

## 2018-03-27 MED ORDER — SODIUM CHLORIDE 0.9 % IV SOLN: INTRAVENOUS | Status: DC

## 2018-03-27 MED ORDER — SODIUM CHLORIDE 0.9 % IV SOLN
2.0000 g | Freq: Once | INTRAVENOUS | Status: AC
  Administered 2018-03-27: 2 g via INTRAVENOUS
  Filled 2018-03-27: qty 2

## 2018-03-27 MED ORDER — NALOXONE HCL 2 MG/2ML IJ SOSY
PREFILLED_SYRINGE | INTRAMUSCULAR | Status: AC
  Filled 2018-03-27: qty 2

## 2018-03-27 MED ORDER — NALOXONE HCL 2 MG/2ML IJ SOSY
1.0000 mg | PREFILLED_SYRINGE | Freq: Once | INTRAMUSCULAR | Status: AC
  Administered 2018-03-27: 1 mg via INTRAVENOUS

## 2018-03-27 MED ORDER — NALOXONE HCL 0.4 MG/ML IJ SOLN: 0.4000 mg | Freq: Once | INTRAMUSCULAR | Status: DC

## 2018-03-27 MED ORDER — SODIUM CHLORIDE 0.9 % IV SOLN: 2.0000 g | Freq: Once | INTRAVENOUS | Status: DC

## 2018-03-27 MED ORDER — METRONIDAZOLE IN NACL 5-0.79 MG/ML-% IV SOLN
500.0000 mg | Freq: Three times a day (TID) | INTRAVENOUS | Status: DC
  Administered 2018-03-28 (×2): 500 mg via INTRAVENOUS
  Filled 2018-03-27 (×2): qty 100

## 2018-03-27 MED ORDER — SODIUM CHLORIDE 0.9 % IV SOLN
1.0000 g | INTRAVENOUS | Status: DC
  Filled 2018-03-27: qty 1

## 2018-03-27 MED ORDER — VANCOMYCIN HCL IN DEXTROSE 1-5 GM/200ML-% IV SOLN: 1000.0000 mg | Freq: Once | INTRAVENOUS | Status: DC

## 2018-03-27 MED ORDER — ENOXAPARIN SODIUM 30 MG/0.3ML ~~LOC~~ SOLN
30.0000 mg | SUBCUTANEOUS | Status: DC
  Administered 2018-03-27: 30 mg via SUBCUTANEOUS
  Filled 2018-03-27: qty 0.3

## 2018-03-27 MED ORDER — ACETAMINOPHEN 650 MG RE SUPP: 650.0000 mg | Freq: Four times a day (QID) | RECTAL | Status: DC | PRN

## 2018-03-27 MED ORDER — VANCOMYCIN HCL 10 G IV SOLR
2000.0000 mg | Freq: Once | INTRAVENOUS | Status: AC
  Administered 2018-03-27: 2000 mg via INTRAVENOUS
  Filled 2018-03-27: qty 2000

## 2018-03-27 MED ORDER — SODIUM CHLORIDE 0.9 % IV BOLUS
3000.0000 mL | Freq: Once | INTRAVENOUS | Status: AC
  Administered 2018-03-27: 3000 mL via INTRAVENOUS

## 2018-03-27 MED ORDER — ACETAMINOPHEN 325 MG PO TABS: 650.0000 mg | ORAL_TABLET | Freq: Four times a day (QID) | ORAL | Status: DC | PRN

## 2018-03-27 MED ORDER — VANCOMYCIN VARIABLE DOSE PER UNSTABLE RENAL FUNCTION (PHARMACIST DOSING): Status: DC

## 2018-03-27 MED ORDER — ORAL CARE MOUTH RINSE
15.0000 mL | Freq: Two times a day (BID) | OROMUCOSAL | Status: DC
  Administered 2018-03-28 – 2018-04-04 (×9): 15 mL via OROMUCOSAL

## 2018-03-27 MED ORDER — METRONIDAZOLE IN NACL 5-0.79 MG/ML-% IV SOLN
500.0000 mg | Freq: Once | INTRAVENOUS | Status: AC
  Administered 2018-03-27: 500 mg via INTRAVENOUS
  Filled 2018-03-27: qty 100

## 2018-03-27 NOTE — Progress Notes (Signed)
Pt arrived from ED. Pt's mother at bedside. Pt is A&Ox3. Pt lives at home with mother. Pt's skin is warm, dry and intact. Told pt to call for assistance before getting out of bed, pt stated understanding. Oriented pt to room. Pt placed on stepdown monitor #3. Will continue to monitor pt. Nelda MarseilleJenny Thacker, RN

## 2018-03-27 NOTE — H&P (Addendum)
History and Physical    Jeanne Leach ZOX:[id number] DOB: Jul 24, 1965 DOA: 03/27/2018  PCP: Alwyn PeaMartin, Tanya, MD  Patient coming from: Home.  Chief Complaint: Weakness and altered mental status.  HPI: Jeanne Leach is a 52 y.o. female with history of hypertension, chronic pain, anxiety, chronic kidney disease, chronic anemia was found to be weak and drowsy by patient's family.  As per the ER physician and the patient patient's family had come to check on her today to take her for dinner with her other family members when patient was found to be drowsy weak and unable to ambulate because of the weakness.  EMS was called patient was found to be hypoxic weak hypotensive was given fluid bolus and brought to the ER.  Patient states she has been bedbound since Friday almost 5 days now because of weakness.  Denies any nausea vomiting but as per the report patient had some vomitus around her face when EMS had come.  Per the report patient also had taken her oxycodone this morning.  Patient denies overdosing with any medications including her pain medication on her salicylates or Tylenol.  Patient states she has right knee pain which is chronic and has some difficulty moving the right knee.  Specifically denies chest pain or shortness of breath.  ED Course: In the ER patient was hypotensive with labs showing elevated lactate leukocytosis and patient was febrile.  Creatinine was markedly elevated from her baseline 2 years ago and presently it is around 9.  UA is compatible with UTI and also shows protein.  Chest x-ray unremarkable but CT renal stone study does not show any obstruction but shows bilateral pyelonephritis with stranding.  Also the CT scan done shows possibility of lung infection.  Patient was started on fluid bolus for sepsis following which blood pressure has been improving.  In addition patient is found to have elevated LFTs.  On exam patient abdomen appears benign patient has been hypoxic and was  initially placed on 6 L oxygen which was gradually weaned to 2 L.  On my exam patient is more responsive follows commands moves all extremities.  Appears nonfocal.  Initially patient also was given a dose of Narcan.  Review of Systems: As per HPI, rest all negative.   Past Medical History:  Diagnosis Date  . Anxiety   . Bipolar 1 disorder (HCC)   . Chronic back pain   . Degenerative disc disease, cervical   . Degenerative disc disease, cervical   . Degenerative disc disease, lumbar   . Hypertension     Past Surgical History:  Procedure Laterality Date  . CESAREAN SECTION    . LIPOMA EXCISION       reports that she has been smoking. She has been smoking about 1.00 pack per day. She has never used smokeless tobacco. She reports that she has current or past drug history. Drug: Oxycodone. She reports that she does not drink alcohol.  No Known Allergies  Family History  Problem Relation Age of Onset  . Hypertension Unknown   . Diabetes Mellitus II Mother     Prior to Admission medications   Medication Sig Start Date End Date Taking? Authorizing Provider  ALPRAZolam Prudy Feeler(XANAX) 1 MG tablet Take 1 mg by mouth 3 (three) times daily. 03/09/18  Yes [provider]  valsartan (DIOVAN) 80 MG tablet Take 80 mg by mouth daily.   Yes [provider]  alprazolam Prudy Feeler(XANAX) 2 MG tablet Take 0.5 tablets (1 mg total) by mouth  3 (three) times daily as needed for sleep or anxiety. Patient not taking: Reported on 03/27/2018 08/09/14   Edsel Petrin, DO  Oxycodone HCl 20 MG TABS Use only 1/2 tab every 6 hours as needed Patient not taking: Reported on 03/27/2018 11/06/14   Auburn Bilberry, MD  sulfamethoxazole-trimethoprim (BACTRIM DS,SEPTRA DS) 800-160 MG tablet Take 2 tablets by mouth 2 (two) times daily. Patient not taking: Reported on 10/04/2015 05/16/15   Karmen Stabs Charlesetta Ivory, PA-C    Physical Exam: Vitals:   03/27/18 1800 03/27/18 1830 03/27/18 1900 03/27/18 2050  BP: 113/75  119/77 112/72 (!) 134/119  Pulse:    90  Resp: (!) 22 17 17 14   Temp:      TempSrc:      SpO2:    99%  Weight:      Height:          Constitutional: Moderately built and nourished. Vitals:   03/27/18 1800 03/27/18 1830 03/27/18 1900 03/27/18 2050  BP: 113/75 119/77 112/72 (!) 134/119  Pulse:    90  Resp: (!) 22 17 17 14   Temp:      TempSrc:      SpO2:    99%  Weight:      Height:       Eyes: Anicteric no pallor. ENMT: No discharge from the ears eyes nose or mouth. Neck: No neck rigidity no mass felt. Respiratory: No rhonchi or crepitations. Cardiovascular: S1-S2 heard no murmurs appreciated. Abdomen: Soft nontender bowel sounds present. Musculoskeletal: No edema.  No joint effusion.  Has pain in right knee which is been chronic. Skin: No rash. Neurologic: Patient is mildly drowsy but oriented to time place and person moves all extremities some difficulty moving right lower extremity due to right knee pain. Psychiatric: Mildly drowsy otherwise oriented to time place and person.   Labs on Admission: I have personally reviewed following labs and imaging studies  CBC: Recent Labs  Lab 03/27/18 1654  WBC 15.3*  NEUTROABS 12.7*  HGB 11.7*  HCT 37.4  MCV 99.5  PLT 223   Basic Metabolic Panel: Recent Labs  Lab 03/27/18 1654  NA 135  K 4.4  CL 99  CO2 19*  GLUCOSE 132*  BUN 62*  CREATININE 9.80*  CALCIUM 8.0*   GFR: Estimated Creatinine Clearance: 8.2 mL/min (A) (by C-G formula based on SCr of 9.8 mg/dL (H)). Liver Function Tests: Recent Labs  Lab 03/27/18 1654  AST 575*  ALT 254*  ALKPHOS 41  BILITOT 0.3  PROT 6.1*  ALBUMIN 2.6*   No results for input(s): LIPASE, AMYLASE in the last 168 hours. No results for input(s): AMMONIA in the last 168 hours. Coagulation Profile: No results for input(s): INR, PROTIME in the last 168 hours. Cardiac Enzymes: No results for input(s): CKTOTAL, CKMB, CKMBINDEX, TROPONINI in the last 168 hours. BNP (last 3  results) No results for input(s): PROBNP in the last 8760 hours. HbA1C: No results for input(s): HGBA1C in the last 72 hours. CBG: No results for input(s): GLUCAP in the last 168 hours. Lipid Profile: No results for input(s): CHOL, HDL, LDLCALC, TRIG, CHOLHDL, LDLDIRECT in the last 72 hours. Thyroid Function Tests: No results for input(s): TSH, T4TOTAL, FREET4, T3FREE, THYROIDAB in the last 72 hours. Anemia Panel: No results for input(s): VITAMINB12, FOLATE, FERRITIN, TIBC, IRON, RETICCTPCT in the last 72 hours. Urine analysis:    Component Value Date/Time   COLORURINE YELLOW 03/27/2018 1654   APPEARANCEUR TURBID (A) 03/27/2018 1654   APPEARANCEUR Cloudy 05/21/2013  1205   LABSPEC 1.014 03/27/2018 1654   LABSPEC 1.023 05/21/2013 1205   PHURINE 5.0 03/27/2018 1654   GLUCOSEU NEGATIVE 03/27/2018 1654   GLUCOSEU Negative 05/21/2013 1205   HGBUR LARGE (A) 03/27/2018 1654   BILIRUBINUR NEGATIVE 03/27/2018 1654   BILIRUBINUR Negative 05/21/2013 1205   KETONESUR NEGATIVE 03/27/2018 1654   PROTEINUR 100 (A) 03/27/2018 1654   UROBILINOGEN 0.2 08/17/2014 1224   NITRITE NEGATIVE 03/27/2018 1654   LEUKOCYTESUR MODERATE (A) 03/27/2018 1654   LEUKOCYTESUR Negative 05/21/2013 1205   Sepsis Labs: @LABRCNTIP (procalcitonin:4,lacticidven:4) )No results found for this or any previous visit (from the past 240 hour(s)).   Radiological Exams on Admission: Dg Chest Portable 1 View  Result Date: 03/27/2018 CLINICAL DATA:  Severe cough hypoxia EXAM: PORTABLE CHEST 1 VIEW COMPARISON:  CT 10/04/2015, radiograph 11/03/2014 FINDINGS: No focal consolidation or pleural effusion. Stable cardiomediastinal silhouette. No pneumothorax. IMPRESSION: No active disease. Electronically Signed   By: Jasmine Pang M.D.   On: 03/27/2018 17:54   Ct Renal Stone Study  Result Date: 03/27/2018 CLINICAL DATA:  Initial evaluation for acute bilateral flank pain. EXAM: CT ABDOMEN AND PELVIS WITHOUT CONTRAST TECHNIQUE:  Multidetector CT imaging of the abdomen and pelvis was performed following the standard protocol without IV contrast. COMPARISON:  Prior CT from 10/04/2015. FINDINGS: Lower chest: Dependent atelectatic changes noted within the visualized lung bases. Few additional scattered tree-in-bud nodular densities noted, nonspecific, but suggestive of possible endobronchial infection/small airways disease. Hepatobiliary: Liver demonstrates a normal unenhanced appearance. Gallbladder within normal limits. No biliary dilatation. Pancreas: Mild diffuse fatty infiltration the pancreas noted. Pancreas otherwise unremarkable without acute inflammatory changes. Spleen: Spleen within normal limits. Adrenals/Urinary Tract: Adrenal glands are normal. Kidneys equal in size. No nephrolithiasis or hydronephrosis. No radiopaque calculi seen along the course of either renal collecting system. No hydroureter. Subtle hazy perinephric fat stranding seen about the kidneys bilaterally, right slightly greater than left, raising the possibility for possible upper urinary tract infection. No perinephric collections seen on this noncontrast examination. No layering stones within the bladder lumen. Mild circumferential wall thickening about the bladder. Stomach/Bowel: Stomach within normal limits. No evidence for bowel obstruction. Normal appendix. No acute inflammatory changes seen about the bowels. Vascular/Lymphatic: Mild aorto bi-iliac atherosclerotic disease. No aneurysm. No adenopathy. Reproductive: Uterus and ovaries within normal limits. Other: No free air or fluid. Small fat containing paraumbilical hernia noted. Musculoskeletal: No acute osseous abnormality. No worrisome lytic or blastic osseous lesions. Mild multilevel facet arthropathy noted within the lumbar spine. IMPRESSION: 1. No CT evidence for nephrolithiasis or obstructive uropathy. 2. Mild hazy perinephric fat stranding about the kidneys bilaterally, right greater than left.  Findings are nonspecific, but could reflect sequelae of acute upper urinary tract infection/pyelonephritis. Correlation with urinalysis recommended. Mild circumferential bladder wall thickening may be related to incomplete distension and/or concomitant cystitis. 3. Patchy multifocal nodular tree-in-bud opacities within the visualized lung bases, suspicious for acute endobronchial infection/small airways disease. 4. No other acute abnormality within the abdomen and pelvis. Electronically Signed   By: Rise Mu M.D.   On: 03/27/2018 20:09    EKG: Independently reviewed.  Normal sinus rhythm.  Assessment/Plan Principal Problem:   Severe sepsis (HCC) Active Problems:   Hypertension   Bipolar 1 disorder (HCC)   AKI (acute kidney injury) (HCC)   Opioid dependence (HCC)   Acute respiratory failure with hypoxia (HCC)   Normocytic normochromic anemia   Pyelonephritis   Sepsis (HCC)    1. Severe sepsis likely source is urinary tract infection with  pyelonephritis there is also some signs of infiltrates in the lung through the CAT scan done for renal study.  Patient has been placed on empiric antibiotics continue with aggressive hydration patient has already received 3 L fluid bolus.  Follow blood cultures urine cultures lactate procalcitonin levels. 2. Acute renal failure cause not clear likely could be prerenal from hypotension from sepsis patient also has been using her ARB which could be contributing to ATN also.  CT scan does not show any obstruction.  Urine does not shows hyaline casts and protein.  There is a lot of RBCs.  If creatinine does not improve will need to get nephrology consult.  Continue with hydration closely follow intake output metabolic panel check FENa SPEP CK levels. 3. Acute respiratory failure with hypoxia -initially patient required 6 L of oxygen which improved and was weaned to 2 L.  Will check ABG. CT renal study done shows infiltrates in the lung.  Will be from  aspiration, will cover empirically for now check urine for Legionella strep antigen influenza PCR and also d-dimer. 4. Elevated LFTs could be from sepsis or hypotensive episodes.  Check Tylenol levels with INR acute hepatitis panel continue with hydration.  If CK levels are elevated could be from rhabdomyolysis.  Patient denies drinking any alcohol. 5. Chronic right knee pain from previous knee surgery does not show any infection on exam. 6. Acute encephalopathy likely could be from chronic pain medications and presently have renal failure.  Holding of any pain only medications at this time.  Check ABG ammonia levels urine drug screen CT head. 7. Anemia appears to be chronic follow CBC. 8. History of anxiety and bipolar disorder.   DVT prophylaxis: Initially started on Lovenox but will hold off and place patient on SCDs since patient may require renal biopsy if creatinine does not improve. Code Status: Full code. Family Communication: Discussed with patient. Disposition Plan: Home. Consults called: None. Admission status: Inpatient.   Eduard Clos MD [facility] Pager [phone].  If 7PM-7AM, please contact night-coverage [email or web address] Password [facility]  03/27/2018, 9:38 PM

## 2018-03-27 NOTE — Progress Notes (Signed)
CRITICAL VALUE ALERT  Critical Value:  Troponin 0.03  Date & Time Notied:  03/27/18 2352  Provider Notified: Dr. Toniann FailKakrakandy  Orders Received/Actions taken: No new orders given.

## 2018-03-27 NOTE — ED Provider Notes (Signed)
[facility] Provider Note   CSN: [id number] Arrival date & time: 03/27/18  1636     History   Chief Complaint Chief Complaint  Patient presents with  . Respiratory Distress    HPI Jeanne Leach is a 52 y.o. female.  HPI   52yF with altered mental status. Per EMS, pt has been in her bed since Friday. Today she tried to get up and noted to be very confused by family. "Talking out of her head." Speech slurred. Couldn't walk. On EMS arrival she was hypoxic in 70s on RA. Blood pressures 60-80 systolic. Glucose 204. EMS reports she told them she took two oxycodone today. She says she takes this for back pain. She denies pain elsewhere. She is not a reliable historian (She tells me she hasn't been vomiting although she has dried vomitus on face/neck).   Past Medical History:  Diagnosis Date  . Anxiety   . Bipolar 1 disorder (HCC)   . Chronic back pain   . Degenerative disc disease, cervical   . Degenerative disc disease, cervical   . Degenerative disc disease, lumbar   . Hypertension     Patient Active Problem List   Diagnosis Date Noted  . Opioid dependence (HCC) 11/04/2014  . Panic disorder 11/04/2014  . 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 Opioid overdose (HCC) 11/04/2014  . Opioid use with opioid-induced mood disorder (HCC) 11/04/2014  . Overdose 11/03/2014  . Sepsis due to urinary tract infection (HCC) 08/08/2014  . Septic shock (HCC) 08/08/2014  . Diabetes mellitus without complication (HCC) 08/07/2014  . Fall 08/07/2014  . Hypertension   . Degenerative disc disease, cervical   . Bipolar 1 disorder (HCC)   . AKI (acute kidney injury) [facility](HCC)     Past Surgical History:  Procedure Laterality Date  . CESAREAN SECTION    . LIPOMA EXCISION       OB History   None      Home Medications    Prior to Admission medications   Medication Sig Start Date End Date Taking? Authorizing Provider  alprazolam Prudy Feeler(XANAX) 2 MG tablet  Take 0.5 tablets (1 mg total) by mouth 3 (three) times daily as needed for sleep or anxiety. Patient taking differently: Take 2 mg by mouth every 6 (six) hours as needed for anxiety.  08/09/14   Edsel PetrinMikhail, Maryann, DO  Oxycodone HCl 20 MG TABS Use only 1/2 tab every 6 hours as needed 11/06/14   Auburn BilberryPatel, Shreyang, MD  sulfamethoxazole-trimethoprim (BACTRIM DS,SEPTRA DS) 800-160 MG tablet Take 2 tablets by mouth 2 (two) times daily. Patient not taking: Reported on 10/04/2015 05/16/15   Menshew, Charlesetta IvoryJenise V Bacon, PA-C    Family History Family History  Problem Relation Age of Onset  . Hypertension Unknown     Social History Social History   Tobacco Use  . Smoking status: Current Every Day Smoker    Packs/day: 1.00  . Smokeless tobacco: Never Used  Substance Use Topics  . Alcohol use: No    Alcohol/week: 0.0 standard drinks  . Drug use: Yes    Types: Oxycodone    Comment: Prescribed opioids, benzo (Xanax), and amph (Vyvanse)     Allergies   Patient has no known allergies.   Review of Systems Review of Systems  Level 5 caveat because of confusion.   Physical Exam Updated Vital Signs BP (!) 73/46   Pulse 87   Temp 98.5 F (36.9 C) (Oral)   Resp (!) 8   Ht  5\' 7"  (1.702 m)   Wt 99.8 kg   SpO2 100%   BMI 34.46 kg/m   Physical Exam  Constitutional: She appears distressed.  Dried vomit on face/neck.  HENT:  Head: Normocephalic and atraumatic.  Eyes: Pupils are equal, round, and reactive to light. Conjunctivae are normal. Right eye exhibits no discharge. Left eye exhibits no discharge.  Pulse constricted.  Equal.  Neck: Neck supple.  Cardiovascular: Normal rate, regular rhythm and normal heart sounds. Exam reveals no gallop and no friction rub.  No murmur heard. Pulmonary/Chest: She is in respiratory distress.  Tachypnea.  Right-sided rhonchi.  Abdominal: Soft. She exhibits no distension. There is no tenderness.  Musculoskeletal: She exhibits no edema or tenderness.    Neurological:  Drowsy.  Speech is slurred but understandable.  Follows some simple commands.  No focal motor deficits noted.  Skin: Skin is warm and dry.  Nursing note and vitals reviewed.    ED Treatments / Results  Labs (all labs ordered are listed, but only abnormal results are displayed) Labs Reviewed  CBC WITH DIFFERENTIAL/PLATELET - Abnormal; Notable for the following components:      Result Value   WBC 15.3 (*)    RBC 3.76 (*)    Hemoglobin 11.7 (*)    Neutro Abs 12.7 (*)    Monocytes Absolute 1.2 (*)    All other components within normal limits  COMPREHENSIVE METABOLIC PANEL - Abnormal; Notable for the following components:   CO2 19 (*)    Glucose, Bld 132 (*)    BUN 62 (*)    Creatinine, Ser 9.80 (*)    Calcium 8.0 (*)    Total Protein 6.1 (*)    Albumin 2.6 (*)    AST 575 (*)    ALT 254 (*)    GFR calc non Af Amer 4 (*)    GFR calc Af Amer 5 (*)    Anion gap 17 (*)    All other components within normal limits  URINALYSIS, ROUTINE W REFLEX MICROSCOPIC - Abnormal; Notable for the following components:   APPearance TURBID (*)    Hgb urine dipstick LARGE (*)    Protein, ur 100 (*)    Leukocytes, UA MODERATE (*)    WBC, UA >50 (*)    Bacteria, UA FEW (*)    All other components within normal limits  LACTIC ACID, PLASMA - Abnormal; Notable for the following components:   Lactic Acid, Venous 2.5 (*)    All other components within normal limits  I-STAT CG4 LACTIC ACID, ED - Abnormal; Notable for the following components:   Lactic Acid, Venous 3.21 (*)    All other components within normal limits  CULTURE, BLOOD (ROUTINE X 2)  CULTURE, BLOOD (ROUTINE X 2)  URINE CULTURE  LACTIC ACID, PLASMA  PATHOLOGIST SMEAR REVIEW  I-STAT CG4 LACTIC ACID, ED    EKG EKG Interpretation  Date/Time:  Tuesday March 27 2018 16:44:31 EST Ventricular Rate:  87 PR Interval:    QRS Duration: 90 QT Interval:  366 QTC Calculation: 441 R Axis:   25 Text Interpretation:   Sinus rhythm Confirmed by Raeford Razor [phone]) on 03/27/2018 5:06:06 PM   Radiology Dg Chest Portable 1 View  Result Date: 03/27/2018 CLINICAL DATA:  Severe cough hypoxia EXAM: PORTABLE CHEST 1 VIEW COMPARISON:  CT 10/04/2015, radiograph 11/03/2014 FINDINGS: No focal consolidation or pleural effusion. Stable cardiomediastinal silhouette. No pneumothorax. IMPRESSION: No active disease. Electronically Signed   By: Jasmine Pang M.D.   On:  03/27/2018 17:54   Ct Renal Stone Study  Result Date: 03/27/2018 CLINICAL DATA:  Initial evaluation for acute bilateral flank pain. EXAM: CT ABDOMEN AND PELVIS WITHOUT CONTRAST TECHNIQUE: Multidetector CT imaging of the abdomen and pelvis was performed following the standard protocol without IV contrast. COMPARISON:  Prior CT from 10/04/2015. FINDINGS: Lower chest: Dependent atelectatic changes noted within the visualized lung bases. Few additional scattered tree-in-bud nodular densities noted, nonspecific, but suggestive of possible endobronchial infection/small airways disease. Hepatobiliary: Liver demonstrates a normal unenhanced appearance. Gallbladder within normal limits. No biliary dilatation. Pancreas: Mild diffuse fatty infiltration the pancreas noted. Pancreas otherwise unremarkable without acute inflammatory changes. Spleen: Spleen within normal limits. Adrenals/Urinary Tract: Adrenal glands are normal. Kidneys equal in size. No nephrolithiasis or hydronephrosis. No radiopaque calculi seen along the course of either renal collecting system. No hydroureter. Subtle hazy perinephric fat stranding seen about the kidneys bilaterally, right slightly greater than left, raising the possibility for possible upper urinary tract infection. No perinephric collections seen on this noncontrast examination. No layering stones within the bladder lumen. Mild circumferential wall thickening about the bladder. Stomach/Bowel: Stomach within normal limits. No evidence for bowel  obstruction. Normal appendix. No acute inflammatory changes seen about the bowels. Vascular/Lymphatic: Mild aorto bi-iliac atherosclerotic disease. No aneurysm. No adenopathy. Reproductive: Uterus and ovaries within normal limits. Other: No free air or fluid. Small fat containing paraumbilical hernia noted. Musculoskeletal: No acute osseous abnormality. No worrisome lytic or blastic osseous lesions. Mild multilevel facet arthropathy noted within the lumbar spine. IMPRESSION: 1. No CT evidence for nephrolithiasis or obstructive uropathy. 2. Mild hazy perinephric fat stranding about the kidneys bilaterally, right greater than left. Findings are nonspecific, but could reflect sequelae of acute upper urinary tract infection/pyelonephritis. Correlation with urinalysis recommended. Mild circumferential bladder wall thickening may be related to incomplete distension and/or concomitant cystitis. 3. Patchy multifocal nodular tree-in-bud opacities within the visualized lung bases, suspicious for acute endobronchial infection/small airways disease. 4. No other acute abnormality within the abdomen and pelvis. Electronically Signed   By: Rise Mu M.D.   On: 03/27/2018 20:09    Procedures Procedures (including critical care time)  CRITICAL CARE Performed by: Raeford Razor Total critical care time: 35 minutes Critical care time was exclusive of separately billable procedures and treating other patients. Critical care was necessary to treat or prevent imminent or life-threatening deterioration. Critical care was time spent personally by me on the following activities: development of treatment plan with patient and/or surrogate as well as nursing, discussions with consultants, evaluation of patient's response to treatment, examination of patient, obtaining history from patient or surrogate, ordering and performing treatments and interventions, ordering and review of laboratory studies, ordering and review of  radiographic studies, pulse oximetry and re-evaluation of patient's condition.   Medications Ordered in ED Medications  vancomycin (VANCOCIN) 2,000 mg in sodium chloride 0.9 % 500 mL IVPB (2,000 mg Intravenous New Bag/Given 03/27/18 1808)  naloxone [facility]) injection 1 mg (1 mg Intravenous Given 03/27/18 1647)  ceFEPIme (MAXIPIME) 2 g in sodium chloride 0.9 % 100 mL IVPB (0 g Intravenous Stopped 03/27/18 1804)  sodium chloride 0.9 % bolus 3,000 mL (0 mLs Intravenous Stopped 03/27/18 1755)  metroNIDAZOLE (FLAGYL) IVPB 500 mg (0 mg Intravenous Stopped 03/27/18 1856)     Initial Impression / Assessment and Plan / ED Course  I have reviewed the triage vital signs and the nursing notes.  Pertinent labs & imaging results that were available during my care of the patient were reviewed by me  and considered in my medical decision making (see chart for details).     52 year old female with decreased responsiveness.  Clinical current concern for possible sepsis.  Empiric antibiotics ordered.  She had obviously vomited, however was hypoxic and had right-sided rhonchi.  Flagyl was added for possible aspiration pneumonia.  Hypotensive which has been responding to IV fluids.  She is afebrile but has leukocytosis.  Urinalysis is consistent with a urinary tract infection.  Renal function noted.  Significant change from her apparent baseline.  Will CT to further evaluate for possible obstructive uropathy.  Will place a Foley catheter to monitor her eyes nose.  She was initially hypoxic for EMS in the 70s and placed on nonrebreather.  She has been weaned to 6 L via nasal cannula.  LFTs noted.  Shock liver from hypotension?  Final Clinical Impressions(s) / ED Diagnoses   Final diagnoses:  Severe sepsis (HCC)  Pyelonephritis  AKI (acute kidney injury) [facility])    ED Discharge Orders    None       Raeford Razor, MD 03/27/18 2104

## 2018-03-27 NOTE — ED Triage Notes (Signed)
Pt is from home and having issues with breathing and severe cough. Pt has congestion when she coughs. Pt was at 77% on room air upon ems arrival. Possible pneumonia. Pt was alert oriented.placed on 15 L with non rebreather.

## 2018-03-27 NOTE — ED Notes (Signed)
Patient transported to CT 

## 2018-03-27 NOTE — Progress Notes (Signed)
Pharmacy Antibiotic Note  Jeanne HinesDeborah Leach is a 52 y.o. female admitted on 03/27/2018 with sepsis.  Pharmacy has been consulted for vanc/cefepime dosing.  Pt presented with AMS and signs of sepsis. Empiric abx ordered with vanc/cefepime/flagyl. She got the first dose of each in the ED. She is also in ARF with scr of 9.8. Therefore, vanc should lasts a while with this renal function.   Plan: Random vanc level in 2 days Cefepime 1g IV q24   Height: 5\' 5"  (165.1 cm) Weight: 220 lb 14.4 oz (100.2 kg) IBW/kg (Calculated) : 57  Temp (24hrs), Avg:98.6 F (37 C), Min:98.5 F (36.9 C), Max:98.7 F (37.1 C)  Recent Labs  Lab 03/27/18 1654 03/27/18 1731 03/27/18 1946 03/27/18 2011  WBC 15.3*  --   --   --   CREATININE 9.80*  --   --   --   LATICACIDVEN 2.5* 3.21* 1.6 1.72    Estimated Creatinine Clearance: 8 mL/min (A) (by C-G formula based on SCr of 9.8 mg/dL (H)).    No Known Allergies  Antimicrobials this admission: 11/26 vanc>> 11/26 cefepime>> 11/26 flagyl>>  Dose adjustments this admission:   Microbiology results: 11/26 urine>> 11/26 blood>>  Jeanne SouthwardMinh , PharmD, [place]ID[place], AAHIVP, CPP Infectious Disease Pharmacist 03/27/2018 9:53 PM

## 2018-03-27 NOTE — Progress Notes (Addendum)
Notified Kakrakandy that pt's ABG results: PH: 7.243, pC02 43.4 and bicarb: 18. MD stated it is ok, no new orders given. Will continue to monitor pt. Nelda MarseilleJenny Thacker, RN

## 2018-03-28 ENCOUNTER — Inpatient Hospital Stay (HOSPITAL_COMMUNITY): Payer: Medicare Other

## 2018-03-28 ENCOUNTER — Encounter (HOSPITAL_COMMUNITY): Payer: Self-pay

## 2018-03-28 LAB — CBC WITH DIFFERENTIAL/PLATELET
Abs Immature Granulocytes: 0.07 10*3/uL (ref 0.00–0.07)
BASOS ABS: 0 10*3/uL (ref 0.0–0.1)
BASOS PCT: 0 %
EOS ABS: 0 10*3/uL (ref 0.0–0.5)
EOS PCT: 0 %
HCT: 33.7 % — ABNORMAL LOW (ref 36.0–46.0)
Hemoglobin: 11.1 g/dL — ABNORMAL LOW (ref 12.0–15.0)
IMMATURE GRANULOCYTES: 1 %
Lymphocytes Relative: 11 %
Lymphs Abs: 1.6 10*3/uL (ref 0.7–4.0)
MCH: 32.3 pg (ref 26.0–34.0)
MCHC: 32.9 g/dL (ref 30.0–36.0)
MCV: 98 fL (ref 80.0–100.0)
Monocytes Absolute: 1.2 10*3/uL — ABNORMAL HIGH (ref 0.1–1.0)
Monocytes Relative: 8 %
NEUTROS PCT: 80 %
NRBC: 0 % (ref 0.0–0.2)
Neutro Abs: 11.1 10*3/uL — ABNORMAL HIGH (ref 1.7–7.7)
PLATELETS: 220 10*3/uL (ref 150–400)
RBC: 3.44 MIL/uL — ABNORMAL LOW (ref 3.87–5.11)
RDW: 13.1 % (ref 11.5–15.5)
WBC: 14 10*3/uL — ABNORMAL HIGH (ref 4.0–10.5)

## 2018-03-28 LAB — COMPREHENSIVE METABOLIC PANEL
ALBUMIN: 2.5 g/dL — AB (ref 3.5–5.0)
ALT: 258 U/L — ABNORMAL HIGH (ref 0–44)
ANION GAP: 14 (ref 5–15)
AST: 599 U/L — AB (ref 15–41)
Alkaline Phosphatase: 38 U/L (ref 38–126)
BUN: 61 mg/dL — AB (ref 6–20)
CHLORIDE: 104 mmol/L (ref 98–111)
CO2: 17 mmol/L — ABNORMAL LOW (ref 22–32)
Calcium: 7.6 mg/dL — ABNORMAL LOW (ref 8.9–10.3)
Creatinine, Ser: 8.74 mg/dL — ABNORMAL HIGH (ref 0.44–1.00)
GFR calc Af Amer: 5 mL/min — ABNORMAL LOW (ref >60)
GFR calc non Af Amer: 5 mL/min — ABNORMAL LOW (ref >60)
Glucose, Bld: 132 mg/dL — ABNORMAL HIGH (ref 70–99)
POTASSIUM: 4.3 mmol/L (ref 3.5–5.1)
Sodium: 135 mmol/L (ref 135–145)
Total Bilirubin: 0.5 mg/dL (ref 0.3–1.2)
Total Protein: 6.3 g/dL — ABNORMAL LOW (ref 6.5–8.1)

## 2018-03-28 LAB — PROTIME-INR
INR: 1.21
PROTHROMBIN TIME: 15.2 s (ref 11.4–15.2)

## 2018-03-28 LAB — RAPID URINE DRUG SCREEN, HOSP PERFORMED
Amphetamines: NOT DETECTED
Barbiturates: NOT DETECTED
Benzodiazepines: POSITIVE — AB
Cocaine: NOT DETECTED
OPIATES: NOT DETECTED
Tetrahydrocannabinol: NOT DETECTED

## 2018-03-28 LAB — BASIC METABOLIC PANEL
ANION GAP: 12 (ref 5–15)
BUN: 60 mg/dL — ABNORMAL HIGH (ref 6–20)
CALCIUM: 7.2 mg/dL — AB (ref 8.9–10.3)
CHLORIDE: 108 mmol/L (ref 98–111)
CO2: 15 mmol/L — AB (ref 22–32)
CREATININE: 8.48 mg/dL — AB (ref 0.44–1.00)
GFR calc Af Amer: 6 mL/min — ABNORMAL LOW (ref >60)
GFR, EST NON AFRICAN AMERICAN: 5 mL/min — AB (ref >60)
Glucose, Bld: 112 mg/dL — ABNORMAL HIGH (ref 70–99)
Potassium: 3.8 mmol/L (ref 3.5–5.1)
SODIUM: 135 mmol/L (ref 135–145)

## 2018-03-28 LAB — BLOOD GAS, ARTERIAL
ACID-BASE DEFICIT: 9.7 mmol/L — AB (ref 0.0–2.0)
BICARBONATE: 16.3 mmol/L — AB (ref 20.0–28.0)
DRAWN BY: 519031
Delivery systems: POSITIVE
Expiratory PAP: 6
FIO2: 30
Inspiratory PAP: 12
O2 Saturation: 97.1 %
PH ART: 7.23 — AB (ref 7.350–7.450)
Patient temperature: 100.3
pCO2 arterial: 41 mmHg (ref 32.0–48.0)
pO2, Arterial: 98.9 mmHg (ref 83.0–108.0)

## 2018-03-28 LAB — RETICULOCYTES
IMMATURE RETIC FRACT: 17.9 % — AB (ref 2.3–15.9)
RBC.: 2.95 MIL/uL — AB (ref 3.87–5.11)
RETIC COUNT ABSOLUTE: 38.6 10*3/uL (ref 19.0–186.0)
Retic Ct Pct: 1.3 % (ref 0.4–3.1)

## 2018-03-28 LAB — VITAMIN B12: Vitamin B-12: 207 pg/mL (ref 180–914)

## 2018-03-28 LAB — HEPATIC FUNCTION PANEL
ALT: 210 U/L — AB (ref 0–44)
AST: 449 U/L — AB (ref 15–41)
Albumin: 2.1 g/dL — ABNORMAL LOW (ref 3.5–5.0)
Alkaline Phosphatase: 33 U/L — ABNORMAL LOW (ref 38–126)
BILIRUBIN DIRECT: 0.1 mg/dL (ref 0.0–0.2)
BILIRUBIN INDIRECT: 0.4 mg/dL (ref 0.3–0.9)
BILIRUBIN TOTAL: 0.5 mg/dL (ref 0.3–1.2)
Total Protein: 5.3 g/dL — ABNORMAL LOW (ref 6.5–8.1)

## 2018-03-28 LAB — HIV ANTIBODY (ROUTINE TESTING W REFLEX): HIV SCREEN 4TH GENERATION: NONREACTIVE

## 2018-03-28 LAB — IRON AND TIBC
Iron: 17 ug/dL — ABNORMAL LOW (ref 28–170)
Saturation Ratios: 11 % (ref 10.4–31.8)
TIBC: 151 ug/dL — ABNORMAL LOW (ref 250–450)
UIBC: 134 ug/dL

## 2018-03-28 LAB — CREATININE, URINE, RANDOM: Creatinine, Urine: 191.49 mg/dL

## 2018-03-28 LAB — TROPONIN I
Troponin I: 0.03 ng/mL (ref <0.03)
Troponin I: 0.03 ng/mL (ref <0.03)

## 2018-03-28 LAB — PATHOLOGIST SMEAR REVIEW

## 2018-03-28 LAB — INFLUENZA PANEL BY PCR (TYPE A & B)
Influenza A By PCR: NEGATIVE
Influenza B By PCR: NEGATIVE

## 2018-03-28 LAB — LACTIC ACID, PLASMA: Lactic Acid, Venous: 1.3 mmol/L (ref 0.5–1.9)

## 2018-03-28 LAB — STREP PNEUMONIAE URINARY ANTIGEN: Strep Pneumo Urinary Antigen: NEGATIVE

## 2018-03-28 LAB — CK
CK TOTAL: 33449 U/L — AB (ref 38–234)
Total CK: 23560 U/L — ABNORMAL HIGH (ref 38–234)

## 2018-03-28 LAB — FOLATE: Folate: 9.9 ng/mL (ref >5.9)

## 2018-03-28 LAB — FERRITIN: FERRITIN: 418 ng/mL — AB (ref 11–307)

## 2018-03-28 LAB — SODIUM, URINE, RANDOM: Sodium, Ur: 59 mmol/L

## 2018-03-28 LAB — MRSA PCR SCREENING: MRSA BY PCR: NEGATIVE

## 2018-03-28 MED ORDER — SODIUM CHLORIDE 0.9 % IV SOLN
100.0000 mg | Freq: Two times a day (BID) | INTRAVENOUS | Status: DC
  Administered 2018-03-28 – 2018-04-01 (×8): 100 mg via INTRAVENOUS
  Filled 2018-03-28 (×9): qty 100

## 2018-03-28 MED ORDER — SODIUM BICARBONATE 8.4 % IV SOLN
INTRAVENOUS | Status: AC
  Administered 2018-03-28 – 2018-03-29 (×2): via INTRAVENOUS
  Filled 2018-03-28 (×3): qty 150

## 2018-03-28 MED ORDER — TECHNETIUM TO 99M ALBUMIN AGGREGATED
4.0000 | Freq: Once | INTRAVENOUS | Status: AC | PRN
  Administered 2018-03-28: 4 via INTRAVENOUS

## 2018-03-28 MED ORDER — SODIUM CHLORIDE 0.9 % IV BOLUS
2000.0000 mL | Freq: Once | INTRAVENOUS | Status: AC
  Administered 2018-03-28: 2000 mL via INTRAVENOUS

## 2018-03-28 MED ORDER — TECHNETIUM TC 99M DIETHYLENETRIAME-PENTAACETIC ACID: 30.0000 | Freq: Once | INTRAVENOUS | Status: DC | PRN

## 2018-03-28 MED ORDER — SODIUM CHLORIDE 0.9 % IV SOLN
1.0000 g | INTRAVENOUS | Status: DC
  Administered 2018-03-28: 1 g via INTRAVENOUS
  Filled 2018-03-28: qty 10

## 2018-03-28 MED ORDER — NALOXONE HCL 2 MG/2ML IJ SOSY
1.0000 mg | PREFILLED_SYRINGE | Freq: Once | INTRAMUSCULAR | Status: AC
  Administered 2018-03-28: 1 mg via INTRAVENOUS
  Filled 2018-03-28: qty 2

## 2018-03-28 MED ORDER — NALOXONE HCL 0.4 MG/ML IJ SOLN
INTRAMUSCULAR | Status: AC
  Filled 2018-03-28: qty 3

## 2018-03-28 MED ORDER — HEPARIN SODIUM (PORCINE) 5000 UNIT/ML IJ SOLN
5000.0000 [IU] | Freq: Three times a day (TID) | INTRAMUSCULAR | Status: DC
  Administered 2018-03-28 – 2018-04-03 (×17): 5000 [IU] via SUBCUTANEOUS
  Filled 2018-03-28 (×19): qty 1

## 2018-03-28 NOTE — Progress Notes (Signed)
PROGRESS NOTE                                                                                                                                                                                                             Patient Demographics:    Jeanne Leach, is a 52 y.o. female, DOB - July 29, 1965, OZH:[id number]  Admit date - 03/27/2018   Admitting Physician Eduard Clos, MD  Outpatient Primary MD for the patient is Jearld Lesch, MD  LOS - 1  Chief Complaint  Patient presents with  . Respiratory Distress       Brief Narrative - Jeanne Leach is a 52 y.o. female with history of hypertension, chronic pain, anxiety, chronic kidney disease, chronic anemia was found to be weak and drowsy by patient's family.  As per the ER physician and the patient patient's family had come to check on her today to take her for dinner with her other family members when patient was found to be drowsy weak and unable to ambulate because of the weakness.  EMS was called patient was found to be hypoxic weak hypotensive was given fluid bolus and brought to the ER, the ER work-up was suggestive of sepsis due to pyelonephritis along with possible narcotic and benzo overdose.   Subjective:    Jeanne Leach today has, No headache, No chest pain, No abdominal pain - No Nausea, No new weakness tingling or numbness, No Cough - SOB.     Assessment  & Plan :      1.  Second metabolic encephalopathy due to combination of sepsis caused by pyelonephritis along with prescription drug abuse.  Continue supportive care, cultures pending, she has been aggressively hydrated with IV fluids which will be continued, have placed her on empiric Rocephin, follow cultures, procalcitonin was remarkably elevated at around 25.  Offending medications were held, Narcan as needed I also suspect there is some benzo abuse present.  For now n.p.o. except medications.  Mentation is gradually improving.  2.  ARF.  Due to  combination of severe dehydration along with pyelonephritis.  No hydronephrosis on CT scan, she has had a Foley catheter placed in the ER which I will continue to monitor intake and output strictly, creatinine is close to 9 however she has now good urine output, no signs of uremia or need for dialysis yet.  Hydrate and monitor.  Will also check renal ultrasound to rule out underlying CKD.  3.  Possible pneumonia on chest x-ray.  On Rocephin will add doxycycline and monitor.  4.  Acute hypoxic and hypercapnic respiratory failure.  Likely due to poor mentation along with drug overdose and possible mild pneumonia.  BiPAP support, nebulizer treatments as needed and monitor.  Clinically she is improving.  5.  Severe metabolic acidosis.  Due to combination of sepsis and renal failure, will place on low-dose bicarb drip and monitor.  6.  Anemia of chronic disease.  Monitor.  7.  Elevated LFTs likely due to shock liver from hypotension and dehydration.  Hydrate and monitor trend improving.  INR is 1.1 suggesting good synthetic function.  8.  Elevated d-dimer.  Due to infection.  VQ scan ordered upon admission has been done and results are pending will monitor clinical suspicion for PE is extremely low.  9.  Prescription drug abuse.  Counseled.  Urine drug screen ordered as well.  Tylenol and salicylate levels are stable.    Family Communication  :  None  Code Status :  Full  Disposition Plan  :  Stepdown  Consults  :  None  Procedures  :    CT - 1. No CT evidence for nephrolithiasis or obstructive uropathy. 2. Mild hazy perinephric fat stranding about the kidneys bilaterally, right greater than left. Findings are nonspecific, but could reflect sequelae of acute upper urinary tract infection/pyelonephritis. Correlation with urinalysis recommended. Mild circumferential bladder wall thickening may be related to incomplete distension and/or concomitant cystitis. 3. Patchy multifocal nodular  tree-in-bud opacities within the visualized lung bases, suspicious for acute endobronchial infection/small airways disease. 4. No other acute abnormality within the abdomen and pelvis.  DVT Prophylaxis  :   Heparin    Lab Results  Component Value Date   PLT 220 03/28/2018    Diet :  Diet Order            Diet NPO time specified Except for: Sips with Meds  Diet effective now               Inpatient Medications Scheduled Meds: . mouth rinse  15 mL Mouth Rinse BID  . naLOXone (NARCAN)  injection  1 mg Intravenous Once   Continuous Infusions: . sodium chloride 150 mL/hr at 03/28/18 0837  . cefTRIAXone (ROCEPHIN)  IV    . doxycycline (VIBRAMYCIN) IV     PRN Meds:.technetium TC 102M diethylenetriame-pentaacetic acid  Antibiotics  :   Anti-infectives (From admission, onward)   Start     Dose/Rate Route Frequency Ordered Stop   03/28/18 1700  ceFEPIme (MAXIPIME) 1 g in sodium chloride 0.9 % 100 mL IVPB  Status:  Discontinued     1 g 200 mL/hr over 30 Minutes Intravenous Every 24 hours 03/27/18 2154 03/28/18 1303   03/28/18 1400  cefTRIAXone (ROCEPHIN) 1 g in sodium chloride 0.9 % 100 mL IVPB     1 g 200 mL/hr over 30 Minutes Intravenous Every 24 hours 03/28/18 1303 03/31/18 1359   03/28/18 1315  doxycycline (VIBRAMYCIN) 100 mg in sodium chloride 0.9 % 250 mL IVPB     100 mg 125 mL/hr over 120 Minutes Intravenous Every 12 hours 03/28/18 1307     03/28/18 0200  metroNIDAZOLE (FLAGYL) IVPB 500 mg  Status:  Discontinued     500 mg 100 mL/hr over 60 Minutes Intravenous Every 8 hours 03/27/18 2139 03/28/18 1303   03/27/18 2154  vancomycin variable dose per unstable renal function (pharmacist dosing)  Status:  Discontinued      Does not apply See admin instructions  03/27/18 2154 03/28/18 1303   03/27/18 2145  ceFEPIme (MAXIPIME) 2 g in sodium chloride 0.9 % 100 mL IVPB  Status:  Discontinued     2 g 200 mL/hr over 30 Minutes Intravenous  Once 03/27/18 2139 03/27/18 2145   03/27/18  2145  vancomycin (VANCOCIN) IVPB 1000 mg/200 mL premix  Status:  Discontinued     1,000 mg 200 mL/hr over 60 Minutes Intravenous  Once 03/27/18 2139 03/27/18 2146   03/27/18 1715  metroNIDAZOLE (FLAGYL) IVPB 500 mg     500 mg 100 mL/hr over 60 Minutes Intravenous  Once 03/27/18 1705 03/27/18 1856   03/27/18 1700  ceFEPIme (MAXIPIME) 2 g in sodium chloride 0.9 % 100 mL IVPB     2 g 200 mL/hr over 30 Minutes Intravenous  Once 03/27/18 1654 03/27/18 1804   03/27/18 1700  vancomycin (VANCOCIN) 2,000 mg in sodium chloride 0.9 % 500 mL IVPB     2,000 mg 250 mL/hr over 120 Minutes Intravenous  Once 03/27/18 1654 03/27/18 2008          Objective:   Vitals:   03/28/18 0610 03/28/18 0812 03/28/18 0830 03/28/18 1139  BP: 102/62 102/62 (!) 95/56 112/69  Pulse:  83  83  Resp: 19 12  13   Temp: 100.3 F (37.9 C)  100.3 F (37.9 C)   TempSrc: Oral  Axillary   SpO2: 93% 98%  97%  Weight:      Height:        Wt Readings from Last 3 Encounters:  03/28/18 100.2 kg  05/16/15 90.7 kg  10/13/14 86.2 kg     Intake/Output Summary (Last 24 hours) at 03/28/2018 1307 Last data filed at 03/28/2018 0504 Gross per 24 hour  Intake 3938.09 ml  Output 125 ml  Net 3813.09 ml     Physical Exam  Awake but somnolent, No new F.N deficits, Normal affect [place].AT,PERRAL Supple Neck,No JVD, No cervical lymphadenopathy appriciated.  Symmetrical Chest wall movement, Good air movement bilaterally, CTAB RRR,No Gallops,Rubs or new Murmurs, No Parasternal Heave +ve B.Sounds, Abd Soft, No tenderness, No organomegaly appriciated, No rebound - guarding or rigidity. No Cyanosis, Clubbing or edema, No new Rash or bruise      Data Review:    CBC Recent Labs  Lab 03/27/18 1654 03/27/18 2217 03/28/18 0108  WBC 15.3* 13.7* 14.0*  HGB 11.7* 10.4* 11.1*  HCT 37.4 31.8* 33.7*  PLT 223 206 220  MCV 99.5 96.4 98.0  MCH 31.1 31.5 32.3  MCHC 31.3 32.7 32.9  RDW 12.9 12.9 13.1  LYMPHSABS 1.2  --  1.6    MONOABS 1.2*  --  1.2*  EOSABS 0.2  --  0.0  BASOSABS 0.0  --  0.0    Chemistries  Recent Labs  Lab 03/27/18 1654 03/27/18 2217 03/28/18 0108 03/28/18 0733  NA 135  --  135 135  K 4.4  --  4.3 3.8  CL 99  --  104 108  CO2 19*  --  17* 15*  GLUCOSE 132*  --  132* 112*  BUN 62*  --  61* 60*  CREATININE 9.80* 8.74* 8.74* 8.48*  CALCIUM 8.0*  --  7.6* 7.2*  AST 575*  --  599* 449*  ALT 254*  --  258* 210*  ALKPHOS 41  --  38 33*  BILITOT 0.3  --  0.5 0.5   ------------------------------------------------------------------------------------------------------------------ No results for input(s): CHOL, HDL, LDLCALC, TRIG, CHOLHDL, LDLDIRECT in the last 72 hours.  Lab Results  Component Value Date   HGBA1C 6.3 (H) 08/08/2014   ------------------------------------------------------------------------------------------------------------------ Recent Labs    03/27/18 2217  TSH 3.195   ------------------------------------------------------------------------------------------------------------------ Recent Labs    03/28/18 0733  VITAMINB12 207  FOLATE 9.9  FERRITIN 418*  TIBC 151*  IRON 17*  RETICCTPCT 1.3    Coagulation profile Recent Labs  Lab 03/27/18 2217 03/28/18 0733  INR 1.16 1.21    Recent Labs    03/27/18 2217  DDIMER 2.31*    Cardiac Enzymes Recent Labs  Lab 03/27/18 2217 03/28/18 0733 03/28/18 1140  TROPONINI 0.03* 0.03* 0.03*   ------------------------------------------------------------------------------------------------------------------ No results found for: BNP  Micro Results Recent Results (from the past 240 hour(s))  Blood culture (routine x 2)     Status: None (Preliminary result)   Collection Time: 03/27/18  5:05 PM  Result Value Ref Range Status   Specimen Description SITE NOT SPECIFIED  Final   Special Requests   Final    BOTTLES DRAWN AEROBIC AND ANAEROBIC Blood Culture adequate volume   Culture   Final    NO GROWTH < 12  HOURS Performed at [facility], [address] [place], [place] [address]    Report Status PENDING  Incomplete  Blood culture (routine x 2)     Status: None (Preliminary result)   Collection Time: 03/27/18  5:15 PM  Result Value Ref Range Status   Specimen Description SITE NOT SPECIFIED  Final   Special Requests   Final    BOTTLES DRAWN AEROBIC AND ANAEROBIC Blood Culture results may not be optimal due to an inadequate volume of blood received in culture bottles   Culture   Final    NO GROWTH < 12 HOURS Performed at [facility], [address], [place], [place] [address]    Report Status PENDING  Incomplete  MRSA PCR Screening     Status: None   Collection Time: 03/27/18  9:53 PM  Result Value Ref Range Status   MRSA by PCR NEGATIVE NEGATIVE Final    Comment:        The GeneXpert MRSA Assay (FDA approved for NASAL specimens only), is one component of a comprehensive MRSA colonization surveillance program. It is not intended to diagnose MRSA infection nor to guide or monitor treatment for MRSA infections. Performed at [facility], [address] [place], [place] [address]     Radiology Reports Ct Head Wo Contrast  Result Date: 03/28/2018 CLINICAL DATA:  Altered mental status EXAM: CT HEAD WITHOUT CONTRAST TECHNIQUE: Contiguous axial images were obtained from the base of the skull through the vertex without intravenous contrast. COMPARISON:  Head CT 10/04/2015 FINDINGS: Brain: There is no mass, hemorrhage or extra-axial collection. The size and configuration of the ventricles and extra-axial CSF spaces are normal. The brain parenchyma is normal, without evidence of acute or chronic infarction. Vascular: No abnormal hyperdensity of the major intracranial arteries or dural venous sinuses. No intracranial atherosclerosis. Skull: The visualized skull base, calvarium and extracranial soft tissues are normal. Sinuses/Orbits: No fluid levels or advanced mucosal  thickening of the visualized paranasal sinuses. No mastoid or middle ear effusion. The orbits are normal. IMPRESSION: Normal head CT. Electronically Signed   By: Deatra Robinson M.D.   On: 03/28/2018 06:01   Dg Chest Portable 1 View  Result Date: 03/27/2018 CLINICAL DATA:  Severe cough hypoxia EXAM: PORTABLE CHEST 1 VIEW COMPARISON:  CT 10/04/2015, radiograph 11/03/2014 FINDINGS: No focal consolidation or pleural effusion. Stable cardiomediastinal silhouette. No  pneumothorax. IMPRESSION: No active disease. Electronically Signed   By: Jasmine PangKim  Fujinaga M.D.   On: 03/27/2018 17:54   Ct Renal Stone Study  Result Date: 03/27/2018 CLINICAL DATA:  Initial evaluation for acute bilateral flank pain. EXAM: CT ABDOMEN AND PELVIS WITHOUT CONTRAST TECHNIQUE: Multidetector CT imaging of the abdomen and pelvis was performed following the standard protocol without IV contrast. COMPARISON:  Prior CT from 10/04/2015. FINDINGS: Lower chest: Dependent atelectatic changes noted within the visualized lung bases. Few additional scattered tree-in-bud nodular densities noted, nonspecific, but suggestive of possible endobronchial infection/small airways disease. Hepatobiliary: Liver demonstrates a normal unenhanced appearance. Gallbladder within normal limits. No biliary dilatation. Pancreas: Mild diffuse fatty infiltration the pancreas noted. Pancreas otherwise unremarkable without acute inflammatory changes. Spleen: Spleen within normal limits. Adrenals/Urinary Tract: Adrenal glands are normal. Kidneys equal in size. No nephrolithiasis or hydronephrosis. No radiopaque calculi seen along the course of either renal collecting system. No hydroureter. Subtle hazy perinephric fat stranding seen about the kidneys bilaterally, right slightly greater than left, raising the possibility for possible upper urinary tract infection. No perinephric collections seen on this noncontrast examination. No layering stones within the bladder lumen. Mild  circumferential wall thickening about the bladder. Stomach/Bowel: Stomach within normal limits. No evidence for bowel obstruction. Normal appendix. No acute inflammatory changes seen about the bowels. Vascular/Lymphatic: Mild aorto bi-iliac atherosclerotic disease. No aneurysm. No adenopathy. Reproductive: Uterus and ovaries within normal limits. Other: No free air or fluid. Small fat containing paraumbilical hernia noted. Musculoskeletal: No acute osseous abnormality. No worrisome lytic or blastic osseous lesions. Mild multilevel facet arthropathy noted within the lumbar spine. IMPRESSION: 1. No CT evidence for nephrolithiasis or obstructive uropathy. 2. Mild hazy perinephric fat stranding about the kidneys bilaterally, right greater than left. Findings are nonspecific, but could reflect sequelae of acute upper urinary tract infection/pyelonephritis. Correlation with urinalysis recommended. Mild circumferential bladder wall thickening may be related to incomplete distension and/or concomitant cystitis. 3. Patchy multifocal nodular tree-in-bud opacities within the visualized lung bases, suspicious for acute endobronchial infection/small airways disease. 4. No other acute abnormality within the abdomen and pelvis. Electronically Signed   By: Rise MuBenjamin  McClintock M.D.   On: 03/27/2018 20:09    Time Spent in minutes  30   Susa RaringPrashant Lois Slagel M.D on 03/28/2018 at 1:07 PM  To page go to [email or web address] - password [email or web address]

## 2018-03-28 NOTE — Progress Notes (Signed)
RT NOTES: Placed patient on bipap per Dr Thedore MinsSingh. Order to repeat ABG in 2 hours.

## 2018-03-28 NOTE — Progress Notes (Signed)
Pts temp is 100.3. MD made aware. Will continue to monitor.

## 2018-03-28 NOTE — Progress Notes (Signed)
RT NOTES: Entered room to check on patient and patient had gone for a procedure. Bipap was left in room. Spoke with RN who said Dr Thedore MinsSingh was aware she was without bipap and only wanted her to wear it at night and PRN.

## 2018-03-29 ENCOUNTER — Inpatient Hospital Stay (HOSPITAL_COMMUNITY): Payer: Medicare Other

## 2018-03-29 LAB — COMPREHENSIVE METABOLIC PANEL
ALBUMIN: 2 g/dL — AB (ref 3.5–5.0)
ALT: 181 U/L — ABNORMAL HIGH (ref 0–44)
AST: 303 U/L — AB (ref 15–41)
Alkaline Phosphatase: 35 U/L — ABNORMAL LOW (ref 38–126)
Anion gap: 15 (ref 5–15)
BUN: 63 mg/dL — AB (ref 6–20)
CHLORIDE: 104 mmol/L (ref 98–111)
CO2: 18 mmol/L — AB (ref 22–32)
Calcium: 7.9 mg/dL — ABNORMAL LOW (ref 8.9–10.3)
Creatinine, Ser: 9.36 mg/dL — ABNORMAL HIGH (ref 0.44–1.00)
GFR calc Af Amer: 5 mL/min — ABNORMAL LOW (ref >60)
GFR calc non Af Amer: 4 mL/min — ABNORMAL LOW (ref >60)
GLUCOSE: 105 mg/dL — AB (ref 70–99)
POTASSIUM: 3.6 mmol/L (ref 3.5–5.1)
SODIUM: 137 mmol/L (ref 135–145)
Total Bilirubin: 0.7 mg/dL (ref 0.3–1.2)
Total Protein: 5.6 g/dL — ABNORMAL LOW (ref 6.5–8.1)

## 2018-03-29 LAB — CBC WITH DIFFERENTIAL/PLATELET
Abs Immature Granulocytes: 0.08 10*3/uL — ABNORMAL HIGH (ref 0.00–0.07)
BASOS ABS: 0 10*3/uL (ref 0.0–0.1)
BASOS PCT: 0 %
Eosinophils Absolute: 0.1 10*3/uL (ref 0.0–0.5)
Eosinophils Relative: 1 %
HCT: 27.1 % — ABNORMAL LOW (ref 36.0–46.0)
Hemoglobin: 8.9 g/dL — ABNORMAL LOW (ref 12.0–15.0)
IMMATURE GRANULOCYTES: 1 %
Lymphocytes Relative: 13 %
Lymphs Abs: 1.7 10*3/uL (ref 0.7–4.0)
MCH: 31.6 pg (ref 26.0–34.0)
MCHC: 32.8 g/dL (ref 30.0–36.0)
MCV: 96.1 fL (ref 80.0–100.0)
Monocytes Absolute: 1.3 10*3/uL — ABNORMAL HIGH (ref 0.1–1.0)
Monocytes Relative: 10 %
NEUTROS ABS: 9.6 10*3/uL — AB (ref 1.7–7.7)
NEUTROS PCT: 75 %
NRBC: 0 % (ref 0.0–0.2)
PLATELETS: 215 10*3/uL (ref 150–400)
RBC: 2.82 MIL/uL — ABNORMAL LOW (ref 3.87–5.11)
RDW: 13.1 % (ref 11.5–15.5)
WBC: 12.8 10*3/uL — ABNORMAL HIGH (ref 4.0–10.5)

## 2018-03-29 LAB — PHOSPHORUS: Phosphorus: 6.3 mg/dL — ABNORMAL HIGH (ref 2.5–4.6)

## 2018-03-29 LAB — LEGIONELLA PNEUMOPHILA SEROGP 1 UR AG: L. PNEUMOPHILA SEROGP 1 UR AG: NEGATIVE

## 2018-03-29 LAB — BASIC METABOLIC PANEL
Anion gap: 14 (ref 5–15)
BUN: 64 mg/dL — ABNORMAL HIGH (ref 6–20)
CO2: 17 mmol/L — ABNORMAL LOW (ref 22–32)
CREATININE: 9.15 mg/dL — AB (ref 0.44–1.00)
Calcium: 8.2 mg/dL — ABNORMAL LOW (ref 8.9–10.3)
Chloride: 105 mmol/L (ref 98–111)
GFR calc Af Amer: 5 mL/min — ABNORMAL LOW (ref >60)
GFR calc non Af Amer: 4 mL/min — ABNORMAL LOW (ref >60)
Glucose, Bld: 114 mg/dL — ABNORMAL HIGH (ref 70–99)
Potassium: 3.9 mmol/L (ref 3.5–5.1)
Sodium: 136 mmol/L (ref 135–145)

## 2018-03-29 LAB — PROTEIN ELECTROPHORESIS, SERUM
A/G Ratio: 0.7 (ref 0.7–1.7)
ALPHA-1-GLOBULIN: 0.4 g/dL (ref 0.0–0.4)
ALPHA-2-GLOBULIN: 1 g/dL (ref 0.4–1.0)
Albumin ELP: 2 g/dL — ABNORMAL LOW (ref 2.9–4.4)
Beta Globulin: 0.7 g/dL (ref 0.7–1.3)
GAMMA GLOBULIN: 0.8 g/dL (ref 0.4–1.8)
Globulin, Total: 2.9 g/dL (ref 2.2–3.9)
TOTAL PROTEIN ELP: 4.9 g/dL — AB (ref 6.0–8.5)

## 2018-03-29 LAB — EXPECTORATED SPUTUM ASSESSMENT W REFEX TO RESP CULTURE

## 2018-03-29 LAB — HEPATITIS PANEL, ACUTE
HCV Ab: <0.1 s/co ratio (ref 0.0–0.9)
HEP B C IGM: NEGATIVE
Hep A IgM: NEGATIVE
Hepatitis B Surface Ag: NEGATIVE

## 2018-03-29 LAB — URINE CULTURE: Culture: >=100000 — AB

## 2018-03-29 LAB — MAGNESIUM: MAGNESIUM: 1.9 mg/dL (ref 1.7–2.4)

## 2018-03-29 LAB — EXPECTORATED SPUTUM ASSESSMENT W GRAM STAIN, RFLX TO RESP C

## 2018-03-29 LAB — BRAIN NATRIURETIC PEPTIDE: B Natriuretic Peptide: 173.1 pg/mL — ABNORMAL HIGH (ref 0.0–100.0)

## 2018-03-29 LAB — VANCOMYCIN, RANDOM: Vancomycin Rm: 24

## 2018-03-29 MED ORDER — TRAMADOL HCL 50 MG PO TABS
50.0000 mg | ORAL_TABLET | Freq: Once | ORAL | Status: AC
  Administered 2018-03-29: 50 mg via ORAL
  Filled 2018-03-29: qty 1

## 2018-03-29 MED ORDER — LACTATED RINGERS IV SOLN
INTRAVENOUS | Status: DC
  Administered 2018-03-29: 09:00:00 via INTRAVENOUS

## 2018-03-29 MED ORDER — FUROSEMIDE 10 MG/ML IJ SOLN
40.0000 mg | Freq: Once | INTRAMUSCULAR | Status: AC
  Administered 2018-03-29: 40 mg via INTRAVENOUS
  Filled 2018-03-29: qty 4

## 2018-03-29 MED ORDER — LACTATED RINGERS IV SOLN
INTRAVENOUS | Status: DC
  Administered 2018-03-29: 11:00:00 via INTRAVENOUS

## 2018-03-29 MED ORDER — SODIUM CHLORIDE 0.9 % IV SOLN
1.0000 g | INTRAVENOUS | Status: AC
  Administered 2018-03-29 – 2018-04-04 (×7): 1 g via INTRAVENOUS
  Filled 2018-03-29 (×8): qty 1

## 2018-03-29 MED ORDER — POTASSIUM CHLORIDE CRYS ER 20 MEQ PO TBCR
20.0000 meq | EXTENDED_RELEASE_TABLET | Freq: Once | ORAL | Status: AC
  Administered 2018-03-29: 20 meq via ORAL
  Filled 2018-03-29: qty 1

## 2018-03-29 NOTE — Progress Notes (Signed)
Took pt off of bipap per pt request. Pt states she is finished sleeping. Will continue to monitor pt. Nelda MarseilleJenny Thacker, RN

## 2018-03-29 NOTE — Progress Notes (Signed)
PROGRESS NOTE                                                                                                                                                                                                             Patient Demographics:    Jeanne Leach, is a 52 y.o. female, DOB - 10-04-1965, ZOX:[id number]  Admit date - 03/27/2018   Admitting Physician Eduard Clos, MD  Outpatient Primary MD for the patient is Jearld Lesch, MD  LOS - 2  Chief Complaint  Patient presents with  . Respiratory Distress       Brief Narrative - Jeanne Leach is a 52 y.o. female with history of hypertension, chronic pain, anxiety, chronic kidney disease, chronic anemia was found to be weak and drowsy by patient's family.  As per the ER physician and the patient patient's family had come to check on her today to take her for dinner with her other family members when patient was found to be drowsy weak and unable to ambulate because of the weakness.  EMS was called patient was found to be hypoxic weak hypotensive was given fluid bolus and brought to the ER, the ER work-up was suggestive of sepsis due to pyelonephritis along with possible narcotic and benzo overdose.   Subjective:   Patient in bed, appears comfortable, denies any headache, no fever, no chest pain or pressure, no shortness of breath , no abdominal pain. No focal weakness.     Assessment  & Plan :      1.  Toxic & Metabolic Encephalopathy due to combination of ARF, sepsis caused by pyelonephritis along with prescription drug abuse.  Treated conservatively, offending medications were held, she has been placed on antibiotics, being hydrated with IV fluids, mentation has improved.  2.  ARF.  Due to combination of rhabdomyolysis along with severe dehydration and pyelonephritis.  No hydronephrosis on CT scan, she has had a Foley catheter placed in the ER which I will continue to monitor intake and output strictly, she had  an unremarkable renal ultrasound, has been given about 7 L of fluid with only 100 cc of urine output in 24 hours, nephrology consulted, rechallenged with fluid bolus along with IV Lasix with good results, continue gentle hydration and monitor.  3.  Possible pneumonia on chest x-ray.  On Rocephin will add doxycycline and monitor.  4.  Acute hypoxic and hypercapnic respiratory failure.  Likely due to poor mentation along with drug overdose and possible mild pneumonia.  Initially required BiPAP now much improved, continue present line of care and monitor.  Currently on room air.  5.  Severe metabolic acidosis.  Due to combination of sepsis and renal failure, will place on low-dose bicarb drip and monitor.  6.  Anemia of chronic disease.  Monitor.  7.  Elevated LFTs likely due to shock liver from hypotension and dehydration.  Hydrate and monitor trend improving.  INR is 1.1 suggesting good synthetic function.  8.  Elevated d-dimer.  Due to infection.  VQ scan ordered upon admission has been done and results are pending will monitor clinical suspicion for PE is extremely low.  9.  Prescription drug abuse.  Counseled.  Urine drug screen ordered as well.  Tylenol and salicylate levels are stable.  10.  ESBL pyelonephritis.  Currently on meropenem which will be started on 03/29/2018 morning.    Family Communication  :  None  Code Status :  Full  Disposition Plan  :  Stepdown  Consults  :  None  Procedures  :    CT - 1. No CT evidence for nephrolithiasis or obstructive uropathy. 2. Mild hazy perinephric fat stranding about the kidneys bilaterally, right greater than left. Findings are nonspecific, but could reflect sequelae of acute upper urinary tract infection/pyelonephritis. Correlation with urinalysis recommended. Mild circumferential bladder wall thickening may be related to incomplete distension and/or concomitant cystitis. 3. Patchy multifocal nodular tree-in-bud opacities within the  visualized lung bases, suspicious for acute endobronchial infection/small airways disease. 4. No other acute abnormality within the abdomen and pelvis.  DVT Prophylaxis  :   Heparin    Lab Results  Component Value Date   PLT 215 03/29/2018    Diet :  Diet Order            Diet full liquid Room service appropriate? Yes; Fluid consistency: Thin  Diet effective now               Inpatient Medications Scheduled Meds: . heparin injection (subcutaneous)  5,000 Units Subcutaneous Q8H  . mouth rinse  15 mL Mouth Rinse BID   Continuous Infusions: . doxycycline (VIBRAMYCIN) IV 100 mg (03/29/18 0146)  . lactated ringers    .  sodium bicarbonate  infusion 1000 mL 50 mL/hr at 03/29/18 1002   PRN Meds:.technetium TC 87M diethylenetriame-pentaacetic acid  Antibiotics  :   Anti-infectives (From admission, onward)   Start     Dose/Rate Route Frequency Ordered Stop   03/28/18 1700  ceFEPIme (MAXIPIME) 1 g in sodium chloride 0.9 % 100 mL IVPB  Status:  Discontinued     1 g 200 mL/hr over 30 Minutes Intravenous Every 24 hours 03/27/18 2154 03/28/18 1303   03/28/18 1400  cefTRIAXone (ROCEPHIN) 1 g in sodium chloride 0.9 % 100 mL IVPB  Status:  Discontinued     1 g 200 mL/hr over 30 Minutes Intravenous Every 24 hours 03/28/18 1303 03/29/18 1036   03/28/18 1400  doxycycline (VIBRAMYCIN) 100 mg in sodium chloride 0.9 % 250 mL IVPB     100 mg 125 mL/hr over 120 Minutes Intravenous Every 12 hours 03/28/18 1307     03/28/18 0200  metroNIDAZOLE (FLAGYL) IVPB 500 mg  Status:  Discontinued     500 mg 100 mL/hr over 60 Minutes Intravenous Every 8 hours 03/27/18 2139 03/28/18 1303   03/27/18 2154  vancomycin variable dose per unstable renal function (pharmacist dosing)  Status:  Discontinued      Does not apply See admin instructions 03/27/18  2154 03/28/18 1303   03/27/18 2145  ceFEPIme (MAXIPIME) 2 g in sodium chloride 0.9 % 100 mL IVPB  Status:  Discontinued     2 g 200 mL/hr over 30 Minutes  Intravenous  Once 03/27/18 2139 03/27/18 2145   03/27/18 2145  vancomycin (VANCOCIN) IVPB 1000 mg/200 mL premix  Status:  Discontinued     1,000 mg 200 mL/hr over 60 Minutes Intravenous  Once 03/27/18 2139 03/27/18 2146   03/27/18 1715  metroNIDAZOLE (FLAGYL) IVPB 500 mg     500 mg 100 mL/hr over 60 Minutes Intravenous  Once 03/27/18 1705 03/27/18 1856   03/27/18 1700  ceFEPIme (MAXIPIME) 2 g in sodium chloride 0.9 % 100 mL IVPB     2 g 200 mL/hr over 30 Minutes Intravenous  Once 03/27/18 1654 03/27/18 1804   03/27/18 1700  vancomycin (VANCOCIN) 2,000 mg in sodium chloride 0.9 % 500 mL IVPB     2,000 mg 250 mL/hr over 120 Minutes Intravenous  Once 03/27/18 1654 03/27/18 2008          Objective:   Vitals:   03/29/18 0015 03/29/18 0357 03/29/18 0430 03/29/18 1027  BP:  109/74    Pulse:  75    Resp:  12    Temp: 98.1 F (36.7 C)  99.4 F (37.4 C) 100.1 F (37.8 C)  TempSrc: Axillary  Oral Oral  SpO2:  97%    Weight:      Height:        Wt Readings from Last 3 Encounters:  03/28/18 100.2 kg  05/16/15 90.7 kg  10/13/14 86.2 kg     Intake/Output Summary (Last 24 hours) at 03/29/2018 1038 Last data filed at 03/29/2018 1005 Gross per 24 hour  Intake 5375 ml  Output 950 ml  Net 4425 ml     Physical Exam  Awake Alert, Oriented X 3, No new F.N deficits, Normal affect [place].AT,PERRAL Supple Neck,No JVD, No cervical lymphadenopathy appriciated.  Symmetrical Chest wall movement, Good air movement bilaterally, CTAB RRR,No Gallops, Rubs or new Murmurs, No Parasternal Heave +ve B.Sounds, Abd Soft, No tenderness, No organomegaly appriciated, No rebound - guarding or rigidity. No Cyanosis, Clubbing or edema, No new Rash or bruise    Data Review:    CBC Recent Labs  Lab 03/27/18 1654 03/27/18 2217 03/28/18 0108 03/29/18 0549  WBC 15.3* 13.7* 14.0* 12.8*  HGB 11.7* 10.4* 11.1* 8.9*  HCT 37.4 31.8* 33.7* 27.1*  PLT 223 206 220 215  MCV 99.5 96.4 98.0 96.1  MCH 31.1  31.5 32.3 31.6  MCHC 31.3 32.7 32.9 32.8  RDW 12.9 12.9 13.1 13.1  LYMPHSABS 1.2  --  1.6 1.7  MONOABS 1.2*  --  1.2* 1.3*  EOSABS 0.2  --  0.0 0.1  BASOSABS 0.0  --  0.0 0.0    Chemistries  Recent Labs  Lab 03/27/18 1654 03/27/18 2217 03/28/18 0108 03/28/18 0733 03/29/18 0549  NA 135  --  135 135 137  K 4.4  --  4.3 3.8 3.6  CL 99  --  104 108 104  CO2 19*  --  17* 15* 18*  GLUCOSE 132*  --  132* 112* 105*  BUN 62*  --  61* 60* 63*  CREATININE 9.80* 8.74* 8.74* 8.48* 9.36*  CALCIUM 8.0*  --  7.6* 7.2* 7.9*  MG  --   --   --   --  1.9  AST 575*  --  599* 449* 303*  ALT 254*  --  258* 210* 181*  ALKPHOS 41  --  38 33* 35*  BILITOT 0.3  --  0.5 0.5 0.7   ------------------------------------------------------------------------------------------------------------------ No results for input(s): CHOL, HDL, LDLCALC, TRIG, CHOLHDL, LDLDIRECT in the last 72 hours.  Lab Results  Component Value Date   HGBA1C 6.3 (H) 08/08/2014   ------------------------------------------------------------------------------------------------------------------ Recent Labs    03/27/18 2217  TSH 3.195   ------------------------------------------------------------------------------------------------------------------ Recent Labs    03/28/18 0733  VITAMINB12 207  FOLATE 9.9  FERRITIN 418*  TIBC 151*  IRON 17*  RETICCTPCT 1.3    Coagulation profile Recent Labs  Lab 03/27/18 2217 03/28/18 0733  INR 1.16 1.21    Recent Labs    03/27/18 2217  DDIMER 2.31*    Cardiac Enzymes Recent Labs  Lab 03/27/18 2217 03/28/18 0733 03/28/18 1140  TROPONINI 0.03* 0.03* 0.03*   ------------------------------------------------------------------------------------------------------------------    Component Value Date/Time   BNP 173.1 (H) 03/29/2018 0549    Micro Results Recent Results (from the past 240 hour(s))  Blood culture (routine x 2)     Status: None (Preliminary result)    Collection Time: 03/27/18  5:05 PM  Result Value Ref Range Status   Specimen Description SITE NOT SPECIFIED  Final   Special Requests   Final    BOTTLES DRAWN AEROBIC AND ANAEROBIC Blood Culture adequate volume   Culture   Final    NO GROWTH 2 DAYS Performed at [facility], [address] [place], [place] [address]    Report Status PENDING  Incomplete  Urine culture     Status: Abnormal   Collection Time: 03/27/18  5:12 PM  Result Value Ref Range Status   Specimen Description URINE, RANDOM  Final   Special Requests   Final    NONE Performed at [facility], [address] [place], [place] [address]    Culture (A)  Final    >=100,000 COLONIES/mL ESCHERICHIA COLI Confirmed Extended Spectrum Beta-Lactamase Producer (ESBL).  In bloodstream infections from ESBL organisms, carbapenems are preferred over piperacillin/tazobactam. They are shown to have a lower risk of mortality.    Report Status 03/29/2018 FINAL  Final   Organism ID, Bacteria ESCHERICHIA COLI (A)  Final      Susceptibility   Escherichia coli - MIC*    AMPICILLIN >=32 RESISTANT Resistant     CEFAZOLIN >=64 RESISTANT Resistant     CEFTRIAXONE >=64 RESISTANT Resistant     CIPROFLOXACIN >=4 RESISTANT Resistant     GENTAMICIN >=16 RESISTANT Resistant     IMIPENEM <=0.25 SENSITIVE Sensitive     NITROFURANTOIN <=16 SENSITIVE Sensitive     TRIMETH/SULFA <=20 SENSITIVE Sensitive     AMPICILLIN/SULBACTAM >=32 RESISTANT Resistant     PIP/TAZO <=4 SENSITIVE Sensitive     Extended ESBL POSITIVE Resistant     * >=100,000 COLONIES/mL ESCHERICHIA COLI  Blood culture (routine x 2)     Status: None (Preliminary result)   Collection Time: 03/27/18  5:15 PM  Result Value Ref Range Status   Specimen Description SITE NOT SPECIFIED  Final   Special Requests   Final    BOTTLES DRAWN AEROBIC AND ANAEROBIC Blood Culture results may not be optimal due to an inadequate volume of blood received in culture bottles   Culture    Final    NO GROWTH 2 DAYS Performed at [facility], [address], [place], [place] [address]    Report Status PENDING  Incomplete  MRSA PCR Screening     Status:  None   Collection Time: 03/27/18  9:53 PM  Result Value Ref Range Status   MRSA by PCR NEGATIVE NEGATIVE Final    Comment:        The GeneXpert MRSA Assay (FDA approved for NASAL specimens only), is one component of a comprehensive MRSA colonization surveillance program. It is not intended to diagnose MRSA infection nor to guide or monitor treatment for MRSA infections. Performed at [facility], [address] [place], [place] [address]   Culture, sputum-assessment     Status: None   Collection Time: 03/28/18  7:43 PM  Result Value Ref Range Status   Specimen Description EXPECTORATED SPUTUM  Final   Special Requests NONE  Final   Sputum evaluation   Final    THIS SPECIMEN IS ACCEPTABLE FOR SPUTUM CULTURE Performed at [facility], [address] [place], [place] [address]    Report Status 03/29/2018 FINAL  Final  Culture, respiratory     Status: None (Preliminary result)   Collection Time: 03/28/18  7:43 PM  Result Value Ref Range Status   Specimen Description EXPECTORATED SPUTUM  Final   Special Requests NONE Reflexed from [id number]  Final   Gram Stain   Final    ABUNDANT WBC PRESENT,BOTH PMN AND MONONUCLEAR MODERATE GRAM VARIABLE ROD ABUNDANT YEAST MODERATE SQUAMOUS EPITHELIAL CELLS PRESENT Performed at [facility], [address], [place], [place] [address]    Culture PENDING  Incomplete   Report Status PENDING  Incomplete    Radiology Reports Ct Head Wo Contrast  Result Date: 03/28/2018 CLINICAL DATA:  Altered mental status EXAM: CT HEAD WITHOUT CONTRAST TECHNIQUE: Contiguous axial images were obtained from the base of the skull through the vertex without intravenous contrast. COMPARISON:  Head CT 10/04/2015 FINDINGS: Brain: There is no mass, hemorrhage or extra-axial  collection. The size and configuration of the ventricles and extra-axial CSF spaces are normal. The brain parenchyma is normal, without evidence of acute or chronic infarction. Vascular: No abnormal hyperdensity of the major intracranial arteries or dural venous sinuses. No intracranial atherosclerosis. Skull: The visualized skull base, calvarium and extracranial soft tissues are normal. Sinuses/Orbits: No fluid levels or advanced mucosal thickening of the visualized paranasal sinuses. No mastoid or middle ear effusion. The orbits are normal. IMPRESSION: Normal head CT. Electronically Signed   By: Deatra Robinson M.D.   On: 03/28/2018 06:01   Nm Pulmonary Perfusion  Result Date: 03/28/2018 CLINICAL DATA:  Acute respiratory failure with hypoxia. Droplet precautions for influenza. EXAM: NUCLEAR MEDICINE PERFUSION LUNG SCAN TECHNIQUE: Perfusion images were obtained in multiple projections after intravenous injection of radiopharmaceutical. RADIOPHARMACEUTICALS:  4 millicurie mCi Tc-59m MAA IV COMPARISON:  03/27/2018 FINDINGS: The perfusion lung scan appears normal. No perfusion defect is identified to suggest pulmonary embolus. IMPRESSION: 1. Normal-appearing perfusion lung scan. No perfusion defect to suggest pulmonary embolus. 2. Because the perfusion images were normal, and because the patient is on droplet precautions, I felt that ventilation images would be problematic to obtain and not helpful for diagnosis, and accordingly today's exam is performed as a perfusion lung scan only rather than a ventilation-perfusion scan. Electronically Signed   By: Gaylyn Rong M.D.   On: 03/28/2018 13:09   US Renal  Result Date: 03/28/2018 CLINICAL DATA:  52 y/o  F; acute renal failure. EXAM: RENAL / URINARY TRACT ULTRASOUND COMPLETE COMPARISON:  03/27/2018 CT abdomen and pelvis. FINDINGS: Right Kidney: Renal measurements: 11.4 x 6.3 x 5.9 cm = volume: 222 mL .  Echogenicity within normal limits. No mass or  hydronephrosis visualized. Prominent interpolar kidney Bertin column. Left Kidney: Renal measurements: 12.1 x 6.1 x 4.6 cm = volume: 293 mL. Echogenicity within normal limits. No mass or hydronephrosis visualized. Bladder: Appears normal for degree of bladder distention. Foley catheter in situ. IMPRESSION: Normal renal ultrasound. Electronically Signed   By: Mitzi Hansen M.D.   On: 03/28/2018 16:00   Dg Chest Port 1 View  Result Date: 03/29/2018 CLINICAL DATA:  New onset persistent cough this morning. Acute kidney injury. Acute respiratory failure with hypoxia. EXAM: PORTABLE CHEST 1 VIEW COMPARISON:  Radiographs 11/03/2014 and 03/27/2018. Chest CT 10/04/2015. Abdominal CT 03/27/2018. FINDINGS: 0749 hours. The heart size and mediastinal contours are normal. There are low lung volumes with mild interstitial prominence in both lung bases, similar to recent prior study. The pulmonary vascularity is normal. There is no confluent airspace opacity, pleural effusion or pneumothorax. The bones appear unchanged. IMPRESSION: Interstitial prominence at both lung bases, similar to recent prior study and likely due to a combination of atelectasis and peribronchial inflammation as correlated with recent abdominal CT. No consolidation or significant pleural effusion. Electronically Signed   By: Carey Bullocks M.D.   On: 03/29/2018 08:18   Dg Chest Portable 1 View  Result Date: 03/27/2018 CLINICAL DATA:  Severe cough hypoxia EXAM: PORTABLE CHEST 1 VIEW COMPARISON:  CT 10/04/2015, radiograph 11/03/2014 FINDINGS: No focal consolidation or pleural effusion. Stable cardiomediastinal silhouette. No pneumothorax. IMPRESSION: No active disease. Electronically Signed   By: Jasmine Pang M.D.   On: 03/27/2018 17:54   Ct Renal Stone Study  Result Date: 03/27/2018 CLINICAL DATA:  Initial evaluation for acute bilateral flank pain. EXAM: CT ABDOMEN AND PELVIS WITHOUT CONTRAST TECHNIQUE: Multidetector CT imaging of  the abdomen and pelvis was performed following the standard protocol without IV contrast. COMPARISON:  Prior CT from 10/04/2015. FINDINGS: Lower chest: Dependent atelectatic changes noted within the visualized lung bases. Few additional scattered tree-in-bud nodular densities noted, nonspecific, but suggestive of possible endobronchial infection/small airways disease. Hepatobiliary: Liver demonstrates a normal unenhanced appearance. Gallbladder within normal limits. No biliary dilatation. Pancreas: Mild diffuse fatty infiltration the pancreas noted. Pancreas otherwise unremarkable without acute inflammatory changes. Spleen: Spleen within normal limits. Adrenals/Urinary Tract: Adrenal glands are normal. Kidneys equal in size. No nephrolithiasis or hydronephrosis. No radiopaque calculi seen along the course of either renal collecting system. No hydroureter. Subtle hazy perinephric fat stranding seen about the kidneys bilaterally, right slightly greater than left, raising the possibility for possible upper urinary tract infection. No perinephric collections seen on this noncontrast examination. No layering stones within the bladder lumen. Mild circumferential wall thickening about the bladder. Stomach/Bowel: Stomach within normal limits. No evidence for bowel obstruction. Normal appendix. No acute inflammatory changes seen about the bowels. Vascular/Lymphatic: Mild aorto bi-iliac atherosclerotic disease. No aneurysm. No adenopathy. Reproductive: Uterus and ovaries within normal limits. Other: No free air or fluid. Small fat containing paraumbilical hernia noted. Musculoskeletal: No acute osseous abnormality. No worrisome lytic or blastic osseous lesions. Mild multilevel facet arthropathy noted within the lumbar spine. IMPRESSION: 1. No CT evidence for nephrolithiasis or obstructive uropathy. 2. Mild hazy perinephric fat stranding about the kidneys bilaterally, right greater than left. Findings are nonspecific, but  could reflect sequelae of acute upper urinary tract infection/pyelonephritis. Correlation with urinalysis recommended. Mild circumferential bladder wall thickening may be related to incomplete distension and/or concomitant cystitis. 3. Patchy multifocal nodular tree-in-bud opacities within the visualized lung bases, suspicious for acute endobronchial infection/small airways disease.  4. No other acute abnormality within the abdomen and pelvis. Electronically Signed   By: Rise Mu M.D.   On: 03/27/2018 20:09    Time Spent in minutes  30   Susa Raring M.D on 03/29/2018 at 10:38 AM  To page go to [email or web address] - password [email or web address]

## 2018-03-29 NOTE — Plan of Care (Signed)

## 2018-03-29 NOTE — Progress Notes (Signed)
Pharmacy Antibiotic Note  Jeanne HinesDeborah Leach is a 52 y.o. female admitted on 03/27/2018 , the ER work-up was suggestive of sepsis due to pyelonephritis along with possible narcotic and benzo overdose. MD notes possible pneumonia on chest x-ray. added doxycycline yesterday.  Pharmacy has been consulted for Meropenem dosing for  E.coli  ESBL pyelonephritis. ARF with SCr of 9.36    Plan: Meropenem 1 gm IV q24h Monitor clinical status, renal function and culture results daily.     Height: 5\' 5"  (165.1 cm) Weight: 220 lb 14.4 oz (100.2 kg) IBW/kg (Calculated) : 57  Temp (24hrs), Avg:99.3 F (37.4 C), Min:98.1 F (36.7 C), Max:100.1 F (37.8 C)  Recent Labs  Lab 03/27/18 1654 03/27/18 1731 03/27/18 1946 03/27/18 2011 03/27/18 2217 03/28/18 0108 03/28/18 0733 03/29/18 0549  WBC 15.3*  --   --   --  13.7* 14.0*  --  12.8*  CREATININE 9.80*  --   --   --  8.74* 8.74* 8.48* 9.36*  LATICACIDVEN 2.5* 3.21* 1.6 1.72 1.4 1.3  --   --   VANCORANDOM  --   --   --   --   --   --   --  24    Estimated Creatinine Clearance: 8.3 mL/min (A) (by C-G formula based on SCr of 9.36 mg/dL (H)).    No Known Allergies  Antimicrobials this admission: 11/26 vanc>>11/27  (VR = 24 on 11/28 11/26 cefepime>>11/27 11/26 flagyl>>11/27 11/27: Doxy>> 11/28: Meropenem>>  Dose adjustments this admission:   Microbiology results: 1/26 urine>> E.coli  ESBL  11/26 blood>>ngtd  11/26 MRSA PCR>>negative 11/27 sputum:  mod gram variable rod, abundant yeast  Jeanne Leach, RPh Clinical Pharmacist Please check AMION for all [facility] phone numbers After 10:00 PM, call Main Pharmacy [phone] 03/29/2018 10:46 AM

## 2018-03-29 NOTE — Consult Note (Signed)
Jeanne Leach Admit Date: 03/27/2018 03/29/2018 Jeanne Leach Requesting Physician:  Burney Gauze  Reason for Consult:  AKI HPI:  52 year old female admitted 11/26 after being bedbound for 5 days with nausea/vomiting.  PMH Incudes:  Hypertension, prescribed ARB but patient denies use prior to admission  Chronic pain on narcotics and benzodiazepines  Reported history of CKD, last serum creatinine 1.7 in 2017  Tobacco user  Patient's presenting serum creatinine was 9.8.  Today is 9.36.  Initial CK was 33,449, yesterday it was 23,560.  Patient denies use of nonsteroidals prior to admission.  Patient denies use of antibiotics, including sulfa antibiotics prior to admission; home medication list includes TMP/SMX.  No statins or fibroids on medication list.  Renal ultrasound on 11/27 revealed normal-sized kidneys without obstruction or hydronephrosis.  CT on 11/26, renal stone protocol without IV contrast also without obstruction, hydronephrosis, no stones identified.  There was some findings consistent with pyelonephritis.  Urine analysis with large hemoglobin, 21-50 red blood cells per high-powered field.  Also with pyuria.  Urine sodium was 59 yesterday.  Patient has been admitted, hydrated with sodium bicarbonate and lactated Ringer's.  She is 8.2 L positive since admission.  Urine output was 0.7 L yesterday.  Current medications include meropenem and doxycycline.  Urine culture revealed ESBL E. Coli; previously was on ceftriaxone.  Blood cultures are negative to date.  Patient without significant edema.  Currently on nasal cannula.  Blood pressures are stable.  Denies muscle aches.   Creatinine (mg/dL)  Date Value  40/98/1191 0.94   Creatinine, Ser (mg/dL)  Date Value  47/82/9562 9.36 (H)  03/28/2018 8.48 (H)  03/28/2018 8.74 (H)  03/27/2018 8.74 (H)  03/27/2018 9.80 (H)  10/04/2015 1.70 (H)  10/04/2015 1.91 (H)  11/05/2014 1.06 (H)  11/04/2014 0.94  11/03/2014 2.23 (H)   ] I/Os: I/O last 3 completed shifts: In: 6963.1 [P.O.:480; I.V.:3058.2; IV Piggyback:3424.9] Out: 800 [Urine:800]   ROS NSAIDS: denies use IV Contrast no exposure TMP/SMX  No exposure Hypotension not present Balance of 12 systems is negative w/ exceptions as above  PMH  Past Medical History:  Diagnosis Date  . Anxiety   . Bipolar 1 disorder (HCC)   . Chronic lower back pain   . Degenerative disc disease, cervical   . Degenerative disc disease, lumbar   . Depression   . Heart murmur   . High cholesterol   . Hypertension    PSH  Past Surgical History:  Procedure Laterality Date  . CARPAL TUNNEL RELEASE Left   . CESAREAN SECTION  1999  . LIPOMA EXCISION Left    "above my breast"   FH  Family History  Problem Relation Age of Onset  . Hypertension Unknown   . Diabetes Mellitus II Mother    SH  reports that she has been smoking cigarettes. She has a 35.00 pack-year smoking history. She has quit using smokeless tobacco.  Her smokeless tobacco use included chew. She reports that she drank alcohol. She reports that she has current or past drug history. Drug: Oxycodone. Allergies No Known Allergies Home medications Prior to Admission medications   Medication Sig Start Date End Date Taking? Authorizing Provider  ALPRAZolam Prudy Feeler) 1 MG tablet Take 1 mg by mouth 3 (three) times daily. 03/09/18  Yes [provider]  valsartan (DIOVAN) 80 MG tablet Take 80 mg by mouth daily.   Yes [provider]  alprazolam Prudy Feeler) 2 MG tablet Take 0.5 tablets (1 mg total) by mouth 3 (three) times  daily as needed for sleep or anxiety. Patient not taking: Reported on 03/27/2018 08/09/14   Edsel PetrinMikhail, Maryann, DO  Oxycodone HCl 20 MG TABS Use only 1/2 tab every 6 hours as needed Patient not taking: Reported on 03/27/2018 11/06/14   Auburn BilberryPatel, Shreyang, MD  sulfamethoxazole-trimethoprim (BACTRIM DS,SEPTRA DS) 800-160 MG tablet Take 2 tablets by mouth 2 (two) times daily. Patient not  taking: Reported on 10/04/2015 05/16/15   Menshew, Charlesetta IvoryJenise V Bacon, PA-C    Current Medications Scheduled Meds: . heparin injection (subcutaneous)  5,000 Units Subcutaneous Q8H  . mouth rinse  15 mL Mouth Rinse BID   Continuous Infusions: . doxycycline (VIBRAMYCIN) IV 100 mg (03/29/18 0146)  . lactated ringers 125 mL/hr at 03/29/18 1043  . meropenem (MERREM) IV    .  sodium bicarbonate  infusion 1000 mL 50 mL/hr at 03/29/18 1002   PRN Meds:.technetium TC 1M diethylenetriame-pentaacetic acid  CBC Recent Labs  Lab 03/27/18 1654 03/27/18 2217 03/28/18 0108 03/29/18 0549  WBC 15.3* 13.7* 14.0* 12.8*  NEUTROABS 12.7*  --  11.1* 9.6*  HGB 11.7* 10.4* 11.1* 8.9*  HCT 37.4 31.8* 33.7* 27.1*  MCV 99.5 96.4 98.0 96.1  PLT 223 206 220 215   Basic Metabolic Panel Recent Labs  Lab 03/27/18 1654 03/27/18 2217 03/28/18 0108 03/28/18 0733 03/29/18 0549  NA 135  --  135 135 137  K 4.4  --  4.3 3.8 3.6  CL 99  --  104 108 104  CO2 19*  --  17* 15* 18*  GLUCOSE 132*  --  132* 112* 105*  BUN 62*  --  61* 60* 63*  CREATININE 9.80* 8.74* 8.74* 8.48* 9.36*  CALCIUM 8.0*  --  7.6* 7.2* 7.9*    Physical Exam  Blood pressure 109/74, pulse 75, temperature 100.1 F (37.8 C), temperature source Oral, resp. rate 12, height 5\' 5"  (1.651 m), weight 100.2 kg, SpO2 97 %. GEN: Chronically ill-appearing, no acute distress ENT: NCAT EYES: EOMI CV: RRR, normal S1-S2 PULM: Scattered expiratory wheezing, normal work of breathing, no crackles ABD: Soft, nontender SKIN: No rashes or lesions EXT: Trace lower extremity edema  Assessment 44F witn oliguric AKI from rhabdomyolysis after being down for 5d  1. Oliguric AKI from rhabdomyolysis and likely resultant ATN 2. Rhabdomyolysis after being in bed x5d 3. ESBL E. Coli UTI, ? Pyelonephritis, on meropenem 4. Metabolic Acidosis, inc AG, from #1 5. Anemia, MCV 96,   Plan  Cont hydration, seems might be opening up urine output a little bit  No  hemodialysis indicated at the current time  Can use furosemide, would use 120 mg IV as needed if develops symptoms of pulmonary edema  BID labs  Daily weights, Daily Renal Panel, Strict I/Os, Avoid nephrotoxins (NSAIDs, judicious IV Contrast)    Sabra Heckyan Sanford MD [phone] pgr 03/29/2018, 11:29 AM

## 2018-03-30 LAB — COMPREHENSIVE METABOLIC PANEL
ALT: 148 U/L — ABNORMAL HIGH (ref 0–44)
AST: 228 U/L — ABNORMAL HIGH (ref 15–41)
Albumin: 1.9 g/dL — ABNORMAL LOW (ref 3.5–5.0)
Alkaline Phosphatase: 32 U/L — ABNORMAL LOW (ref 38–126)
Anion gap: 10 (ref 5–15)
BUN: 64 mg/dL — ABNORMAL HIGH (ref 6–20)
CO2: 19 mmol/L — AB (ref 22–32)
Calcium: 8 mg/dL — ABNORMAL LOW (ref 8.9–10.3)
Chloride: 103 mmol/L (ref 98–111)
Creatinine, Ser: 9.42 mg/dL — ABNORMAL HIGH (ref 0.44–1.00)
GFR calc Af Amer: 5 mL/min — ABNORMAL LOW (ref >60)
GFR calc non Af Amer: 4 mL/min — ABNORMAL LOW (ref >60)
Glucose, Bld: 94 mg/dL (ref 70–99)
Potassium: 3.5 mmol/L (ref 3.5–5.1)
SODIUM: 132 mmol/L — AB (ref 135–145)
Total Bilirubin: 0.8 mg/dL (ref 0.3–1.2)
Total Protein: 5.5 g/dL — ABNORMAL LOW (ref 6.5–8.1)

## 2018-03-30 LAB — BASIC METABOLIC PANEL
ANION GAP: 13 (ref 5–15)
BUN: 65 mg/dL — ABNORMAL HIGH (ref 6–20)
CO2: 19 mmol/L — ABNORMAL LOW (ref 22–32)
Calcium: 8.4 mg/dL — ABNORMAL LOW (ref 8.9–10.3)
Chloride: 102 mmol/L (ref 98–111)
Creatinine, Ser: 9.37 mg/dL — ABNORMAL HIGH (ref 0.44–1.00)
GFR calc non Af Amer: 4 mL/min — ABNORMAL LOW (ref >60)
GFR, EST AFRICAN AMERICAN: 5 mL/min — AB (ref >60)
Glucose, Bld: 125 mg/dL — ABNORMAL HIGH (ref 70–99)
Potassium: 3.8 mmol/L (ref 3.5–5.1)
Sodium: 134 mmol/L — ABNORMAL LOW (ref 135–145)

## 2018-03-30 LAB — CBC WITH DIFFERENTIAL/PLATELET
Abs Immature Granulocytes: 0.12 10*3/uL — ABNORMAL HIGH (ref 0.00–0.07)
Basophils Absolute: 0 10*3/uL (ref 0.0–0.1)
Basophils Relative: 0 %
EOS ABS: 0.2 10*3/uL (ref 0.0–0.5)
Eosinophils Relative: 2 %
HCT: 26.7 % — ABNORMAL LOW (ref 36.0–46.0)
Hemoglobin: 8.8 g/dL — ABNORMAL LOW (ref 12.0–15.0)
Immature Granulocytes: 1 %
Lymphocytes Relative: 18 %
Lymphs Abs: 1.9 10*3/uL (ref 0.7–4.0)
MCH: 31.3 pg (ref 26.0–34.0)
MCHC: 33 g/dL (ref 30.0–36.0)
MCV: 95 fL (ref 80.0–100.0)
Monocytes Absolute: 1.1 10*3/uL — ABNORMAL HIGH (ref 0.1–1.0)
Monocytes Relative: 10 %
Neutro Abs: 7.5 10*3/uL (ref 1.7–7.7)
Neutrophils Relative %: 69 %
Platelets: 186 10*3/uL (ref 150–400)
RBC: 2.81 MIL/uL — ABNORMAL LOW (ref 3.87–5.11)
RDW: 12.7 % (ref 11.5–15.5)
WBC: 10.9 10*3/uL — ABNORMAL HIGH (ref 4.0–10.5)
nRBC: 0 % (ref 0.0–0.2)

## 2018-03-30 MED ORDER — SODIUM CHLORIDE 0.9% FLUSH: 10.0000 mL | INTRAVENOUS | Status: DC | PRN

## 2018-03-30 MED ORDER — TRAMADOL HCL 50 MG PO TABS
50.0000 mg | ORAL_TABLET | Freq: Three times a day (TID) | ORAL | Status: DC | PRN
  Administered 2018-03-30 – 2018-04-02 (×5): 50 mg via ORAL
  Filled 2018-03-30 (×6): qty 1

## 2018-03-30 MED ORDER — ACETAMINOPHEN 325 MG PO TABS
650.0000 mg | ORAL_TABLET | Freq: Once | ORAL | Status: DC
  Filled 2018-03-30: qty 2

## 2018-03-30 MED ORDER — SODIUM BICARBONATE 650 MG PO TABS
650.0000 mg | ORAL_TABLET | Freq: Three times a day (TID) | ORAL | Status: DC
  Administered 2018-03-30 – 2018-03-31 (×4): 650 mg via ORAL
  Filled 2018-03-30 (×4): qty 1

## 2018-03-30 MED ORDER — SODIUM CHLORIDE 0.9% FLUSH
10.0000 mL | Freq: Two times a day (BID) | INTRAVENOUS | Status: DC
  Administered 2018-03-31: 10 mL

## 2018-03-30 MED ORDER — LACTATED RINGERS IV SOLN
INTRAVENOUS | Status: DC
  Administered 2018-03-30 – 2018-03-31 (×2): via INTRAVENOUS

## 2018-03-30 NOTE — Progress Notes (Signed)
PT Progress Note for Charges    03/30/18 1000  PT General Charges  $$ ACUTE PT VISIT 1 Visit  PT Evaluation  $PT Eval Moderate Complexity 1 Mod  PT Treatments  $Gait Training 8-22 mins

## 2018-03-30 NOTE — Progress Notes (Signed)
Informed Dr. Thedore MinsSingh that patient is requesting tramadol for pain after working with physical therapy. MD placed verbal order for 50mg  PO tramadol every 8 hours as needed for pain.

## 2018-03-30 NOTE — Progress Notes (Signed)
PROGRESS NOTE                                                                                                                                                                                                             Patient Demographics:    Jeanne Leach, is a 52 y.o. female, DOB - 1965-06-02, ZOX:[id number]  Admit date - 03/27/2018   Admitting Physician Eduard Clos, MD  Outpatient Primary MD for the patient is Jearld Lesch, MD  LOS - 3  Chief Complaint  Patient presents with  . Respiratory Distress       Brief Narrative - Jeanne Leach is a 52 y.o. female with history of hypertension, chronic pain, anxiety, chronic kidney disease, chronic anemia was found to be weak and drowsy by patient's family.  As per the ER physician and the patient patient's family had come to check on her today to take her for dinner with her other family members when patient was found to be drowsy weak and unable to ambulate because of the weakness.  EMS was called patient was found to be hypoxic weak hypotensive was given fluid bolus and brought to the ER, the ER work-up was suggestive of sepsis due to pyelonephritis along with possible narcotic and benzo overdose.   Subjective:   Patient in bed, appears comfortable, denies any headache, no fever, no chest pain or pressure, no shortness of breath , no abdominal pain. No focal weakness.    Assessment  & Plan :      1.  Toxic & Metabolic Encephalopathy due to combination of ARF, sepsis caused by pyelonephritis along with prescription drug abuse.  Treated conservatively, offending medications were held, she has been placed on antibiotics, being hydrated with IV fluids, mentation has improved.  2.  ARF.  Due to combination of rhabdomyolysis along with severe dehydration and pyelonephritis.  No hydronephrosis on CT scan, she has had a Foley catheter placed in the ER which I will continue to monitor intake and output strictly, she had  an unremarkable renal ultrasound, nephrology is following she is being hydrated, good urine output, discussed with nephrologist Dr. Marisue Humble on 03/30/2018, continue hydration and monitor.  3.  Possible pneumonia on chest x-ray.  On meropenem and doxycycline combination clinically stable.  4.  Acute hypoxic and hypercapnic respiratory failure.  Likely due to poor mentation along with drug overdose and possible mild pneumonia.  Initially required BiPAP now much improved, continue present line of care and monitor.  Currently  on room air.  5.  Severe metabolic acidosis.  Due to combination of sepsis and renal failure, will place on low-dose bicarb drip and monitor.  6.  Anemia of chronic disease.  Monitor.  7.  Elevated LFTs likely due to shock liver from hypotension and dehydration.  Hydrate and monitor trend improving.  INR is 1.1 suggesting good synthetic function.  8.  Elevated d-dimer.  Due to infection.  VQ scan ordered upon admission has been done and results are pending will monitor clinical suspicion for PE is extremely low.  9.  Prescription drug abuse.  Counseled.  Urine drug screen ordered as well.  Tylenol and salicylate levels are stable.  10.  ESBL pyelonephritis.  Currently on meropenem which will be started on 03/29/2018 morning.    Family Communication  :  None  Code Status :  Full  Disposition Plan  :  Stepdown  Consults  :  None  Procedures  :    CT - 1. No CT evidence for nephrolithiasis or obstructive uropathy. 2. Mild hazy perinephric fat stranding about the kidneys bilaterally, right greater than left. Findings are nonspecific, but could reflect sequelae of acute upper urinary tract infection/pyelonephritis. Correlation with urinalysis recommended. Mild circumferential bladder wall thickening may be related to incomplete distension and/or concomitant cystitis. 3. Patchy multifocal nodular tree-in-bud opacities within the visualized lung bases, suspicious for acute  endobronchial infection/small airways disease. 4. No other acute abnormality within the abdomen and pelvis.  DVT Prophylaxis  :   Heparin    Lab Results  Component Value Date   PLT 186 03/30/2018    Diet :  Diet Order            Diet Heart Room service appropriate? Yes; Fluid consistency: Thin  Diet effective now               Inpatient Medications Scheduled Meds: . acetaminophen  650 mg Oral Once  . heparin injection (subcutaneous)  5,000 Units Subcutaneous Q8H  . mouth rinse  15 mL Mouth Rinse BID   Continuous Infusions: . doxycycline (VIBRAMYCIN) IV 100 mg (03/30/18 0153)  . lactated ringers    . meropenem (MERREM) IV 1 g (03/29/18 1207)   PRN Meds:.technetium TC 75M diethylenetriame-pentaacetic acid  Antibiotics  :   Anti-infectives (From admission, onward)   Start     Dose/Rate Route Frequency Ordered Stop   03/29/18 1200  meropenem (MERREM) 1 g in sodium chloride 0.9 % 100 mL IVPB     1 g 200 mL/hr over 30 Minutes Intravenous Every 24 hours 03/29/18 1101     03/28/18 1700  ceFEPIme (MAXIPIME) 1 g in sodium chloride 0.9 % 100 mL IVPB  Status:  Discontinued     1 g 200 mL/hr over 30 Minutes Intravenous Every 24 hours 03/27/18 2154 03/28/18 1303   03/28/18 1400  cefTRIAXone (ROCEPHIN) 1 g in sodium chloride 0.9 % 100 mL IVPB  Status:  Discontinued     1 g 200 mL/hr over 30 Minutes Intravenous Every 24 hours 03/28/18 1303 03/29/18 1036   03/28/18 1400  doxycycline (VIBRAMYCIN) 100 mg in sodium chloride 0.9 % 250 mL IVPB     100 mg 125 mL/hr over 120 Minutes Intravenous Every 12 hours 03/28/18 1307     03/28/18 0200  metroNIDAZOLE (FLAGYL) IVPB 500 mg  Status:  Discontinued     500 mg 100 mL/hr over 60 Minutes Intravenous Every 8 hours 03/27/18 2139 03/28/18 1303   03/27/18 2154  vancomycin variable  dose per unstable renal function (pharmacist dosing)  Status:  Discontinued      Does not apply See admin instructions 03/27/18 2154 03/28/18 1303   03/27/18 2145   ceFEPIme (MAXIPIME) 2 g in sodium chloride 0.9 % 100 mL IVPB  Status:  Discontinued     2 g 200 mL/hr over 30 Minutes Intravenous  Once 03/27/18 2139 03/27/18 2145   03/27/18 2145  vancomycin (VANCOCIN) IVPB 1000 mg/200 mL premix  Status:  Discontinued     1,000 mg 200 mL/hr over 60 Minutes Intravenous  Once 03/27/18 2139 03/27/18 2146   03/27/18 1715  metroNIDAZOLE (FLAGYL) IVPB 500 mg     500 mg 100 mL/hr over 60 Minutes Intravenous  Once 03/27/18 1705 03/27/18 1856   03/27/18 1700  ceFEPIme (MAXIPIME) 2 g in sodium chloride 0.9 % 100 mL IVPB     2 g 200 mL/hr over 30 Minutes Intravenous  Once 03/27/18 1654 03/27/18 1804   03/27/18 1700  vancomycin (VANCOCIN) 2,000 mg in sodium chloride 0.9 % 500 mL IVPB     2,000 mg 250 mL/hr over 120 Minutes Intravenous  Once 03/27/18 1654 03/27/18 2008          Objective:   Vitals:   03/30/18 0045 03/30/18 0339 03/30/18 0350 03/30/18 0811  BP:  116/78    Pulse:  69    Resp:  12    Temp: 99.1 F (37.3 C)  98.7 F (37.1 C) 98.7 F (37.1 C)  TempSrc: Axillary  Oral Oral  SpO2:      Weight:      Height:        Wt Readings from Last 3 Encounters:  03/28/18 100.2 kg  05/16/15 90.7 kg  10/13/14 86.2 kg     Intake/Output Summary (Last 24 hours) at 03/30/2018 1109 Last data filed at 03/30/2018 0900 Gross per 24 hour  Intake 3195.12 ml  Output 2000 ml  Net 1195.12 ml     Physical Exam  Awake Alert, Oriented X 3, No new F.N deficits, Normal affect [place].AT,PERRAL Supple Neck,No JVD, No cervical lymphadenopathy appriciated.  Symmetrical Chest wall movement, Good air movement bilaterally, CTAB RRR,No Gallops, Rubs or new Murmurs, No Parasternal Heave +ve B.Sounds, Abd Soft, No tenderness, No organomegaly appriciated, No rebound - guarding or rigidity. No Cyanosis, Clubbing, 1+ edema, No new Rash or bruise    Data Review:    CBC Recent Labs  Lab 03/27/18 1654 03/27/18 2217 03/28/18 0108 03/29/18 0549 03/30/18 0432  WBC  15.3* 13.7* 14.0* 12.8* 10.9*  HGB 11.7* 10.4* 11.1* 8.9* 8.8*  HCT 37.4 31.8* 33.7* 27.1* 26.7*  PLT 223 206 220 215 186  MCV 99.5 96.4 98.0 96.1 95.0  MCH 31.1 31.5 32.3 31.6 31.3  MCHC 31.3 32.7 32.9 32.8 33.0  RDW 12.9 12.9 13.1 13.1 12.7  LYMPHSABS 1.2  --  1.6 1.7 1.9  MONOABS 1.2*  --  1.2* 1.3* 1.1*  EOSABS 0.2  --  0.0 0.1 0.2  BASOSABS 0.0  --  0.0 0.0 0.0    Chemistries  Recent Labs  Lab 03/27/18 1654  03/28/18 0108 03/28/18 0733 03/29/18 0549 03/29/18 1612 03/30/18 0432  NA 135  --  135 135 137 136 132*  K 4.4  --  4.3 3.8 3.6 3.9 3.5  CL 99  --  104 108 104 105 103  CO2 19*  --  17* 15* 18* 17* 19*  GLUCOSE 132*  --  132* 112* 105* 114* 94  BUN 62*  --  61* 60* 63* 64* 64*  CREATININE 9.80*   < > 8.74* 8.48* 9.36* 9.15* 9.42*  CALCIUM 8.0*  --  7.6* 7.2* 7.9* 8.2* 8.0*  MG  --   --   --   --  1.9  --   --   AST 575*  --  599* 449* 303*  --  228*  ALT 254*  --  258* 210* 181*  --  148*  ALKPHOS 41  --  38 33* 35*  --  32*  BILITOT 0.3  --  0.5 0.5 0.7  --  0.8   < > = values in this interval not displayed.   ------------------------------------------------------------------------------------------------------------------ No results for input(s): CHOL, HDL, LDLCALC, TRIG, CHOLHDL, LDLDIRECT in the last 72 hours.  Lab Results  Component Value Date   HGBA1C 6.3 (H) 08/08/2014   ------------------------------------------------------------------------------------------------------------------ Recent Labs    03/27/18 2217  TSH 3.195   ------------------------------------------------------------------------------------------------------------------ Recent Labs    03/28/18 0733  VITAMINB12 207  FOLATE 9.9  FERRITIN 418*  TIBC 151*  IRON 17*  RETICCTPCT 1.3    Coagulation profile Recent Labs  Lab 03/27/18 2217 03/28/18 0733  INR 1.16 1.21    Recent Labs    03/27/18 2217  DDIMER 2.31*    Cardiac Enzymes Recent Labs  Lab 03/27/18 2217  03/28/18 0733 03/28/18 1140  TROPONINI 0.03* 0.03* 0.03*   ------------------------------------------------------------------------------------------------------------------    Component Value Date/Time   BNP 173.1 (H) 03/29/2018 0549    Micro Results Recent Results (from the past 240 hour(s))  Blood culture (routine x 2)     Status: None (Preliminary result)   Collection Time: 03/27/18  5:05 PM  Result Value Ref Range Status   Specimen Description SITE NOT SPECIFIED  Final   Special Requests   Final    BOTTLES DRAWN AEROBIC AND ANAEROBIC Blood Culture adequate volume   Culture   Final    NO GROWTH 3 DAYS Performed at [facility], [address], [place], [place] [address]    Report Status PENDING  Incomplete  Urine culture     Status: Abnormal   Collection Time: 03/27/18  5:12 PM  Result Value Ref Range Status   Specimen Description URINE, RANDOM  Final   Special Requests   Final    NONE Performed at [facility], [address] [place], [place] [address]    Culture (A)  Final    >=100,000 COLONIES/mL ESCHERICHIA COLI Confirmed Extended Spectrum Beta-Lactamase Producer (ESBL).  In bloodstream infections from ESBL organisms, carbapenems are preferred over piperacillin/tazobactam. They are shown to have a lower risk of mortality.    Report Status 03/29/2018 FINAL  Final   Organism ID, Bacteria ESCHERICHIA COLI (A)  Final      Susceptibility   Escherichia coli - MIC*    AMPICILLIN >=32 RESISTANT Resistant     CEFAZOLIN >=64 RESISTANT Resistant     CEFTRIAXONE >=64 RESISTANT Resistant     CIPROFLOXACIN >=4 RESISTANT Resistant     GENTAMICIN >=16 RESISTANT Resistant     IMIPENEM <=0.25 SENSITIVE Sensitive     NITROFURANTOIN <=16 SENSITIVE Sensitive     TRIMETH/SULFA <=20 SENSITIVE Sensitive     AMPICILLIN/SULBACTAM >=32 RESISTANT Resistant     PIP/TAZO <=4 SENSITIVE Sensitive     Extended ESBL POSITIVE Resistant     * >=100,000 COLONIES/mL ESCHERICHIA COLI   Blood culture (routine x 2)     Status: None (Preliminary result)   Collection Time: 03/27/18  5:15 PM  Result Value Ref Range Status   Specimen Description SITE NOT SPECIFIED  Final   Special Requests   Final    BOTTLES DRAWN AEROBIC AND ANAEROBIC Blood Culture results may not be optimal due to an inadequate volume of blood received in culture bottles   Culture   Final    NO GROWTH 3 DAYS Performed at [facility], [address], [place], [place] [address]    Report Status PENDING  Incomplete  MRSA PCR Screening     Status: None   Collection Time: 03/27/18  9:53 PM  Result Value Ref Range Status   MRSA by PCR NEGATIVE NEGATIVE Final    Comment:        The GeneXpert MRSA Assay (FDA approved for NASAL specimens only), is one component of a comprehensive MRSA colonization surveillance program. It is not intended to diagnose MRSA infection nor to guide or monitor treatment for MRSA infections. Performed at [facility], [address], [place], [place] [address]   Culture, sputum-assessment     Status: None   Collection Time: 03/28/18  7:43 PM  Result Value Ref Range Status   Specimen Description EXPECTORATED SPUTUM  Final   Special Requests NONE  Final   Sputum evaluation   Final    THIS SPECIMEN IS ACCEPTABLE FOR SPUTUM CULTURE Performed at [facility], [address], [place], [place] [address]    Report Status 03/29/2018 FINAL  Final  Culture, respiratory     Status: None (Preliminary result)   Collection Time: 03/28/18  7:43 PM  Result Value Ref Range Status   Specimen Description EXPECTORATED SPUTUM  Final   Special Requests NONE Reflexed from [id number]  Final   Gram Stain   Final    ABUNDANT WBC PRESENT,BOTH PMN AND MONONUCLEAR MODERATE GRAM VARIABLE ROD ABUNDANT YEAST MODERATE SQUAMOUS EPITHELIAL CELLS PRESENT    Culture   Final    FEW YEAST CULTURE REINCUBATED FOR BETTER GROWTH Performed at [facility], [address],  [place], [place] [address]    Report Status PENDING  Incomplete    Radiology Reports Ct Head Wo Contrast  Result Date: 03/28/2018 CLINICAL DATA:  Altered mental status EXAM: CT HEAD WITHOUT CONTRAST TECHNIQUE: Contiguous axial images were obtained from the base of the skull through the vertex without intravenous contrast. COMPARISON:  Head CT 10/04/2015 FINDINGS: Brain: There is no mass, hemorrhage or extra-axial collection. The size and configuration of the ventricles and extra-axial CSF spaces are normal. The brain parenchyma is normal, without evidence of acute or chronic infarction. Vascular: No abnormal hyperdensity of the major intracranial arteries or dural venous sinuses. No intracranial atherosclerosis. Skull: The visualized skull base, calvarium and extracranial soft tissues are normal. Sinuses/Orbits: No fluid levels or advanced mucosal thickening of the visualized paranasal sinuses. No mastoid or middle ear effusion. The orbits are normal. IMPRESSION: Normal head CT. Electronically Signed   By: Deatra Robinson M.D.   On: 03/28/2018 06:01   Nm Pulmonary Perfusion  Result Date: 03/28/2018 CLINICAL DATA:  Acute respiratory failure with hypoxia. Droplet precautions for influenza. EXAM: NUCLEAR MEDICINE PERFUSION LUNG SCAN TECHNIQUE: Perfusion images were obtained in multiple projections after intravenous injection of radiopharmaceutical. RADIOPHARMACEUTICALS:  4 millicurie mCi Tc-39m MAA IV COMPARISON:  03/27/2018 FINDINGS: The perfusion lung scan appears normal. No perfusion defect is identified to suggest pulmonary embolus. IMPRESSION: 1. Normal-appearing perfusion lung scan. No perfusion defect to suggest pulmonary embolus. 2. Because the perfusion images were normal, and  because the patient is on droplet precautions, I felt that ventilation images would be problematic to obtain and not helpful for diagnosis, and accordingly today's exam is performed as a perfusion lung scan only rather than a  ventilation-perfusion scan. Electronically Signed   By: Gaylyn RongWalter  Liebkemann M.D.   On: 03/28/2018 13:09   [place] Renal  Result Date: 03/28/2018 CLINICAL DATA:  52 y/o  F; acute renal failure. EXAM: RENAL / URINARY TRACT ULTRASOUND COMPLETE COMPARISON:  03/27/2018 CT abdomen and pelvis. FINDINGS: Right Kidney: Renal measurements: 11.4 x 6.3 x 5.9 cm = volume: 222 mL . Echogenicity within normal limits. No mass or hydronephrosis visualized. Prominent interpolar kidney Bertin column. Left Kidney: Renal measurements: 12.1 x 6.1 x 4.6 cm = volume: 293 mL. Echogenicity within normal limits. No mass or hydronephrosis visualized. Bladder: Appears normal for degree of bladder distention. Foley catheter in situ. IMPRESSION: Normal renal ultrasound. Electronically Signed   By: Mitzi HansenLance  Furusawa-Stratton M.D.   On: 03/28/2018 16:00   Dg Chest Port 1 View  Result Date: 03/29/2018 CLINICAL DATA:  New onset persistent cough this morning. Acute kidney injury. Acute respiratory failure with hypoxia. EXAM: PORTABLE CHEST 1 VIEW COMPARISON:  Radiographs 11/03/2014 and 03/27/2018. Chest CT 10/04/2015. Abdominal CT 03/27/2018. FINDINGS: 0749 hours. The heart size and mediastinal contours are normal. There are low lung volumes with mild interstitial prominence in both lung bases, similar to recent prior study. The pulmonary vascularity is normal. There is no confluent airspace opacity, pleural effusion or pneumothorax. The bones appear unchanged. IMPRESSION: Interstitial prominence at both lung bases, similar to recent prior study and likely due to a combination of atelectasis and peribronchial inflammation as correlated with recent abdominal CT. No consolidation or significant pleural effusion. Electronically Signed   By: Carey BullocksWilliam  Veazey M.D.   On: 03/29/2018 08:18   Dg Chest Portable 1 View  Result Date: 03/27/2018 CLINICAL DATA:  Severe cough hypoxia EXAM: PORTABLE CHEST 1 VIEW COMPARISON:  CT 10/04/2015, radiograph 11/03/2014  FINDINGS: No focal consolidation or pleural effusion. Stable cardiomediastinal silhouette. No pneumothorax. IMPRESSION: No active disease. Electronically Signed   By: Jasmine PangKim  Fujinaga M.D.   On: 03/27/2018 17:54   Ct Renal Stone Study  Result Date: 03/27/2018 CLINICAL DATA:  Initial evaluation for acute bilateral flank pain. EXAM: CT ABDOMEN AND PELVIS WITHOUT CONTRAST TECHNIQUE: Multidetector CT imaging of the abdomen and pelvis was performed following the standard protocol without IV contrast. COMPARISON:  Prior CT from 10/04/2015. FINDINGS: Lower chest: Dependent atelectatic changes noted within the visualized lung bases. Few additional scattered tree-in-bud nodular densities noted, nonspecific, but suggestive of possible endobronchial infection/small airways disease. Hepatobiliary: Liver demonstrates a normal unenhanced appearance. Gallbladder within normal limits. No biliary dilatation. Pancreas: Mild diffuse fatty infiltration the pancreas noted. Pancreas otherwise unremarkable without acute inflammatory changes. Spleen: Spleen within normal limits. Adrenals/Urinary Tract: Adrenal glands are normal. Kidneys equal in size. No nephrolithiasis or hydronephrosis. No radiopaque calculi seen along the course of either renal collecting system. No hydroureter. Subtle hazy perinephric fat stranding seen about the kidneys bilaterally, right slightly greater than left, raising the possibility for possible upper urinary tract infection. No perinephric collections seen on this noncontrast examination. No layering stones within the bladder lumen. Mild circumferential wall thickening about the bladder. Stomach/Bowel: Stomach within normal limits. No evidence for bowel obstruction. Normal appendix. No acute inflammatory changes seen about the bowels. Vascular/Lymphatic: Mild aorto bi-iliac atherosclerotic disease. No aneurysm. No adenopathy. Reproductive: Uterus and ovaries within normal limits. Other: No free air or fluid.  Small fat containing paraumbilical hernia noted. Musculoskeletal: No acute osseous abnormality. No worrisome lytic or blastic osseous lesions. Mild multilevel facet arthropathy noted within the lumbar spine. IMPRESSION: 1. No CT evidence for nephrolithiasis or obstructive uropathy. 2. Mild hazy perinephric fat stranding about the kidneys bilaterally, right greater than left. Findings are nonspecific, but could reflect sequelae of acute upper urinary tract infection/pyelonephritis. Correlation with urinalysis recommended. Mild circumferential bladder wall thickening may be related to incomplete distension and/or concomitant cystitis. 3. Patchy multifocal nodular tree-in-bud opacities within the visualized lung bases, suspicious for acute endobronchial infection/small airways disease. 4. No other acute abnormality within the abdomen and pelvis. Electronically Signed   By: Rise Mu M.D.   On: 03/27/2018 20:09    Time Spent in minutes  30   Susa Raring M.D on 03/30/2018 at 11:09 AM  To page go to [email or web address] - password [email or web address]

## 2018-03-30 NOTE — Progress Notes (Signed)
Per IV team two people were unable to get IV access and requested an order for a midline. Informed Dr. Thedore MinsSingh of this request. Per MD ok to place midline.

## 2018-03-30 NOTE — Progress Notes (Signed)
RT placed pt on BIPAP for the night. Pt resting comfortably. RT will continue to monitor. 

## 2018-03-30 NOTE — Evaluation (Signed)
Physical Therapy Evaluation Patient Details Name: Jeanne HinesDeborah Castoro MRN: [id number] DOB: 05/06/1965 Today's Date: 03/30/2018   History of Present Illness  Pt is a 52 y.o. female with a PMH consisting of HTN, CKD, Bipolar, and chronic pain presents to the ED with weakness and AMS. Pt had spent the previous 5 days laying in bed due to the weakness. Pt found to have toxic & metabolic encephalopathy, and rhabdomyolysis.  Clinical Impression   Pt presents supine, HOB elevated, and alert. Pt states willingness to participate in PT. Prior to admission, pt states independence with all activity, including ADL's and ambulation. Pt lives with her husband who is able to provide care 24/7. Pt lives in a 1 story home with 2 steps to enter. Pt is overall min guard with both bed mobility and ambulation, and mod A with transfers. Pt states decreased walking speed with ambulation, but denies any instances of lightheadedness or dizziness. Pt also states continued generalized weakness, and attributes it to laying in bed for multiple days before the hospitalization. Pt would benefit from continued PT in order to increased strength, balance and functional mobility.     Follow Up Recommendations Home health PT;Supervision/Assistance - 24 hour    Equipment Recommendations  Rolling walker with 5" wheels    Recommendations for Other Services       Precautions / Restrictions Precautions Precautions: Fall Restrictions Weight Bearing Restrictions: No      Mobility  Bed Mobility Overal bed mobility: Needs Assistance Bed Mobility: Supine to Sit     Supine to sit: Min guard     General bed mobility comments: for safety  Transfers Overall transfer level: Needs assistance Equipment used: Rolling walker (2 wheeled) Transfers: Sit to/from Stand Sit to Stand: Mod assist         General transfer comment: Pt required mod A for power initiation and stability. Pt requires use of BUE for push off and support once  standing. VC given for BUE placement.   Ambulation/Gait Ambulation/Gait assistance: Min guard Gait Distance (Feet): 70 Feet Assistive device: Rolling walker (2 wheeled) Gait Pattern/deviations: Decreased stride length;Decreased dorsiflexion - right;Decreased dorsiflexion - left;Trunk flexed     General Gait Details: Pt demonstrated decreased gait speed during ambulation. Pt denies any feeling of lightheadedness or dizziness. Pt requires cueing throughout for maintaining body positioning within the frame of the walker.   Stairs            Wheelchair Mobility    Modified Rankin (Stroke Patients Only)       Balance Overall balance assessment: Needs assistance Sitting-balance support: No upper extremity supported;Feet supported Sitting balance-Leahy Scale: Fair     Standing balance support: Bilateral upper extremity supported;During functional activity Standing balance-Leahy Scale: Poor Standing balance comment: requires BUE support to maintain balance                             Pertinent Vitals/Pain Pain Assessment: Faces Faces Pain Scale: Hurts even more Pain Location: RLE Pain Descriptors / Indicators: Constant;Aching;Burning Pain Intervention(s): Monitored during session    Home Living Family/patient expects to be discharged to:: Private residence Living Arrangements: Spouse/significant other Available Help at Discharge: Family;Available 24 hours/day Type of Home: House Home Access: Stairs to enter Entrance Stairs-Rails: None Entrance Stairs-Number of Steps: 2 Home Layout: One level Home Equipment: Cane - single point      Prior Function Level of Independence: Independent  Hand Dominance        Extremity/Trunk Assessment   Upper Extremity Assessment Upper Extremity Assessment: Overall WFL for tasks assessed    Lower Extremity Assessment Lower Extremity Assessment: Generalized weakness       Communication    Communication: No difficulties  Cognition Arousal/Alertness: Awake/alert Behavior During Therapy: WFL for tasks assessed/performed Overall Cognitive Status: Within Functional Limits for tasks assessed Area of Impairment: Following commands;Problem solving                       Following Commands: Follows one step commands consistently;Follows one step commands with increased time     Problem Solving: Slow processing;Difficulty sequencing        General Comments      Exercises     Assessment/Plan    PT Assessment Patient needs continued PT services  PT Problem List Decreased strength;Decreased activity tolerance;Decreased balance;Decreased mobility;Decreased coordination;Decreased knowledge of use of DME;Decreased safety awareness       PT Treatment Interventions DME instruction;Gait training;Stair training;Functional mobility training;Therapeutic activities;Therapeutic exercise;Balance training;Neuromuscular re-education    PT Goals (Current goals can be found in the Care Plan section)  Acute Rehab PT Goals Patient Stated Goal: to return home PT Goal Formulation: With patient Time For Goal Achievement: 04/13/18 Potential to Achieve Goals: Good    Frequency Min 3X/week   Barriers to discharge        Co-evaluation               AM-PAC PT "6 Clicks" Mobility  Outcome Measure Help needed turning from your back to your side while in a flat bed without using bedrails?: None Help needed moving from lying on your back to sitting on the side of a flat bed without using bedrails?: None Help needed moving to and from a bed to a chair (including a wheelchair)?: A Little Help needed standing up from a chair using your arms (e.g., wheelchair or bedside chair)?: A Lot Help needed to walk in hospital room?: A Little Help needed climbing 3-5 steps with a railing? : A Lot 6 Click Score: 18    End of Session Equipment Utilized During Treatment: Gait belt Activity  Tolerance: Patient tolerated treatment well Patient left: in chair;with call bell/phone within reach;with chair alarm set;with family/visitor present Nurse Communication: Mobility status PT Visit Diagnosis: Unsteadiness on feet (R26.81);Other abnormalities of gait and mobility (R26.89);Muscle weakness (generalized) (M62.81)    Time: 1610-9604 PT Time Calculation (min) (ACUTE ONLY): 30 min   Charges:   PT Evaluation $PT Eval Moderate Complexity: 1 Mod PT Treatments $Gait Training: 8-22 mins        Rolm Bookbinder, SPT Acute Rehab [phone] (pager) [phone] (office)   Ayme Short 03/30/2018, 10:42 AM

## 2018-03-30 NOTE — Progress Notes (Signed)
Admit: 03/27/2018 LOS: 3  36F witn oliguric AKI from rhabdomyolysis after being down for 5d  Subjective:  . Brisk inc in UOP . SCR flat, lytes ok . Pt w/o c/o   11/28 0701 - 11/29 0700 In: 5305.1 [P.O.:1630; I.V.:2825.1; IV Piggyback:850] Out: 2250 [Urine:2250]  Filed Weights   03/27/18 1641 03/27/18 2145 03/28/18 0437  Weight: 99.8 kg 100.2 kg 100.2 kg    Scheduled Meds: . acetaminophen  650 mg Oral Once  . heparin injection (subcutaneous)  5,000 Units Subcutaneous Q8H  . mouth rinse  15 mL Mouth Rinse BID  . sodium bicarbonate  650 mg Oral TID   Continuous Infusions: . doxycycline (VIBRAMYCIN) IV 100 mg (03/30/18 0153)  . lactated ringers    . meropenem (MERREM) IV 1 g (03/29/18 1207)   PRN Meds:.technetium TC 31M diethylenetriame-pentaacetic acid  Current Labs: reviewed    Physical Exam:  Blood pressure 116/78, pulse 69, temperature 98.7 F (37.1 C), temperature source Oral, resp. rate 12, height 5\' 5"  (1.651 m), weight 100.2 kg, SpO2 98 %. GEN: Chronically ill-appearing, no acute distress, in chair ENT: NCAT EYES: EOMI CV: RRR, normal S1-S2 PULM: Scattered expiratory wheezing, normal work of breathing, no crackles ABD: Soft, nontender SKIN: No rashes or lesions EXT: Trace lower extremity edema  A 1. Oliguric AKI from rhabdomyolysis and likely resultant ATN 2. Rhabdomyolysis after being in bed x5d 3. ESBL E. Coli UTI, ? Pyelonephritis, on meropenem 4. Metabolic Acidosis, inc AG, from #1 5. Anemia, MCV 96,   P  Cont hydration, UOP is opening up   No hemodialysis indicated at the current time  Can use furosemide, would use 120 mg IV as needed if develops symptoms of pulmonary edema  BID labs; trend CK in AM . Medication Issues; o Preferred narcotic agents for pain control are hydromorphone, fentanyl, and methadone. Morphine should not be used.  o Baclofen should be avoided o Avoid oral sodium phosphate and magnesium citrate based laxatives / bowel  preps    Sabra Heckyan Tyqwan Pink MD 03/30/2018, 11:19 AM  Recent Labs  Lab 03/29/18 0549 03/29/18 1612 03/30/18 0432  NA 137 136 132*  K 3.6 3.9 3.5  CL 104 105 103  CO2 18* 17* 19*  GLUCOSE 105* 114* 94  BUN 63* 64* 64*  CREATININE 9.36* 9.15* 9.42*  CALCIUM 7.9* 8.2* 8.0*  PHOS 6.3*  --   --    Recent Labs  Lab 03/28/18 0108 03/29/18 0549 03/30/18 0432  WBC 14.0* 12.8* 10.9*  NEUTROABS 11.1* 9.6* 7.5  HGB 11.1* 8.9* 8.8*  HCT 33.7* 27.1* 26.7*  MCV 98.0 96.1 95.0  PLT 220 215 186

## 2018-03-31 LAB — COMPREHENSIVE METABOLIC PANEL
ALT: 118 U/L — ABNORMAL HIGH (ref 0–44)
AST: 147 U/L — ABNORMAL HIGH (ref 15–41)
Albumin: 1.9 g/dL — ABNORMAL LOW (ref 3.5–5.0)
Alkaline Phosphatase: 36 U/L — ABNORMAL LOW (ref 38–126)
Anion gap: 14 (ref 5–15)
BILIRUBIN TOTAL: 0.6 mg/dL (ref 0.3–1.2)
BUN: 64 mg/dL — AB (ref 6–20)
CO2: 21 mmol/L — ABNORMAL LOW (ref 22–32)
Calcium: 8.3 mg/dL — ABNORMAL LOW (ref 8.9–10.3)
Chloride: 101 mmol/L (ref 98–111)
Creatinine, Ser: 9.36 mg/dL — ABNORMAL HIGH (ref 0.44–1.00)
GFR calc Af Amer: 5 mL/min — ABNORMAL LOW (ref >60)
GFR calc non Af Amer: 4 mL/min — ABNORMAL LOW (ref >60)
Glucose, Bld: 94 mg/dL (ref 70–99)
Potassium: 3.7 mmol/L (ref 3.5–5.1)
Sodium: 136 mmol/L (ref 135–145)
Total Protein: 5.5 g/dL — ABNORMAL LOW (ref 6.5–8.1)

## 2018-03-31 LAB — CBC WITH DIFFERENTIAL/PLATELET
Abs Immature Granulocytes: 0.17 10*3/uL — ABNORMAL HIGH (ref 0.00–0.07)
Basophils Absolute: 0.1 10*3/uL (ref 0.0–0.1)
Basophils Relative: 1 %
EOS ABS: 0.4 10*3/uL (ref 0.0–0.5)
Eosinophils Relative: 4 %
HCT: 26.7 % — ABNORMAL LOW (ref 36.0–46.0)
Hemoglobin: 8.6 g/dL — ABNORMAL LOW (ref 12.0–15.0)
Immature Granulocytes: 2 %
LYMPHS ABS: 2.4 10*3/uL (ref 0.7–4.0)
Lymphocytes Relative: 23 %
MCH: 31.4 pg (ref 26.0–34.0)
MCHC: 32.2 g/dL (ref 30.0–36.0)
MCV: 97.4 fL (ref 80.0–100.0)
Monocytes Absolute: 1.1 10*3/uL — ABNORMAL HIGH (ref 0.1–1.0)
Monocytes Relative: 11 %
Neutro Abs: 6 10*3/uL (ref 1.7–7.7)
Neutrophils Relative %: 59 %
Platelets: 248 10*3/uL (ref 150–400)
RBC: 2.74 MIL/uL — ABNORMAL LOW (ref 3.87–5.11)
RDW: 13 % (ref 11.5–15.5)
WBC: 10 10*3/uL (ref 4.0–10.5)
nRBC: 0 % (ref 0.0–0.2)

## 2018-03-31 LAB — CK: Total CK: 6030 U/L — ABNORMAL HIGH (ref 38–234)

## 2018-03-31 MED ORDER — SODIUM BICARBONATE 650 MG PO TABS
650.0000 mg | ORAL_TABLET | Freq: Two times a day (BID) | ORAL | Status: DC
  Administered 2018-03-31 – 2018-04-01 (×3): 650 mg via ORAL
  Filled 2018-03-31 (×4): qty 1

## 2018-03-31 MED ORDER — LACTATED RINGERS IV SOLN
INTRAVENOUS | Status: DC
  Administered 2018-04-01: via INTRAVENOUS

## 2018-03-31 NOTE — Progress Notes (Addendum)
Admit: 03/27/2018 LOS: 4  14F witn oliguric AKI from rhabdomyolysis after being down for 5d  Subjective:  . No interval events, good UOP . SCR flat, lytes ok . Pt w/o c/o  . No N/V, dysgeusia . Remains on LR  11/29 0701 - 11/30 0700 In: 1175.6 [P.O.:480; I.V.:95.5; IV Piggyback:600.1] Out: -   Filed Weights   03/27/18 1641 03/27/18 2145 03/28/18 0437  Weight: 99.8 kg 100.2 kg 100.2 kg    Scheduled Meds: . acetaminophen  650 mg Oral Once  . heparin injection (subcutaneous)  5,000 Units Subcutaneous Q8H  . mouth rinse  15 mL Mouth Rinse BID  . sodium bicarbonate  650 mg Oral TID  . sodium chloride flush  10-40 mL Intracatheter Q12H   Continuous Infusions: . doxycycline (VIBRAMYCIN) IV 100 mg (03/31/18 0107)  . lactated ringers 125 mL/hr at 03/31/18 0534  . meropenem (MERREM) IV Stopped (03/30/18 1308)   PRN Meds:.sodium chloride flush, technetium TC 3M diethylenetriame-pentaacetic acid, traMADol  Current Labs: reviewed    Physical Exam:  Blood pressure 128/83, pulse 71, temperature 98.6 F (37 C), resp. rate 13, height 5\' 5"  (1.651 m), weight 100.2 kg, SpO2 98 %. GEN: Chronically ill-appearing, no acute distress, in chair ENT: NCAT EYES: EOMI CV: RRR, normal S1-S2 PULM: Scattered expiratory wheezing, normal work of breathing, no crackles ABD: Soft, nontender SKIN: No rashes or lesions EXT: Trace lower extremity edema  A 1. AKI from rhabdomyolysis and likely resultant ATN; UOP improving 2. Rhabdomyolysis after being in bed x5d 3. ESBL E. Coli UTI, ? Pyelonephritis, on meropenem 4. Metabolic Acidosis, inc AG, from #1 5. Anemia, MCV 96,   P . Should recover in time . Cont current management . Will follow closely . OOB, use IS, walking in halls encouraged . Medication Issues; o Preferred narcotic agents for pain control are hydromorphone, fentanyl, and methadone. Morphine should not be used.  o Baclofen should be avoided o Avoid oral sodium phosphate and  magnesium citrate based laxatives / bowel preps    Sabra Heckyan Bellamarie Pflug MD 03/31/2018, 9:46 AM  Recent Labs  Lab 03/29/18 0549  03/30/18 0432 03/30/18 1415 03/31/18 0319  NA 137   < > 132* 134* 136  K 3.6   < > 3.5 3.8 3.7  CL 104   < > 103 102 101  CO2 18*   < > 19* 19* 21*  GLUCOSE 105*   < > 94 125* 94  BUN 63*   < > 64* 65* 64*  CREATININE 9.36*   < > 9.42* 9.37* 9.36*  CALCIUM 7.9*   < > 8.0* 8.4* 8.3*  PHOS 6.3*  --   --   --   --    < > = values in this interval not displayed.   Recent Labs  Lab 03/29/18 0549 03/30/18 0432 03/31/18 0319  WBC 12.8* 10.9* 10.0  NEUTROABS 9.6* 7.5 6.0  HGB 8.9* 8.8* 8.6*  HCT 27.1* 26.7* 26.7*  MCV 96.1 95.0 97.4  PLT 215 186 248

## 2018-03-31 NOTE — Progress Notes (Signed)
PROGRESS NOTE                                                                                                                                                                                                             Patient Demographics:    Jeanne Leach, is a 52 y.o. female, DOB - [birth year], OZH:[id number]  Admit date - 03/27/2018   Admitting Physician Eduard ClosArshad N Kakrakandy, MD  Outpatient Primary MD for the patient is Jeanne Leach, Jeanne M, MD  LOS - 4  Chief Complaint  Patient presents with  . Respiratory Distress       Brief Narrative - Jeanne HinesDeborah Leach is a 52 y.o. female with history of hypertension, chronic pain, anxiety, chronic kidney disease, chronic anemia was found to be weak and drowsy by patient's family.  As per the ER physician and the patient patient's family had come to check on her today to take her for dinner with her other family members when patient was found to be drowsy weak and unable to ambulate because of the weakness.  EMS was called patient was found to be hypoxic weak hypotensive was given fluid bolus and brought to the ER, the ER work-up was suggestive of sepsis due to pyelonephritis along with possible narcotic and benzo overdose.   Subjective:   Patient in bed, appears comfortable, denies any headache, no fever, no chest pain or pressure, no shortness of breath , no abdominal pain. No focal weakness.    Assessment  & Plan :      1.  Toxic & Metabolic Encephalopathy due to combination of ARF, sepsis caused by pyelonephritis along with prescription drug abuse.  Treated conservatively, offending medications were held, she has been placed on antibiotics, being hydrated with IV fluids, mentation has improved.  2.  ARF.  Due to combination of rhabdomyolysis along with severe dehydration and pyelonephritis.  No hydronephrosis on CT scan, she has had a Foley catheter placed in the ER which I will continue to monitor intake and output strictly, she had  an unremarkable renal ultrasound, nephrology is following she is being hydrated, good urine output, discussed with nephrologist Dr. Marisue HumbleSanford on 03/30/2018, continue hydration and monitor.  3.  Possible pneumonia on chest x-ray.  On meropenem and doxycycline combination clinically stable.  4.  Acute hypoxic and hypercapnic respiratory failure.  Likely due to poor mentation along with drug overdose and possible mild pneumonia.  Initially required BiPAP now much improved, continue present line of care and monitor.  Currently  on room air.  5.  Severe metabolic acidosis.  Due to combination of sepsis and renal failure, will place on low-dose bicarb drip and monitor.  6.  Anemia of chronic disease.  Monitor.  7.  Elevated LFTs likely due to shock liver from hypotension and dehydration.  Hydrate and monitor trend improving.  INR is 1.1 suggesting good synthetic function.  8.  Elevated d-dimer.  Due to infection.  VQ scan ordered upon admission has been done and results are pending will monitor clinical suspicion for PE is extremely low.  9.  Prescription drug abuse.  Counseled.  Urine drug screen ordered as well.  Tylenol and salicylate levels are stable.  10.  ESBL pyelonephritis.  Currently on meropenem which will be started on 03/29/2018 morning.    Family Communication  :  None  Code Status :  Full  Disposition Plan  :  Stepdown  Consults  :  None  Procedures  :    CT - 1. No CT evidence for nephrolithiasis or obstructive uropathy. 2. Mild hazy perinephric fat stranding about the kidneys bilaterally, right greater than left. Findings are nonspecific, but could reflect sequelae of acute upper urinary tract infection/pyelonephritis. Correlation with urinalysis recommended. Mild circumferential bladder wall thickening may be related to incomplete distension and/or concomitant cystitis. 3. Patchy multifocal nodular tree-in-bud opacities within the visualized lung bases, suspicious for acute  endobronchial infection/small airways disease. 4. No other acute abnormality within the abdomen and pelvis.  DVT Prophylaxis  :   Heparin    Lab Results  Component Value Date   PLT 248 03/31/2018    Diet :  Diet Order            Diet Heart Room service appropriate? Yes; Fluid consistency: Thin  Diet effective now               Inpatient Medications Scheduled Meds: . acetaminophen  650 mg Oral Once  . heparin injection (subcutaneous)  5,000 Units Subcutaneous Q8H  . mouth rinse  15 mL Mouth Rinse BID  . sodium bicarbonate  650 mg Oral TID  . sodium chloride flush  10-40 mL Intracatheter Q12H   Continuous Infusions: . doxycycline (VIBRAMYCIN) IV 100 mg (03/31/18 0107)  . lactated ringers    . meropenem (MERREM) IV Stopped (03/30/18 1308)   PRN Meds:.sodium chloride flush, technetium TC 7M diethylenetriame-pentaacetic acid, traMADol  Antibiotics  :   Anti-infectives (From admission, onward)   Start     Dose/Rate Route Frequency Ordered Stop   03/29/18 1200  meropenem (MERREM) 1 g in sodium chloride 0.9 % 100 mL IVPB     1 g 200 mL/hr over 30 Minutes Intravenous Every 24 hours 03/29/18 1101     03/28/18 1700  ceFEPIme (MAXIPIME) 1 g in sodium chloride 0.9 % 100 mL IVPB  Status:  Discontinued     1 g 200 mL/hr over 30 Minutes Intravenous Every 24 hours 03/27/18 2154 03/28/18 1303   03/28/18 1400  cefTRIAXone (ROCEPHIN) 1 g in sodium chloride 0.9 % 100 mL IVPB  Status:  Discontinued     1 g 200 mL/hr over 30 Minutes Intravenous Every 24 hours 03/28/18 1303 03/29/18 1036   03/28/18 1400  doxycycline (VIBRAMYCIN) 100 mg in sodium chloride 0.9 % 250 mL IVPB     100 mg 125 mL/hr over 120 Minutes Intravenous Every 12 hours 03/28/18 1307     03/28/18 0200  metroNIDAZOLE (FLAGYL) IVPB 500 mg  Status:  Discontinued  500 mg 100 mL/hr over 60 Minutes Intravenous Every 8 hours 03/27/18 2139 03/28/18 1303   03/27/18 2154  vancomycin variable dose per unstable renal function  (pharmacist dosing)  Status:  Discontinued      Does not apply See admin instructions 03/27/18 2154 03/28/18 1303   03/27/18 2145  ceFEPIme (MAXIPIME) 2 g in sodium chloride 0.9 % 100 mL IVPB  Status:  Discontinued     2 g 200 mL/hr over 30 Minutes Intravenous  Once 03/27/18 2139 03/27/18 2145   03/27/18 2145  vancomycin (VANCOCIN) IVPB 1000 mg/200 mL premix  Status:  Discontinued     1,000 mg 200 mL/hr over 60 Minutes Intravenous  Once 03/27/18 2139 03/27/18 2146   03/27/18 1715  metroNIDAZOLE (FLAGYL) IVPB 500 mg     500 mg 100 mL/hr over 60 Minutes Intravenous  Once 03/27/18 1705 03/27/18 1856   03/27/18 1700  ceFEPIme (MAXIPIME) 2 g in sodium chloride 0.9 % 100 mL IVPB     2 g 200 mL/hr over 30 Minutes Intravenous  Once 03/27/18 1654 03/27/18 1804   03/27/18 1700  vancomycin (VANCOCIN) 2,000 mg in sodium chloride 0.9 % 500 mL IVPB     2,000 mg 250 mL/hr over 120 Minutes Intravenous  Once 03/27/18 1654 03/27/18 2008          Objective:   Vitals:   03/30/18 2149 03/30/18 2340 03/31/18 0336 03/31/18 0804  BP:  99/66 128/83   Pulse: 71     Resp: 12 13 13    Temp:   97.9 F (36.6 C) 98.6 F (37 C)  TempSrc:   Oral   SpO2: 98%     Weight:      Height:        Wt Readings from Last 3 Encounters:  03/28/18 100.2 kg  05/16/15 90.7 kg  10/13/14 86.2 kg     Intake/Output Summary (Last 24 hours) at 03/31/2018 1147 Last data filed at 03/31/2018 0937 Gross per 24 hour  Intake 1175.59 ml  Output 1300 ml  Net -124.41 ml     Physical Exam  Awake Alert, Oriented X 3, No new F.N deficits, Normal affect [place].AT,PERRAL Supple Neck,No JVD, No cervical lymphadenopathy appriciated.  Symmetrical Chest wall movement, Good air movement bilaterally, CTAB RRR,No Gallops, Rubs or new Murmurs, No Parasternal Heave +ve B.Sounds, Abd Soft, No tenderness, No organomegaly appriciated, No rebound - guarding or rigidity. Foley in place No Cyanosis, Clubbing or edema, No new Rash or  bruise     Data Review:    CBC Recent Labs  Lab 03/27/18 1654 03/27/18 2217 03/28/18 0108 03/29/18 0549 03/30/18 0432 03/31/18 0319  WBC 15.3* 13.7* 14.0* 12.8* 10.9* 10.0  HGB 11.7* 10.4* 11.1* 8.9* 8.8* 8.6*  HCT 37.4 31.8* 33.7* 27.1* 26.7* 26.7*  PLT 223 206 220 215 186 248  MCV 99.5 96.4 98.0 96.1 95.0 97.4  MCH 31.1 31.5 32.3 31.6 31.3 31.4  MCHC 31.3 32.7 32.9 32.8 33.0 32.2  RDW 12.9 12.9 13.1 13.1 12.7 13.0  LYMPHSABS 1.2  --  1.6 1.7 1.9 2.4  MONOABS 1.2*  --  1.2* 1.3* 1.1* 1.1*  EOSABS 0.2  --  0.0 0.1 0.2 0.4  BASOSABS 0.0  --  0.0 0.0 0.0 0.1    Chemistries  Recent Labs  Lab 03/28/18 0108 03/28/18 0733 03/29/18 0549 03/29/18 1612 03/30/18 0432 03/30/18 1415 03/31/18 0319  NA 135 135 137 136 132* 134* 136  K 4.3 3.8 3.6 3.9 3.5 3.8 3.7  CL 104  108 104 105 103 102 101  CO2 17* 15* 18* 17* 19* 19* 21*  GLUCOSE 132* 112* 105* 114* 94 125* 94  BUN 61* 60* 63* 64* 64* 65* 64*  CREATININE 8.74* 8.48* 9.36* 9.15* 9.42* 9.37* 9.36*  CALCIUM 7.6* 7.2* 7.9* 8.2* 8.0* 8.4* 8.3*  MG  --   --  1.9  --   --   --   --   AST 599* 449* 303*  --  228*  --  147*  ALT 258* 210* 181*  --  148*  --  118*  ALKPHOS 38 33* 35*  --  32*  --  36*  BILITOT 0.5 0.5 0.7  --  0.8  --  0.6   ------------------------------------------------------------------------------------------------------------------ No results for input(s): CHOL, HDL, LDLCALC, TRIG, CHOLHDL, LDLDIRECT in the last 72 hours.  Lab Results  Component Value Date   HGBA1C 6.3 (H) 08/08/2014   ------------------------------------------------------------------------------------------------------------------ No results for input(s): TSH, T4TOTAL, T3FREE, THYROIDAB in the last 72 hours.  Invalid input(s): FREET3 ------------------------------------------------------------------------------------------------------------------ No results for input(s): VITAMINB12, FOLATE, FERRITIN, TIBC, IRON, RETICCTPCT in the  last 72 hours.  Coagulation profile Recent Labs  Lab 03/27/18 2217 03/28/18 0733  INR 1.16 1.21    No results for input(s): DDIMER in the last 72 hours.  Cardiac Enzymes Recent Labs  Lab 03/27/18 2217 03/28/18 0733 03/28/18 1140  TROPONINI 0.03* 0.03* 0.03*   ------------------------------------------------------------------------------------------------------------------    Component Value Date/Time   BNP 173.1 (H) 03/29/2018 0549    Micro Results Recent Results (from the past 240 hour(s))  Blood culture (routine x 2)     Status: None (Preliminary result)   Collection Time: 03/27/18  5:05 PM  Result Value Ref Range Status   Specimen Description SITE NOT SPECIFIED  Final   Special Requests   Final    BOTTLES DRAWN AEROBIC AND ANAEROBIC Blood Culture adequate volume   Culture   Final    NO GROWTH 4 DAYS Performed at [facility], [address] [place], [place] [address]    Report Status PENDING  Incomplete  Urine culture     Status: Abnormal   Collection Time: 03/27/18  5:12 PM  Result Value Ref Range Status   Specimen Description URINE, RANDOM  Final   Special Requests   Final    NONE Performed at [facility], [address], [place], [place] [address]    Culture (A)  Final    >=100,000 COLONIES/mL ESCHERICHIA COLI Confirmed Extended Spectrum Beta-Lactamase Producer (ESBL).  In bloodstream infections from ESBL organisms, carbapenems are preferred over piperacillin/tazobactam. They are shown to have a lower risk of mortality.    Report Status 03/29/2018 FINAL  Final   Organism ID, Bacteria ESCHERICHIA COLI (A)  Final      Susceptibility   Escherichia coli - MIC*    AMPICILLIN >=32 RESISTANT Resistant     CEFAZOLIN >=64 RESISTANT Resistant     CEFTRIAXONE >=64 RESISTANT Resistant     CIPROFLOXACIN >=4 RESISTANT Resistant     GENTAMICIN >=16 RESISTANT Resistant     IMIPENEM <=0.25 SENSITIVE Sensitive     NITROFURANTOIN <=16 SENSITIVE Sensitive      TRIMETH/SULFA <=20 SENSITIVE Sensitive     AMPICILLIN/SULBACTAM >=32 RESISTANT Resistant     PIP/TAZO <=4 SENSITIVE Sensitive     Extended ESBL POSITIVE Resistant     * >=100,000 COLONIES/mL ESCHERICHIA COLI  Blood culture (routine x 2)     Status: None (Preliminary result)   Collection Time: 03/27/18  5:15 PM  Result Value Ref Range Status   Specimen Description SITE NOT SPECIFIED  Final   Special Requests   Final    BOTTLES DRAWN AEROBIC AND ANAEROBIC Blood Culture results may not be optimal due to an inadequate volume of blood received in culture bottles   Culture   Final    NO GROWTH 4 DAYS Performed at [facility], [address], [place], [place] [address]    Report Status PENDING  Incomplete  MRSA PCR Screening     Status: None   Collection Time: 03/27/18  9:53 PM  Result Value Ref Range Status   MRSA by PCR NEGATIVE NEGATIVE Final    Comment:        The GeneXpert MRSA Assay (FDA approved for NASAL specimens only), is one component of a comprehensive MRSA colonization surveillance program. It is not intended to diagnose MRSA infection nor to guide or monitor treatment for MRSA infections. Performed at [facility], [address], [place], [place] [address]   Culture, sputum-assessment     Status: None   Collection Time: 03/28/18  7:43 PM  Result Value Ref Range Status   Specimen Description EXPECTORATED SPUTUM  Final   Special Requests NONE  Final   Sputum evaluation   Final    THIS SPECIMEN IS ACCEPTABLE FOR SPUTUM CULTURE Performed at [facility], [address], [place], [place] [address]    Report Status 03/29/2018 FINAL  Final  Culture, respiratory     Status: None (Preliminary result)   Collection Time: 03/28/18  7:43 PM  Result Value Ref Range Status   Specimen Description EXPECTORATED SPUTUM  Final   Special Requests NONE Reflexed from [id number]  Final   Gram Stain   Final    ABUNDANT WBC PRESENT,BOTH PMN AND MONONUCLEAR MODERATE  GRAM VARIABLE ROD ABUNDANT YEAST MODERATE SQUAMOUS EPITHELIAL CELLS PRESENT    Culture   Final    FEW YEAST CULTURE REINCUBATED FOR BETTER GROWTH Performed at [facility], [address], [place], [place] [address]    Report Status PENDING  Incomplete    Radiology Reports Ct Head Wo Contrast  Result Date: 03/28/2018 CLINICAL DATA:  Altered mental status EXAM: CT HEAD WITHOUT CONTRAST TECHNIQUE: Contiguous axial images were obtained from the base of the skull through the vertex without intravenous contrast. COMPARISON:  Head CT 10/04/2015 FINDINGS: Brain: There is no mass, hemorrhage or extra-axial collection. The size and configuration of the ventricles and extra-axial CSF spaces are normal. The brain parenchyma is normal, without evidence of acute or chronic infarction. Vascular: No abnormal hyperdensity of the major intracranial arteries or dural venous sinuses. No intracranial atherosclerosis. Skull: The visualized skull base, calvarium and extracranial soft tissues are normal. Sinuses/Orbits: No fluid levels or advanced mucosal thickening of the visualized paranasal sinuses. No mastoid or middle ear effusion. The orbits are normal. IMPRESSION: Normal head CT. Electronically Signed   By: Deatra Robinson Leach.D.   On: 03/28/2018 06:01   Nm Pulmonary Perfusion  Result Date: 03/28/2018 CLINICAL DATA:  Acute respiratory failure with hypoxia. Droplet precautions for influenza. EXAM: NUCLEAR MEDICINE PERFUSION LUNG SCAN TECHNIQUE: Perfusion images were obtained in multiple projections after intravenous injection of radiopharmaceutical. RADIOPHARMACEUTICALS:  4 millicurie mCi Tc-80m MAA IV COMPARISON:  03/27/2018 FINDINGS: The perfusion lung scan appears normal. No perfusion defect is identified to suggest pulmonary embolus. IMPRESSION: 1. Normal-appearing perfusion lung scan. No perfusion defect to suggest pulmonary embolus. 2. Because the perfusion images  were normal, and because the patient is  on droplet precautions, I felt that ventilation images would be problematic to obtain and not helpful for diagnosis, and accordingly today's exam is performed as a perfusion lung scan only rather than a ventilation-perfusion scan. Electronically Signed   By: Gaylyn Rong Leach.D.   On: 03/28/2018 13:09   US Renal  Result Date: 03/28/2018 CLINICAL DATA:  52 y/o  F; acute renal failure. EXAM: RENAL / URINARY TRACT ULTRASOUND COMPLETE COMPARISON:  03/27/2018 CT abdomen and pelvis. FINDINGS: Right Kidney: Renal measurements: 11.4 x 6.3 x 5.9 cm = volume: 222 mL . Echogenicity within normal limits. No mass or hydronephrosis visualized. Prominent interpolar kidney Bertin column. Left Kidney: Renal measurements: 12.1 x 6.1 x 4.6 cm = volume: 293 mL. Echogenicity within normal limits. No mass or hydronephrosis visualized. Bladder: Appears normal for degree of bladder distention. Foley catheter in situ. IMPRESSION: Normal renal ultrasound. Electronically Signed   By: Mitzi Hansen Leach.D.   On: 03/28/2018 16:00   Dg Chest Port 1 View  Result Date: 03/29/2018 CLINICAL DATA:  New onset persistent cough this morning. Acute kidney injury. Acute respiratory failure with hypoxia. EXAM: PORTABLE CHEST 1 VIEW COMPARISON:  Radiographs 11/03/2014 and 03/27/2018. Chest CT 10/04/2015. Abdominal CT 03/27/2018. FINDINGS: 0749 hours. The heart size and mediastinal contours are normal. There are low lung volumes with mild interstitial prominence in both lung bases, similar to recent prior study. The pulmonary vascularity is normal. There is no confluent airspace opacity, pleural effusion or pneumothorax. The bones appear unchanged. IMPRESSION: Interstitial prominence at both lung bases, similar to recent prior study and likely due to a combination of atelectasis and peribronchial inflammation as correlated with recent abdominal CT. No consolidation or significant pleural effusion. Electronically Signed   By: Carey Bullocks Leach.D.   On: 03/29/2018 08:18   Dg Chest Portable 1 View  Result Date: 03/27/2018 CLINICAL DATA:  Severe cough hypoxia EXAM: PORTABLE CHEST 1 VIEW COMPARISON:  CT 10/04/2015, radiograph 11/03/2014 FINDINGS: No focal consolidation or pleural effusion. Stable cardiomediastinal silhouette. No pneumothorax. IMPRESSION: No active disease. Electronically Signed   By: Jasmine Pang Leach.D.   On: 03/27/2018 17:54   Ct Renal Stone Study  Result Date: 03/27/2018 CLINICAL DATA:  Initial evaluation for acute bilateral flank pain. EXAM: CT ABDOMEN AND PELVIS WITHOUT CONTRAST TECHNIQUE: Multidetector CT imaging of the abdomen and pelvis was performed following the standard protocol without IV contrast. COMPARISON:  Prior CT from 10/04/2015. FINDINGS: Lower chest: Dependent atelectatic changes noted within the visualized lung bases. Few additional scattered tree-in-bud nodular densities noted, nonspecific, but suggestive of possible endobronchial infection/small airways disease. Hepatobiliary: Liver demonstrates a normal unenhanced appearance. Gallbladder within normal limits. No biliary dilatation. Pancreas: Mild diffuse fatty infiltration the pancreas noted. Pancreas otherwise unremarkable without acute inflammatory changes. Spleen: Spleen within normal limits. Adrenals/Urinary Tract: Adrenal glands are normal. Kidneys equal in size. No nephrolithiasis or hydronephrosis. No radiopaque calculi seen along the course of either renal collecting system. No hydroureter. Subtle hazy perinephric fat stranding seen about the kidneys bilaterally, right slightly greater than left, raising the possibility for possible upper urinary tract infection. No perinephric collections seen on this noncontrast examination. No layering stones within the bladder lumen. Mild circumferential wall thickening about the bladder. Stomach/Bowel: Stomach within normal limits. No evidence for bowel obstruction. Normal appendix. No acute inflammatory  changes seen about the bowels. Vascular/Lymphatic: Mild aorto bi-iliac atherosclerotic disease. No aneurysm. No adenopathy. Reproductive: Uterus and ovaries within normal limits. Other: No  free air or fluid. Small fat containing paraumbilical hernia noted. Musculoskeletal: No acute osseous abnormality. No worrisome lytic or blastic osseous lesions. Mild multilevel facet arthropathy noted within the lumbar spine. IMPRESSION: 1. No CT evidence for nephrolithiasis or obstructive uropathy. 2. Mild hazy perinephric fat stranding about the kidneys bilaterally, right greater than left. Findings are nonspecific, but could reflect sequelae of acute upper urinary tract infection/pyelonephritis. Correlation with urinalysis recommended. Mild circumferential bladder wall thickening may be related to incomplete distension and/or concomitant cystitis. 3. Patchy multifocal nodular tree-in-bud opacities within the visualized lung bases, suspicious for acute endobronchial infection/small airways disease. 4. No other acute abnormality within the abdomen and pelvis. Electronically Signed   By: Rise Mu Leach.D.   On: 03/27/2018 20:09    Time Spent in minutes  30   Susa Raring Leach.D on 03/31/2018 at 11:47 AM  To page go to [email or web address] - password [facility]

## 2018-04-01 LAB — CULTURE, RESPIRATORY

## 2018-04-01 LAB — COMPREHENSIVE METABOLIC PANEL
ALBUMIN: 2.1 g/dL — AB (ref 3.5–5.0)
ALT: 91 U/L — ABNORMAL HIGH (ref 0–44)
AST: 101 U/L — ABNORMAL HIGH (ref 15–41)
Alkaline Phosphatase: 36 U/L — ABNORMAL LOW (ref 38–126)
Anion gap: 17 — ABNORMAL HIGH (ref 5–15)
BILIRUBIN TOTAL: 0.6 mg/dL (ref 0.3–1.2)
BUN: 63 mg/dL — ABNORMAL HIGH (ref 6–20)
CALCIUM: 8.4 mg/dL — AB (ref 8.9–10.3)
CO2: 19 mmol/L — ABNORMAL LOW (ref 22–32)
Chloride: 101 mmol/L (ref 98–111)
Creatinine, Ser: 8.92 mg/dL — ABNORMAL HIGH (ref 0.44–1.00)
GFR calc Af Amer: 5 mL/min — ABNORMAL LOW (ref >60)
GFR, EST NON AFRICAN AMERICAN: 5 mL/min — AB (ref >60)
Glucose, Bld: 92 mg/dL (ref 70–99)
Potassium: 3.7 mmol/L (ref 3.5–5.1)
Sodium: 137 mmol/L (ref 135–145)
Total Protein: 5.4 g/dL — ABNORMAL LOW (ref 6.5–8.1)

## 2018-04-01 LAB — CULTURE, BLOOD (ROUTINE X 2)
Culture: NO GROWTH
Culture: NO GROWTH
Special Requests: ADEQUATE

## 2018-04-01 LAB — CULTURE, RESPIRATORY W GRAM STAIN

## 2018-04-01 MED ORDER — LACTATED RINGERS IV SOLN
INTRAVENOUS | Status: DC
  Administered 2018-04-01: 10:00:00 via INTRAVENOUS

## 2018-04-01 NOTE — Progress Notes (Signed)
Admit: 03/27/2018 LOS: 5  44F witn oliguric AKI from rhabdomyolysis after being down for 5d  Subjective:  . No interval events, inc UOP . SCr improved slightly, lytes ok . Remains on LR  11/30 0701 - 12/01 0700 In: 2830 [P.O.:720; I.V.:1510; IV Piggyback:600] Out: 3175 [Urine:3175]  Filed Weights   03/27/18 2145 03/28/18 0437 04/01/18 0512  Weight: 100.2 kg 100.2 kg 113.7 kg    Scheduled Meds: . acetaminophen  650 mg Oral Once  . heparin injection (subcutaneous)  5,000 Units Subcutaneous Q8H  . mouth rinse  15 mL Mouth Rinse BID  . sodium bicarbonate  650 mg Oral BID   Continuous Infusions: . doxycycline (VIBRAMYCIN) IV 100 mg (04/01/18 0108)  . lactated ringers    . meropenem (MERREM) IV 1 g (03/31/18 1249)   PRN Meds:.technetium TC 14M diethylenetriame-pentaacetic acid, traMADol  Current Labs: reviewed    Physical Exam:  Blood pressure 104/69, pulse 66, temperature 98.2 F (36.8 C), temperature source Oral, resp. rate 13, height 5\' 5"  (1.651 m), weight 113.7 kg, SpO2 96 %. GEN: Chronically ill-appearing, no acute distress, in chair ENT: NCAT EYES: EOMI CV: RRR, normal S1-S2 PULM: Scattered expiratory wheezing, normal work of breathing, no crackles ABD: Soft, nontender SKIN: No rashes or lesions EXT: Trace lower extremity edema  A 1. AKI from rhabdomyolysis and likely resultant ATN; UOP improving 2. Rhabdomyolysis after being in bed x5d 3. ESBL E. Coli UTI, ? Pyelonephritis, on meropenem 4. Metabolic Acidosis, inc AG, from #1 5. Anemia, stable, normocytic,   P . Should recover in time, some suggestio of that today . Cont current management; stop LR as eating well now and CK is improved . Will follow closely . OOB, use IS, walking in halls encouraged . Medication Issues; o Preferred narcotic agents for pain control are hydromorphone, fentanyl, and methadone. Morphine should not be used.  o Baclofen should be avoided o Avoid oral sodium phosphate and  magnesium citrate based laxatives / bowel preps    Sabra Heckyan Ily Denno MD 04/01/2018, 9:43 AM  Recent Labs  Lab 03/29/18 0549  03/30/18 1415 03/31/18 0319 04/01/18 0444  NA 137   < > 134* 136 137  K 3.6   < > 3.8 3.7 3.7  CL 104   < > 102 101 101  CO2 18*   < > 19* 21* 19*  GLUCOSE 105*   < > 125* 94 92  BUN 63*   < > 65* 64* 63*  CREATININE 9.36*   < > 9.37* 9.36* 8.92*  CALCIUM 7.9*   < > 8.4* 8.3* 8.4*  PHOS 6.3*  --   --   --   --    < > = values in this interval not displayed.   Recent Labs  Lab 03/29/18 0549 03/30/18 0432 03/31/18 0319  WBC 12.8* 10.9* 10.0  NEUTROABS 9.6* 7.5 6.0  HGB 8.9* 8.8* 8.6*  HCT 27.1* 26.7* 26.7*  MCV 96.1 95.0 97.4  PLT 215 186 248

## 2018-04-01 NOTE — Progress Notes (Signed)
Pharmacy Antibiotic Note  Sammuel HinesDeborah Hoffmeister is a 52 y.o. female admitted on 03/27/2018 , the ER work-up was suggestive of sepsis due to pyelonephritis along with possible narcotic and benzo overdose.  Pharmacy has been consulted for Meropenem dosing for  E.coli  ESBL pyelonephritis.  D3 merrem now. Dr. Thedore MinsSingh has dced doxy. Consider shooting for 7d. Still in ARF due to rhabdo.    Plan: Meropenem 1 gm IV q24h   Height: 5\' 5"  (165.1 cm) Weight: 250 lb 10.6 oz (113.7 kg) IBW/kg (Calculated) : 57  Temp (24hrs), Avg:98.4 F (36.9 C), Min:98.2 F (36.8 C), Max:98.7 F (37.1 C)  Recent Labs  Lab 03/27/18 1731 03/27/18 1946 03/27/18 2011 03/27/18 2217 03/28/18 0108  03/29/18 0549 03/29/18 1612 03/30/18 0432 03/30/18 1415 03/31/18 0319 04/01/18 0444  WBC  --   --   --  13.7* 14.0*  --  12.8*  --  10.9*  --  10.0  --   CREATININE  --   --   --  8.74* 8.74*   < > 9.36* 9.15* 9.42* 9.37* 9.36* 8.92*  LATICACIDVEN 3.21* 1.6 1.72 1.4 1.3  --   --   --   --   --   --   --   VANCORANDOM  --   --   --   --   --   --  24  --   --   --   --   --    < > = values in this interval not displayed.    Estimated Creatinine Clearance: 9.4 mL/min (A) (by C-G formula based on SCr of 8.92 mg/dL (H)).    No Known Allergies  Antimicrobials this admission: 11/26 vanc>>11/27  (VR = 24 on 11/28 11/26 cefepime>>11/27 11/26 flagyl>>11/27 11/27: Doxy>>12/1 11/28: Meropenem>>  Dose adjustments this admission:   Microbiology results: 1/26 urine>> E.coli  ESBL  11/26 blood>>ngtd  11/26 MRSA PCR>>negative 11/27 sputum:  mod gram variable rod, abundant yeast  Ulyses SouthwardMinh Pham, PharmD, [place], AAHIVP, CPP Infectious Disease Pharmacist 04/01/2018 3:16 PM

## 2018-04-01 NOTE — Progress Notes (Signed)
PROGRESS NOTE                                                                                                                                                                                                             Patient Demographics:    Jeanne Leach, is a 52 y.o. female, DOB - 1965-10-20, ZOX:[id number]  Admit date - 03/27/2018   Admitting Physician Eduard Clos, MD  Outpatient Primary MD for the patient is Jearld Lesch, MD  LOS - 5  Chief Complaint  Patient presents with  . Respiratory Distress       Brief Narrative - Jeanne Leach is a 52 y.o. female with history of hypertension, chronic pain, anxiety, chronic kidney disease, chronic anemia was found to be weak and drowsy by patient's family.  As per the ER physician and the patient patient's family had come to check on her today to take her for dinner with her other family members when patient was found to be drowsy weak and unable to ambulate because of the weakness.  EMS was called patient was found to be hypoxic weak hypotensive was given fluid bolus and brought to the ER, the ER work-up was suggestive of sepsis due to pyelonephritis along with possible narcotic and benzo overdose.   Subjective:   Patient in bed, appears comfortable, denies any headache, no fever, no chest pain or pressure, no shortness of breath , no abdominal pain. No focal weakness.   Assessment  & Plan :      1.  Toxic & Metabolic Encephalopathy due to combination of ARF, sepsis caused by pyelonephritis along with prescription drug abuse.  Treated conservatively, offending medications were held, she has been placed on antibiotics, being hydrated with IV fluids, mentation has improved.  2.  ARF.  Due to combination of rhabdomyolysis along with severe dehydration and pyelonephritis.  No hydronephrosis on CT scan, she has had a Foley catheter placed in the ER which I will continue to monitor intake and output strictly, she had an  unremarkable renal ultrasound, already on board Case discussed with Dr. Marisue Humble on 04/01/2018, renal function now finally improving with hydration which will be continued, good urine output creatinine trending down.  3.  Possible pneumonia on chest x-ray.  No cough or shortness of breath, stop doxycycline continue meropenem for UTI and pyelonephritis.  4.  Acute hypoxic and hypercapnic respiratory failure.  Likely due to poor mentation along with drug overdose and possible mild pneumonia.  Initially required BiPAP  now much improved, continue present line of care and monitor.  Currently on room air.  5.  Severe metabolic acidosis.  Due to combination of sepsis and renal failure, will place on low-dose bicarb drip and monitor.  6.  Anemia of chronic disease.  Monitor.  7.  Elevated LFTs likely due to shock liver from hypotension and dehydration.  Hydrate and monitor trend improving.  INR is 1.1 suggesting good synthetic function.  8.  Elevated d-dimer.  Due to infection.  VQ scan ordered upon admission has been done and results are pending will monitor clinical suspicion for PE is extremely low.  9.  Prescription drug abuse.  Counseled.  Urine drug screen ordered as well.  Tylenol and salicylate levels are stable.  10.  ESBL pyelonephritis.  Currently on meropenem which will be started on 03/29/2018 morning.  Clinically stable.    Family Communication  :  None  Code Status :  Full  Disposition Plan  : Medical  bed  Consults  :  None  Procedures  :    CT - 1. No CT evidence for nephrolithiasis or obstructive uropathy. 2. Mild hazy perinephric fat stranding about the kidneys bilaterally, right greater than left. Findings are nonspecific, but could reflect sequelae of acute upper urinary tract infection/pyelonephritis. Correlation with urinalysis recommended. Mild circumferential bladder wall thickening may be related to incomplete distension and/or concomitant cystitis. 3. Patchy multifocal  nodular tree-in-bud opacities within the visualized lung bases, suspicious for acute endobronchial infection/small airways disease. 4. No other acute abnormality within the abdomen and pelvis.  DVT Prophylaxis  :   Heparin    Lab Results  Component Value Date   PLT 248 03/31/2018    Diet :  Diet Order            Diet Heart Room service appropriate? Yes; Fluid consistency: Thin  Diet effective now               Inpatient Medications Scheduled Meds: . acetaminophen  650 mg Oral Once  . heparin injection (subcutaneous)  5,000 Units Subcutaneous Q8H  . mouth rinse  15 mL Mouth Rinse BID  . sodium bicarbonate  650 mg Oral BID   Continuous Infusions: . doxycycline (VIBRAMYCIN) IV 100 mg (04/01/18 0108)  . meropenem (MERREM) IV 1 g (03/31/18 1249)   PRN Meds:.technetium TC 58M diethylenetriame-pentaacetic acid, traMADol  Antibiotics  :   Anti-infectives (From admission, onward)   Start     Dose/Rate Route Frequency Ordered Stop   03/29/18 1200  meropenem (MERREM) 1 g in sodium chloride 0.9 % 100 mL IVPB     1 g 200 mL/hr over 30 Minutes Intravenous Every 24 hours 03/29/18 1101     03/28/18 1700  ceFEPIme (MAXIPIME) 1 g in sodium chloride 0.9 % 100 mL IVPB  Status:  Discontinued     1 g 200 mL/hr over 30 Minutes Intravenous Every 24 hours 03/27/18 2154 03/28/18 1303   03/28/18 1400  cefTRIAXone (ROCEPHIN) 1 g in sodium chloride 0.9 % 100 mL IVPB  Status:  Discontinued     1 g 200 mL/hr over 30 Minutes Intravenous Every 24 hours 03/28/18 1303 03/29/18 1036   03/28/18 1400  doxycycline (VIBRAMYCIN) 100 mg in sodium chloride 0.9 % 250 mL IVPB     100 mg 125 mL/hr over 120 Minutes Intravenous Every 12 hours 03/28/18 1307     03/28/18 0200  metroNIDAZOLE (FLAGYL) IVPB 500 mg  Status:  Discontinued     500 mg  100 mL/hr over 60 Minutes Intravenous Every 8 hours 03/27/18 2139 03/28/18 1303   03/27/18 2154  vancomycin variable dose per unstable renal function (pharmacist dosing)   Status:  Discontinued      Does not apply See admin instructions 03/27/18 2154 03/28/18 1303   03/27/18 2145  ceFEPIme (MAXIPIME) 2 g in sodium chloride 0.9 % 100 mL IVPB  Status:  Discontinued     2 g 200 mL/hr over 30 Minutes Intravenous  Once 03/27/18 2139 03/27/18 2145   03/27/18 2145  vancomycin (VANCOCIN) IVPB 1000 mg/200 mL premix  Status:  Discontinued     1,000 mg 200 mL/hr over 60 Minutes Intravenous  Once 03/27/18 2139 03/27/18 2146   03/27/18 1715  metroNIDAZOLE (FLAGYL) IVPB 500 mg     500 mg 100 mL/hr over 60 Minutes Intravenous  Once 03/27/18 1705 03/27/18 1856   03/27/18 1700  ceFEPIme (MAXIPIME) 2 g in sodium chloride 0.9 % 100 mL IVPB     2 g 200 mL/hr over 30 Minutes Intravenous  Once 03/27/18 1654 03/27/18 1804   03/27/18 1700  vancomycin (VANCOCIN) 2,000 mg in sodium chloride 0.9 % 500 mL IVPB     2,000 mg 250 mL/hr over 120 Minutes Intravenous  Once 03/27/18 1654 03/27/18 2008          Objective:   Vitals:   03/31/18 1615 03/31/18 2232 04/01/18 0109 04/01/18 0512  BP:  (!) 150/103 (!) 128/95 104/69  Pulse:  71 66   Resp:      Temp: 98.7 F (37.1 C) 98.3 F (36.8 C)  98.2 F (36.8 C)  TempSrc: Oral Oral  Oral  SpO2:  97% 96%   Weight:    113.7 kg  Height:        Wt Readings from Last 3 Encounters:  04/01/18 113.7 kg  05/16/15 90.7 kg  10/13/14 86.2 kg     Intake/Output Summary (Last 24 hours) at 04/01/2018 1006 Last data filed at 04/01/2018 0543 Gross per 24 hour  Intake 2580 ml  Output 1875 ml  Net 705 ml     Physical Exam  Awake Alert, Oriented X 3, No new F.N deficits, Normal affect [place].AT,PERRAL Supple Neck,No JVD, No cervical lymphadenopathy appriciated.  Symmetrical Chest wall movement, Good air movement bilaterally, CTAB RRR,No Gallops, Rubs or new Murmurs, No Parasternal Heave +ve B.Sounds, Abd Soft, No tenderness, No organomegaly appriciated, No rebound - guarding or rigidity. Foley in place No Cyanosis, Clubbing or edema, No  new Rash or bruise     Data Review:    CBC Recent Labs  Lab 03/27/18 1654 03/27/18 2217 03/28/18 0108 03/29/18 0549 03/30/18 0432 03/31/18 0319  WBC 15.3* 13.7* 14.0* 12.8* 10.9* 10.0  HGB 11.7* 10.4* 11.1* 8.9* 8.8* 8.6*  HCT 37.4 31.8* 33.7* 27.1* 26.7* 26.7*  PLT 223 206 220 215 186 248  MCV 99.5 96.4 98.0 96.1 95.0 97.4  MCH 31.1 31.5 32.3 31.6 31.3 31.4  MCHC 31.3 32.7 32.9 32.8 33.0 32.2  RDW 12.9 12.9 13.1 13.1 12.7 13.0  LYMPHSABS 1.2  --  1.6 1.7 1.9 2.4  MONOABS 1.2*  --  1.2* 1.3* 1.1* 1.1*  EOSABS 0.2  --  0.0 0.1 0.2 0.4  BASOSABS 0.0  --  0.0 0.0 0.0 0.1    Chemistries  Recent Labs  Lab 03/28/18 0733 03/29/18 0549 03/29/18 1612 03/30/18 0432 03/30/18 1415 03/31/18 0319 04/01/18 0444  NA 135 137 136 132* 134* 136 137  K 3.8 3.6 3.9 3.5 3.8 3.7  3.7  CL 108 104 105 103 102 101 101  CO2 15* 18* 17* 19* 19* 21* 19*  GLUCOSE 112* 105* 114* 94 125* 94 92  BUN 60* 63* 64* 64* 65* 64* 63*  CREATININE 8.48* 9.36* 9.15* 9.42* 9.37* 9.36* 8.92*  CALCIUM 7.2* 7.9* 8.2* 8.0* 8.4* 8.3* 8.4*  MG  --  1.9  --   --   --   --   --   AST 449* 303*  --  228*  --  147* 101*  ALT 210* 181*  --  148*  --  118* 91*  ALKPHOS 33* 35*  --  32*  --  36* 36*  BILITOT 0.5 0.7  --  0.8  --  0.6 0.6   ------------------------------------------------------------------------------------------------------------------ No results for input(s): CHOL, HDL, LDLCALC, TRIG, CHOLHDL, LDLDIRECT in the last 72 hours.  Lab Results  Component Value Date   HGBA1C 6.3 (H) 08/08/2014   ------------------------------------------------------------------------------------------------------------------ No results for input(s): TSH, T4TOTAL, T3FREE, THYROIDAB in the last 72 hours.  Invalid input(s): FREET3 ------------------------------------------------------------------------------------------------------------------ No results for input(s): VITAMINB12, FOLATE, FERRITIN, TIBC, IRON,  RETICCTPCT in the last 72 hours.  Coagulation profile Recent Labs  Lab 03/27/18 2217 03/28/18 0733  INR 1.16 1.21    No results for input(s): DDIMER in the last 72 hours.  Cardiac Enzymes Recent Labs  Lab 03/27/18 2217 03/28/18 0733 03/28/18 1140  TROPONINI 0.03* 0.03* 0.03*   ------------------------------------------------------------------------------------------------------------------    Component Value Date/Time   BNP 173.1 (H) 03/29/2018 0549    Micro Results Recent Results (from the past 240 hour(s))  Blood culture (routine x 2)     Status: None   Collection Time: 03/27/18  5:05 PM  Result Value Ref Range Status   Specimen Description SITE NOT SPECIFIED  Final   Special Requests   Final    BOTTLES DRAWN AEROBIC AND ANAEROBIC Blood Culture adequate volume   Culture   Final    NO GROWTH 5 DAYS Performed at [facility], [address] [place], [place] [address]    Report Status 04/01/2018 FINAL  Final  Urine culture     Status: Abnormal   Collection Time: 03/27/18  5:12 PM  Result Value Ref Range Status   Specimen Description URINE, RANDOM  Final   Special Requests   Final    NONE Performed at [facility], [address] [place], [place] [address]    Culture (A)  Final    >=100,000 COLONIES/mL ESCHERICHIA COLI Confirmed Extended Spectrum Beta-Lactamase Producer (ESBL).  In bloodstream infections from ESBL organisms, carbapenems are preferred over piperacillin/tazobactam. They are shown to have a lower risk of mortality.    Report Status 03/29/2018 FINAL  Final   Organism ID, Bacteria ESCHERICHIA COLI (A)  Final      Susceptibility   Escherichia coli - MIC*    AMPICILLIN >=32 RESISTANT Resistant     CEFAZOLIN >=64 RESISTANT Resistant     CEFTRIAXONE >=64 RESISTANT Resistant     CIPROFLOXACIN >=4 RESISTANT Resistant     GENTAMICIN >=16 RESISTANT Resistant     IMIPENEM <=0.25 SENSITIVE Sensitive     NITROFURANTOIN <=16 SENSITIVE Sensitive      TRIMETH/SULFA <=20 SENSITIVE Sensitive     AMPICILLIN/SULBACTAM >=32 RESISTANT Resistant     PIP/TAZO <=4 SENSITIVE Sensitive     Extended ESBL POSITIVE Resistant     * >=100,000 COLONIES/mL ESCHERICHIA COLI  Blood culture (routine x 2)     Status: None   Collection Time:  03/27/18  5:15 PM  Result Value Ref Range Status   Specimen Description SITE NOT SPECIFIED  Final   Special Requests   Final    BOTTLES DRAWN AEROBIC AND ANAEROBIC Blood Culture results may not be optimal due to an inadequate volume of blood received in culture bottles   Culture   Final    NO GROWTH 5 DAYS Performed at [facility], [address], [place], [place] [address]    Report Status 04/01/2018 FINAL  Final  MRSA PCR Screening     Status: None   Collection Time: 03/27/18  9:53 PM  Result Value Ref Range Status   MRSA by PCR NEGATIVE NEGATIVE Final    Comment:        The GeneXpert MRSA Assay (FDA approved for NASAL specimens only), is one component of a comprehensive MRSA colonization surveillance program. It is not intended to diagnose MRSA infection nor to guide or monitor treatment for MRSA infections. Performed at [facility], [address] [place], [place] [address]   Culture, sputum-assessment     Status: None   Collection Time: 03/28/18  7:43 PM  Result Value Ref Range Status   Specimen Description EXPECTORATED SPUTUM  Final   Special Requests NONE  Final   Sputum evaluation   Final    THIS SPECIMEN IS ACCEPTABLE FOR SPUTUM CULTURE Performed at [facility], [address] [place], [place] [address]    Report Status 03/29/2018 FINAL  Final  Culture, respiratory     Status: None (Preliminary result)   Collection Time: 03/28/18  7:43 PM  Result Value Ref Range Status   Specimen Description EXPECTORATED SPUTUM  Final   Special Requests NONE Reflexed from [id number]  Final   Gram Stain   Final    ABUNDANT WBC PRESENT,BOTH PMN AND MONONUCLEAR MODERATE GRAM VARIABLE  ROD ABUNDANT YEAST MODERATE SQUAMOUS EPITHELIAL CELLS PRESENT    Culture   Final    FEW YEAST CULTURE REINCUBATED FOR BETTER GROWTH Performed at [facility], [address], [place], [place] [address]    Report Status PENDING  Incomplete    Radiology Reports Ct Head Wo Contrast  Result Date: 03/28/2018 CLINICAL DATA:  Altered mental status EXAM: CT HEAD WITHOUT CONTRAST TECHNIQUE: Contiguous axial images were obtained from the base of the skull through the vertex without intravenous contrast. COMPARISON:  Head CT 10/04/2015 FINDINGS: Brain: There is no mass, hemorrhage or extra-axial collection. The size and configuration of the ventricles and extra-axial CSF spaces are normal. The brain parenchyma is normal, without evidence of acute or chronic infarction. Vascular: No abnormal hyperdensity of the major intracranial arteries or dural venous sinuses. No intracranial atherosclerosis. Skull: The visualized skull base, calvarium and extracranial soft tissues are normal. Sinuses/Orbits: No fluid levels or advanced mucosal thickening of the visualized paranasal sinuses. No mastoid or middle ear effusion. The orbits are normal. IMPRESSION: Normal head CT. Electronically Signed   By: Deatra Robinson M.D.   On: 03/28/2018 06:01   Nm Pulmonary Perfusion  Result Date: 03/28/2018 CLINICAL DATA:  Acute respiratory failure with hypoxia. Droplet precautions for influenza. EXAM: NUCLEAR MEDICINE PERFUSION LUNG SCAN TECHNIQUE: Perfusion images were obtained in multiple projections after intravenous injection of radiopharmaceutical. RADIOPHARMACEUTICALS:  4 millicurie mCi Tc-41m MAA IV COMPARISON:  03/27/2018 FINDINGS: The perfusion lung scan appears normal. No perfusion defect is identified to suggest pulmonary embolus. IMPRESSION: 1. Normal-appearing perfusion lung scan. No perfusion defect to suggest pulmonary embolus. 2. Because  the perfusion images were normal, and because the patient is on droplet  precautions, I felt that ventilation images would be problematic to obtain and not helpful for diagnosis, and accordingly today's exam is performed as a perfusion lung scan only rather than a ventilation-perfusion scan. Electronically Signed   By: Gaylyn Rong M.D.   On: 03/28/2018 13:09   US Renal  Result Date: 03/28/2018 CLINICAL DATA:  52 y/o  F; acute renal failure. EXAM: RENAL / URINARY TRACT ULTRASOUND COMPLETE COMPARISON:  03/27/2018 CT abdomen and pelvis. FINDINGS: Right Kidney: Renal measurements: 11.4 x 6.3 x 5.9 cm = volume: 222 mL . Echogenicity within normal limits. No mass or hydronephrosis visualized. Prominent interpolar kidney Bertin column. Left Kidney: Renal measurements: 12.1 x 6.1 x 4.6 cm = volume: 293 mL. Echogenicity within normal limits. No mass or hydronephrosis visualized. Bladder: Appears normal for degree of bladder distention. Foley catheter in situ. IMPRESSION: Normal renal ultrasound. Electronically Signed   By: Mitzi Hansen M.D.   On: 03/28/2018 16:00   Dg Chest Port 1 View  Result Date: 03/29/2018 CLINICAL DATA:  New onset persistent cough this morning. Acute kidney injury. Acute respiratory failure with hypoxia. EXAM: PORTABLE CHEST 1 VIEW COMPARISON:  Radiographs 11/03/2014 and 03/27/2018. Chest CT 10/04/2015. Abdominal CT 03/27/2018. FINDINGS: 0749 hours. The heart size and mediastinal contours are normal. There are low lung volumes with mild interstitial prominence in both lung bases, similar to recent prior study. The pulmonary vascularity is normal. There is no confluent airspace opacity, pleural effusion or pneumothorax. The bones appear unchanged. IMPRESSION: Interstitial prominence at both lung bases, similar to recent prior study and likely due to a combination of atelectasis and peribronchial inflammation as correlated with recent abdominal CT. No consolidation or significant pleural effusion. Electronically Signed   By: Carey Bullocks M.D.    On: 03/29/2018 08:18   Dg Chest Portable 1 View  Result Date: 03/27/2018 CLINICAL DATA:  Severe cough hypoxia EXAM: PORTABLE CHEST 1 VIEW COMPARISON:  CT 10/04/2015, radiograph 11/03/2014 FINDINGS: No focal consolidation or pleural effusion. Stable cardiomediastinal silhouette. No pneumothorax. IMPRESSION: No active disease. Electronically Signed   By: Jasmine Pang M.D.   On: 03/27/2018 17:54   Ct Renal Stone Study  Result Date: 03/27/2018 CLINICAL DATA:  Initial evaluation for acute bilateral flank pain. EXAM: CT ABDOMEN AND PELVIS WITHOUT CONTRAST TECHNIQUE: Multidetector CT imaging of the abdomen and pelvis was performed following the standard protocol without IV contrast. COMPARISON:  Prior CT from 10/04/2015. FINDINGS: Lower chest: Dependent atelectatic changes noted within the visualized lung bases. Few additional scattered tree-in-bud nodular densities noted, nonspecific, but suggestive of possible endobronchial infection/small airways disease. Hepatobiliary: Liver demonstrates a normal unenhanced appearance. Gallbladder within normal limits. No biliary dilatation. Pancreas: Mild diffuse fatty infiltration the pancreas noted. Pancreas otherwise unremarkable without acute inflammatory changes. Spleen: Spleen within normal limits. Adrenals/Urinary Tract: Adrenal glands are normal. Kidneys equal in size. No nephrolithiasis or hydronephrosis. No radiopaque calculi seen along the course of either renal collecting system. No hydroureter. Subtle hazy perinephric fat stranding seen about the kidneys bilaterally, right slightly greater than left, raising the possibility for possible upper urinary tract infection. No perinephric collections seen on this noncontrast examination. No layering stones within the bladder lumen. Mild circumferential wall thickening about the bladder. Stomach/Bowel: Stomach within normal limits. No evidence for bowel obstruction. Normal appendix. No acute inflammatory changes seen  about the bowels. Vascular/Lymphatic: Mild aorto bi-iliac atherosclerotic disease. No aneurysm. No adenopathy. Reproductive: Uterus and ovaries within normal  limits. Other: No free air or fluid. Small fat containing paraumbilical hernia noted. Musculoskeletal: No acute osseous abnormality. No worrisome lytic or blastic osseous lesions. Mild multilevel facet arthropathy noted within the lumbar spine. IMPRESSION: 1. No CT evidence for nephrolithiasis or obstructive uropathy. 2. Mild hazy perinephric fat stranding about the kidneys bilaterally, right greater than left. Findings are nonspecific, but could reflect sequelae of acute upper urinary tract infection/pyelonephritis. Correlation with urinalysis recommended. Mild circumferential bladder wall thickening may be related to incomplete distension and/or concomitant cystitis. 3. Patchy multifocal nodular tree-in-bud opacities within the visualized lung bases, suspicious for acute endobronchial infection/small airways disease. 4. No other acute abnormality within the abdomen and pelvis. Electronically Signed   By: Rise Mu M.D.   On: 03/27/2018 20:09    Time Spent in minutes  30   Susa Raring M.D on 04/01/2018 at 10:06 AM  To page go to [email or web address] - password [email or web address]

## 2018-04-02 LAB — BASIC METABOLIC PANEL
Anion gap: 13 (ref 5–15)
BUN: 60 mg/dL — ABNORMAL HIGH (ref 6–20)
CO2: 22 mmol/L (ref 22–32)
Calcium: 8.8 mg/dL — ABNORMAL LOW (ref 8.9–10.3)
Chloride: 102 mmol/L (ref 98–111)
Creatinine, Ser: 7.61 mg/dL — ABNORMAL HIGH (ref 0.44–1.00)
GFR calc Af Amer: 6 mL/min — ABNORMAL LOW (ref >60)
GFR calc non Af Amer: 6 mL/min — ABNORMAL LOW (ref >60)
Glucose, Bld: 114 mg/dL — ABNORMAL HIGH (ref 70–99)
Potassium: 3.5 mmol/L (ref 3.5–5.1)
Sodium: 137 mmol/L (ref 135–145)

## 2018-04-02 MED ORDER — SODIUM BICARBONATE 650 MG PO TABS
650.0000 mg | ORAL_TABLET | Freq: Every day | ORAL | Status: DC
  Administered 2018-04-03: 650 mg via ORAL
  Filled 2018-04-02: qty 1

## 2018-04-02 MED ORDER — LACTATED RINGERS IV SOLN
INTRAVENOUS | Status: DC
  Administered 2018-04-02 – 2018-04-03 (×2): via INTRAVENOUS

## 2018-04-02 MED ORDER — TRAMADOL HCL 50 MG PO TABS: 100.0000 mg | ORAL_TABLET | Freq: Four times a day (QID) | ORAL | Status: DC

## 2018-04-02 MED ORDER — TRAMADOL HCL 50 MG PO TABS
100.0000 mg | ORAL_TABLET | Freq: Four times a day (QID) | ORAL | Status: DC | PRN
  Administered 2018-04-02 – 2018-04-03 (×2): 100 mg via ORAL
  Filled 2018-04-02 (×2): qty 2

## 2018-04-02 NOTE — Progress Notes (Signed)
  [facility] Progress Note    Assessment/ Plan:   1. AKI fromrhabdomyolysis and likely resultantATN; UOP improving--expect to continue robust autodiuresis and improving creatinine  2. Rhabdomyolysis after being in bed x5d- stop IVFs  3. ESBL E. Coli UTI, ? Pyelonephritis, on meropenem 4. Metabolic Acidosis, inc AG, from #1, on oral bicarb 5. Anemia, stable, normocytic,no intervention required 6. Dispo: expect several days more robust UOP requiring monitoring of Cr/ lytes  Subjective:    Seen in room.  Robust UOP.  Cr coming down.  No complaints.     Objective:   BP (!) 155/89 (BP Location: Right Wrist)   Pulse 76   Temp 98.9 F (37.2 C) (Oral)   Resp 19   Ht 5\' 5"  (1.651 m)   Wt 111.2 kg   SpO2 99%   BMI 40.80 kg/m   Intake/Output Summary (Last 24 hours) at 04/02/2018 1137 Last data filed at 04/02/2018 1024 Gross per 24 hour  Intake 640 ml  Output 4450 ml  Net -3810 ml   Weight change: -2.5 kg  Physical Exam: Gen: NAD CVS: RRR Resp: clear bilaterally no c/w/r Abd: soft nontender NABS Ext: no LE edema  Imaging: No results found.  Labs: BMET Recent Labs  Lab 03/29/18 0549 03/29/18 1612 03/30/18 0432 03/30/18 1415 03/31/18 0319 04/01/18 0444 04/02/18 0814  NA 137 136 132* 134* 136 137 137  K 3.6 3.9 3.5 3.8 3.7 3.7 3.5  CL 104 105 103 102 101 101 102  CO2 18* 17* 19* 19* 21* 19* 22  GLUCOSE 105* 114* 94 125* 94 92 114*  BUN 63* 64* 64* 65* 64* 63* 60*  CREATININE 9.36* 9.15* 9.42* 9.37* 9.36* 8.92* 7.61*  CALCIUM 7.9* 8.2* 8.0* 8.4* 8.3* 8.4* 8.8*  PHOS 6.3*  --   --   --   --   --   --    CBC Recent Labs  Lab 03/28/18 0108 03/29/18 0549 03/30/18 0432 03/31/18 0319  WBC 14.0* 12.8* 10.9* 10.0  NEUTROABS 11.1* 9.6* 7.5 6.0  HGB 11.1* 8.9* 8.8* 8.6*  HCT 33.7* 27.1* 26.7* 26.7*  MCV 98.0 96.1 95.0 97.4  PLT 220 215 186 248    Medications:    . acetaminophen  650 mg Oral Once  . heparin injection (subcutaneous)  5,000  Units Subcutaneous Q8H  . mouth rinse  15 mL Mouth Rinse BID  . [START ON 04/03/2018] sodium bicarbonate  650 mg Oral Daily      Bufford ButtnerElizabeth Lilliona Blakeney, MD 04/02/2018, 11:37 AM

## 2018-04-02 NOTE — Progress Notes (Signed)
Physical Therapy Treatment Patient Details Name: Jeanne Leach MRN: [id number] DOB: [birth year] Today's Date: 04/02/2018    History of Present Illness Pt is a 52 y.o. female with a PMH consisting of HTN, CKD, Bipolar, and chronic pain presents to the ED with weakness and AMS. Pt had spent the previous 5 days laying in bed due to the weakness. Pt found to have toxic & metabolic encephalopathy, and rhabdomyolysis.    PT Comments    Pt in bed with family present upon arrival. Pt willing to participate in therapy, but hesitant about walking due to pain in RLE. Pt participated in transfers and gait training this session. Pt required Min guard for transfers and gait this session. Pt left in chair with all needs met. Pt would benefit from continued PT in order to progress toward stated goals and increase overall strength and balance. Pt remains appropriate for HHPT based on current functional status and assistance at home.   Follow Up Recommendations  Home health PT;Supervision/Assistance - 24 hour     Equipment Recommendations  Rolling walker with 5" wheels    Recommendations for Other Services       Precautions / Restrictions Precautions Precautions: Fall Restrictions Weight Bearing Restrictions: No    Mobility  Bed Mobility Overal bed mobility: Needs Assistance Bed Mobility: Supine to Sit     Supine to sit: Min guard     General bed mobility comments: for safety  Transfers Overall transfer level: Needs assistance Equipment used: Rolling walker (2 wheeled) Transfers: Sit to/from Stand Sit to Stand: Min guard         General transfer comment: VC for hand placement and min guard for safety.  Ambulation/Gait Ambulation/Gait assistance: Min guard Gait Distance (Feet): 75 Feet Assistive device: Rolling walker (2 wheeled) Gait Pattern/deviations: Decreased stride length;Decreased dorsiflexion - right;Decreased dorsiflexion - left;Trunk flexed     General Gait Details:  Pt required Min guard for safety and VC for proximity of RW. VC also needed to maintain upright posture. Pt able to navigate room and hallway with minimal VC.   Stairs             Wheelchair Mobility    Modified Rankin (Stroke Patients Only)       Balance Overall balance assessment: Needs assistance Sitting-balance support: No upper extremity supported;Feet supported Sitting balance-Leahy Scale: Fair Sitting balance - Comments: supervision for safety.     Standing balance-Leahy Scale: Poor Standing balance comment: requires BUE support to maintain balance                            Cognition Arousal/Alertness: Awake/alert Behavior During Therapy: WFL for tasks assessed/performed Overall Cognitive Status: Within Functional Limits for tasks assessed Area of Impairment: Following commands;Problem solving                       Following Commands: Follows one step commands consistently;Follows one step commands with increased time     Problem Solving: Slow processing;Difficulty sequencing        Exercises      General Comments        Pertinent Vitals/Pain Faces Pain Scale: Hurts even more Pain Location: RLE and back Pain Descriptors / Indicators: Constant;Aching;Burning Pain Intervention(s): Limited activity within patient's tolerance;Monitored during session;Repositioned    Home Living                      Prior Function  PT Goals (current goals can now be found in the care plan section) Acute Rehab PT Goals Patient Stated Goal: to return home PT Goal Formulation: With patient Time For Goal Achievement: 04/13/18 Potential to Achieve Goals: Good Progress towards PT goals: Progressing toward goals    Frequency    Min 3X/week      PT Plan Current plan remains appropriate    Co-evaluation              AM-PAC PT "6 Clicks" Mobility   Outcome Measure  Help needed turning from your back to your side  while in a flat bed without using bedrails?: None Help needed moving from lying on your back to sitting on the side of a flat bed without using bedrails?: None Help needed moving to and from a bed to a chair (including a wheelchair)?: A Little Help needed standing up from a chair using your arms (e.g., wheelchair or bedside chair)?: A Lot Help needed to walk in hospital room?: A Little Help needed climbing 3-5 steps with a railing? : A Lot 6 Click Score: 18    End of Session Equipment Utilized During Treatment: Gait belt Activity Tolerance: Patient tolerated treatment well Patient left: in chair;with call bell/phone within reach;with chair alarm set Nurse Communication: Mobility status PT Visit Diagnosis: Unsteadiness on feet (R26.81);Other abnormalities of gait and mobility (R26.89);Muscle weakness (generalized) (M62.81)     Time: 0051-1021 PT Time Calculation (min) (ACUTE ONLY): 23 min  Charges:  $Gait Training: 8-22 mins $Therapeutic Activity: 8-22 mins                     [address] SPTA   [place] 04/02/2018, 5:41 PM

## 2018-04-02 NOTE — Progress Notes (Signed)
PROGRESS NOTE                                                                                                                                                                                                             Patient Demographics:    Jeanne Leach, is a 52 y.o. female, DOB - [birth year], ZOX:[id number]  Admit date - 03/27/2018   Admitting Physician Eduard Clos, MD  Outpatient Primary MD for the patient is Jearld Lesch, MD  LOS - 6  Chief Complaint  Patient presents with  . Respiratory Distress       Brief Narrative - Jeanne Leach is a 52 y.o. female with history of hypertension, chronic pain, anxiety, chronic kidney disease, chronic anemia was found to be weak and drowsy by patient's family.  As per the ER physician and the patient patient's family had come to check on her today to take her for dinner with her other family members when patient was found to be drowsy weak and unable to ambulate because of the weakness.  EMS was called patient was found to be hypoxic weak hypotensive was given fluid bolus and brought to the ER, the ER work-up was suggestive of sepsis due to pyelonephritis along with possible narcotic and benzo overdose.   Subjective:   Patient in bed, appears comfortable, denies any headache, no fever, no chest pain or pressure, no shortness of breath , no abdominal pain. No focal weakness.    Assessment  & Plan :      1.  Toxic & Metabolic Encephalopathy due to combination of ARF, sepsis caused by pyelonephritis along with prescription drug abuse.  Treated conservatively, offending medications were held, she has been placed on antibiotics, being hydrated with IV fluids, mentation has improved.  2.  ARF.  Due to combination of rhabdomyolysis along with severe dehydration and pyelonephritis.  No hydronephrosis on CT scan, she has had a Foley catheter placed in the ER which I will continue to monitor intake and output strictly, she had  an unremarkable renal ultrasound, already on board Case discussed with Dr. Marisue Humble on 04/01/2018, renal function now finally improving with hydration which will be continued, good urine output creatinine trending down.  3.  Possible pneumonia on chest x-ray.  No cough or shortness of breath, stop doxycycline continue meropenem for UTI and pyelonephritis.  4.  Acute hypoxic and hypercapnic respiratory failure.  Likely due to poor mentation along with drug overdose and possible mild pneumonia.  Initially required  BiPAP now much improved, continue present line of care and monitor.  Currently on room air.  5.  Severe metabolic acidosis.  Due to combination of sepsis and renal failure, will place on low-dose bicarb drip and monitor.  6.  Anemia of chronic disease.  Monitor.  7.  Elevated LFTs likely due to shock liver from hypotension and dehydration.  Hydrate and monitor trend improving.  INR is 1.1 suggesting good synthetic function.  8.  Elevated d-dimer.  Due to infection.  VQ scan ordered upon admission has been done and results are pending will monitor clinical suspicion for PE is extremely low.  9.  Prescription drug abuse.  Counseled.  Urine drug screen ordered as well.  Tylenol and salicylate levels are stable.  10.  ESBL pyelonephritis.  Currently on meropenem which will be started on 03/29/2018 morning.  Clinically stable.    Family Communication  :  None  Code Status :  Full  Disposition Plan  : Medical  bed  Consults  :  None  Procedures  :    CT - 1. No CT evidence for nephrolithiasis or obstructive uropathy. 2. Mild hazy perinephric fat stranding about the kidneys bilaterally, right greater than left. Findings are nonspecific, but could reflect sequelae of acute upper urinary tract infection/pyelonephritis. Correlation with urinalysis recommended. Mild circumferential bladder wall thickening may be related to incomplete distension and/or concomitant cystitis. 3. Patchy  multifocal nodular tree-in-bud opacities within the visualized lung bases, suspicious for acute endobronchial infection/small airways disease. 4. No other acute abnormality within the abdomen and pelvis.  DVT Prophylaxis  :   Heparin    Lab Results  Component Value Date   PLT 248 03/31/2018    Diet :  Diet Order            Diet Heart Room service appropriate? Yes; Fluid consistency: Thin  Diet effective now               Inpatient Medications Scheduled Meds: . acetaminophen  650 mg Oral Once  . heparin injection (subcutaneous)  5,000 Units Subcutaneous Q8H  . mouth rinse  15 mL Mouth Rinse BID  . [START ON 04/03/2018] sodium bicarbonate  650 mg Oral Daily   Continuous Infusions: . lactated ringers    . meropenem (MERREM) IV 1 g (04/01/18 1206)   PRN Meds:.technetium TC 91M diethylenetriame-pentaacetic acid, traMADol  Antibiotics  :   Anti-infectives (From admission, onward)   Start     Dose/Rate Route Frequency Ordered Stop   03/29/18 1200  meropenem (MERREM) 1 g in sodium chloride 0.9 % 100 mL IVPB     1 g 200 mL/hr over 30 Minutes Intravenous Every 24 hours 03/29/18 1101 04/04/18 2359   03/28/18 1700  ceFEPIme (MAXIPIME) 1 g in sodium chloride 0.9 % 100 mL IVPB  Status:  Discontinued     1 g 200 mL/hr over 30 Minutes Intravenous Every 24 hours 03/27/18 2154 03/28/18 1303   03/28/18 1400  cefTRIAXone (ROCEPHIN) 1 g in sodium chloride 0.9 % 100 mL IVPB  Status:  Discontinued     1 g 200 mL/hr over 30 Minutes Intravenous Every 24 hours 03/28/18 1303 03/29/18 1036   03/28/18 1400  doxycycline (VIBRAMYCIN) 100 mg in sodium chloride 0.9 % 250 mL IVPB  Status:  Discontinued     100 mg 125 mL/hr over 120 Minutes Intravenous Every 12 hours 03/28/18 1307 04/01/18 1010   03/28/18 0200  metroNIDAZOLE (FLAGYL) IVPB 500 mg  Status:  Discontinued  500 mg 100 mL/hr over 60 Minutes Intravenous Every 8 hours 03/27/18 2139 03/28/18 1303   03/27/18 2154  vancomycin variable dose per  unstable renal function (pharmacist dosing)  Status:  Discontinued      Does not apply See admin instructions 03/27/18 2154 03/28/18 1303   03/27/18 2145  ceFEPIme (MAXIPIME) 2 g in sodium chloride 0.9 % 100 mL IVPB  Status:  Discontinued     2 g 200 mL/hr over 30 Minutes Intravenous  Once 03/27/18 2139 03/27/18 2145   03/27/18 2145  vancomycin (VANCOCIN) IVPB 1000 mg/200 mL premix  Status:  Discontinued     1,000 mg 200 mL/hr over 60 Minutes Intravenous  Once 03/27/18 2139 03/27/18 2146   03/27/18 1715  metroNIDAZOLE (FLAGYL) IVPB 500 mg     500 mg 100 mL/hr over 60 Minutes Intravenous  Once 03/27/18 1705 03/27/18 1856   03/27/18 1700  ceFEPIme (MAXIPIME) 2 g in sodium chloride 0.9 % 100 mL IVPB     2 g 200 mL/hr over 30 Minutes Intravenous  Once 03/27/18 1654 03/27/18 1804   03/27/18 1700  vancomycin (VANCOCIN) 2,000 mg in sodium chloride 0.9 % 500 mL IVPB     2,000 mg 250 mL/hr over 120 Minutes Intravenous  Once 03/27/18 1654 03/27/18 2008          Objective:   Vitals:   04/01/18 0800 04/01/18 1626 04/01/18 2216 04/02/18 0530  BP:  139/80 (!) 151/104 (!) 155/89  Pulse: 75 72 81 76  Resp: 14 19    Temp:  98.8 F (37.1 C) 99.1 F (37.3 C) 98.9 F (37.2 C)  TempSrc:  Oral Oral Oral  SpO2: 99% 98% 98% 99%  Weight:    111.2 kg  Height:        Wt Readings from Last 3 Encounters:  04/02/18 111.2 kg  05/16/15 90.7 kg  10/13/14 86.2 kg     Intake/Output Summary (Last 24 hours) at 04/02/2018 1054 Last data filed at 04/02/2018 1024 Gross per 24 hour  Intake 640 ml  Output 4450 ml  Net -3810 ml     Physical Exam  Awake Alert, Oriented X 3, No new F.N deficits, Normal affect [place].AT,PERRAL Supple Neck,No JVD, No cervical lymphadenopathy appriciated.  Symmetrical Chest wall movement, Good air movement bilaterally, CTAB RRR,No Gallops, Rubs or new Murmurs, No Parasternal Heave +ve B.Sounds, Abd Soft, No tenderness, No organomegaly appriciated, No rebound - guarding or  rigidity. No Cyanosis, Clubbing or edema, No new Rash or bruise    Data Review:    CBC Recent Labs  Lab 03/27/18 1654 03/27/18 2217 03/28/18 0108 03/29/18 0549 03/30/18 0432 03/31/18 0319  WBC 15.3* 13.7* 14.0* 12.8* 10.9* 10.0  HGB 11.7* 10.4* 11.1* 8.9* 8.8* 8.6*  HCT 37.4 31.8* 33.7* 27.1* 26.7* 26.7*  PLT 223 206 220 215 186 248  MCV 99.5 96.4 98.0 96.1 95.0 97.4  MCH 31.1 31.5 32.3 31.6 31.3 31.4  MCHC 31.3 32.7 32.9 32.8 33.0 32.2  RDW 12.9 12.9 13.1 13.1 12.7 13.0  LYMPHSABS 1.2  --  1.6 1.7 1.9 2.4  MONOABS 1.2*  --  1.2* 1.3* 1.1* 1.1*  EOSABS 0.2  --  0.0 0.1 0.2 0.4  BASOSABS 0.0  --  0.0 0.0 0.0 0.1    Chemistries  Recent Labs  Lab 03/28/18 0733 03/29/18 0549  03/30/18 0432 03/30/18 1415 03/31/18 0319 04/01/18 0444 04/02/18 0814  NA 135 137   < > 132* 134* 136 137 137  K 3.8 3.6   < >  3.5 3.8 3.7 3.7 3.5  CL 108 104   < > 103 102 101 101 102  CO2 15* 18*   < > 19* 19* 21* 19* 22  GLUCOSE 112* 105*   < > 94 125* 94 92 114*  BUN 60* 63*   < > 64* 65* 64* 63* 60*  CREATININE 8.48* 9.36*   < > 9.42* 9.37* 9.36* 8.92* 7.61*  CALCIUM 7.2* 7.9*   < > 8.0* 8.4* 8.3* 8.4* 8.8*  MG  --  1.9  --   --   --   --   --   --   AST 449* 303*  --  228*  --  147* 101*  --   ALT 210* 181*  --  148*  --  118* 91*  --   ALKPHOS 33* 35*  --  32*  --  36* 36*  --   BILITOT 0.5 0.7  --  0.8  --  0.6 0.6  --    < > = values in this interval not displayed.   ------------------------------------------------------------------------------------------------------------------ No results for input(s): CHOL, HDL, LDLCALC, TRIG, CHOLHDL, LDLDIRECT in the last 72 hours.  Lab Results  Component Value Date   HGBA1C 6.3 (H) 08/08/2014   ------------------------------------------------------------------------------------------------------------------ No results for input(s): TSH, T4TOTAL, T3FREE, THYROIDAB in the last 72 hours.  Invalid input(s):  FREET3 ------------------------------------------------------------------------------------------------------------------ No results for input(s): VITAMINB12, FOLATE, FERRITIN, TIBC, IRON, RETICCTPCT in the last 72 hours.  Coagulation profile Recent Labs  Lab 03/27/18 2217 03/28/18 0733  INR 1.16 1.21    No results for input(s): DDIMER in the last 72 hours.  Cardiac Enzymes Recent Labs  Lab 03/27/18 2217 03/28/18 0733 03/28/18 1140  TROPONINI 0.03* 0.03* 0.03*   ------------------------------------------------------------------------------------------------------------------    Component Value Date/Time   BNP 173.1 (H) 03/29/2018 0549    Micro Results Recent Results (from the past 240 hour(s))  Blood culture (routine x 2)     Status: None   Collection Time: 03/27/18  5:05 PM  Result Value Ref Range Status   Specimen Description SITE NOT SPECIFIED  Final   Special Requests   Final    BOTTLES DRAWN AEROBIC AND ANAEROBIC Blood Culture adequate volume   Culture   Final    NO GROWTH 5 DAYS Performed at [facility], [address] [place], [place] [address]    Report Status 04/01/2018 FINAL  Final  Urine culture     Status: Abnormal   Collection Time: 03/27/18  5:12 PM  Result Value Ref Range Status   Specimen Description URINE, RANDOM  Final   Special Requests   Final    NONE Performed at [facility], [address], [place], [place] [address]    Culture (A)  Final    >=100,000 COLONIES/mL ESCHERICHIA COLI Confirmed Extended Spectrum Beta-Lactamase Producer (ESBL).  In bloodstream infections from ESBL organisms, carbapenems are preferred over piperacillin/tazobactam. They are shown to have a lower risk of mortality.    Report Status 03/29/2018 FINAL  Final   Organism ID, Bacteria ESCHERICHIA COLI (A)  Final      Susceptibility   Escherichia coli - MIC*    AMPICILLIN >=32 RESISTANT Resistant     CEFAZOLIN >=64 RESISTANT Resistant     CEFTRIAXONE  >=64 RESISTANT Resistant     CIPROFLOXACIN >=4 RESISTANT Resistant     GENTAMICIN >=16 RESISTANT Resistant     IMIPENEM <=0.25 SENSITIVE Sensitive     NITROFURANTOIN <=16 SENSITIVE Sensitive  TRIMETH/SULFA <=20 SENSITIVE Sensitive     AMPICILLIN/SULBACTAM >=32 RESISTANT Resistant     PIP/TAZO <=4 SENSITIVE Sensitive     Extended ESBL POSITIVE Resistant     * >=100,000 COLONIES/mL ESCHERICHIA COLI  Blood culture (routine x 2)     Status: None   Collection Time: 03/27/18  5:15 PM  Result Value Ref Range Status   Specimen Description SITE NOT SPECIFIED  Final   Special Requests   Final    BOTTLES DRAWN AEROBIC AND ANAEROBIC Blood Culture results may not be optimal due to an inadequate volume of blood received in culture bottles   Culture   Final    NO GROWTH 5 DAYS Performed at [facility], [address] [place], [place] [address]    Report Status 04/01/2018 FINAL  Final  MRSA PCR Screening     Status: None   Collection Time: 03/27/18  9:53 PM  Result Value Ref Range Status   MRSA by PCR NEGATIVE NEGATIVE Final    Comment:        The GeneXpert MRSA Assay (FDA approved for NASAL specimens only), is one component of a comprehensive MRSA colonization surveillance program. It is not intended to diagnose MRSA infection nor to guide or monitor treatment for MRSA infections. Performed at [facility], [address] [place], [place] [address]   Culture, sputum-assessment     Status: None   Collection Time: 03/28/18  7:43 PM  Result Value Ref Range Status   Specimen Description EXPECTORATED SPUTUM  Final   Special Requests NONE  Final   Sputum evaluation   Final    THIS SPECIMEN IS ACCEPTABLE FOR SPUTUM CULTURE Performed at [facility], [address] [place], [place] [address]    Report Status 03/29/2018 FINAL  Final  Culture, respiratory     Status: None   Collection Time: 03/28/18  7:43 PM  Result Value Ref Range Status   Specimen Description  EXPECTORATED SPUTUM  Final   Special Requests NONE Reflexed from [id number]  Final   Gram Stain   Final    ABUNDANT WBC PRESENT,BOTH PMN AND MONONUCLEAR MODERATE GRAM VARIABLE ROD ABUNDANT YEAST MODERATE SQUAMOUS EPITHELIAL CELLS PRESENT Performed at [facility], [address] [place], [place] [address]    Culture FEW CANDIDA ALBICANS  Final   Report Status 04/01/2018 FINAL  Final    Radiology Reports Ct Head Wo Contrast  Result Date: 03/28/2018 CLINICAL DATA:  Altered mental status EXAM: CT HEAD WITHOUT CONTRAST TECHNIQUE: Contiguous axial images were obtained from the base of the skull through the vertex without intravenous contrast. COMPARISON:  Head CT 10/04/2015 FINDINGS: Brain: There is no mass, hemorrhage or extra-axial collection. The size and configuration of the ventricles and extra-axial CSF spaces are normal. The brain parenchyma is normal, without evidence of acute or chronic infarction. Vascular: No abnormal hyperdensity of the major intracranial arteries or dural venous sinuses. No intracranial atherosclerosis. Skull: The visualized skull base, calvarium and extracranial soft tissues are normal. Sinuses/Orbits: No fluid levels or advanced mucosal thickening of the visualized paranasal sinuses. No mastoid or middle ear effusion. The orbits are normal. IMPRESSION: Normal head CT. Electronically Signed   By: Deatra RobinsonKevin  Herman M.D.   On: 03/28/2018 06:01   Nm Pulmonary Perfusion  Result Date: 03/28/2018 CLINICAL DATA:  Acute respiratory failure with hypoxia. Droplet precautions for influenza. EXAM: NUCLEAR MEDICINE PERFUSION LUNG SCAN TECHNIQUE: Perfusion images were obtained in multiple projections after intravenous injection of  radiopharmaceutical. RADIOPHARMACEUTICALS:  4 millicurie mCi Tc-83m MAA IV COMPARISON:  03/27/2018 FINDINGS: The perfusion lung scan appears normal. No perfusion defect is identified to suggest pulmonary embolus. IMPRESSION: 1. Normal-appearing perfusion  lung scan. No perfusion defect to suggest pulmonary embolus. 2. Because the perfusion images were normal, and because the patient is on droplet precautions, I felt that ventilation images would be problematic to obtain and not helpful for diagnosis, and accordingly today's exam is performed as a perfusion lung scan only rather than a ventilation-perfusion scan. Electronically Signed   By: Gaylyn Rong M.D.   On: 03/28/2018 13:09   US Renal  Result Date: 03/28/2018 CLINICAL DATA:  52 y/o  F; acute renal failure. EXAM: RENAL / URINARY TRACT ULTRASOUND COMPLETE COMPARISON:  03/27/2018 CT abdomen and pelvis. FINDINGS: Right Kidney: Renal measurements: 11.4 x 6.3 x 5.9 cm = volume: 222 mL . Echogenicity within normal limits. No mass or hydronephrosis visualized. Prominent interpolar kidney Bertin column. Left Kidney: Renal measurements: 12.1 x 6.1 x 4.6 cm = volume: 293 mL. Echogenicity within normal limits. No mass or hydronephrosis visualized. Bladder: Appears normal for degree of bladder distention. Foley catheter in situ. IMPRESSION: Normal renal ultrasound. Electronically Signed   By: Mitzi Hansen M.D.   On: 03/28/2018 16:00   Dg Chest Port 1 View  Result Date: 03/29/2018 CLINICAL DATA:  New onset persistent cough this morning. Acute kidney injury. Acute respiratory failure with hypoxia. EXAM: PORTABLE CHEST 1 VIEW COMPARISON:  Radiographs 11/03/2014 and 03/27/2018. Chest CT 10/04/2015. Abdominal CT 03/27/2018. FINDINGS: 0749 hours. The heart size and mediastinal contours are normal. There are low lung volumes with mild interstitial prominence in both lung bases, similar to recent prior study. The pulmonary vascularity is normal. There is no confluent airspace opacity, pleural effusion or pneumothorax. The bones appear unchanged. IMPRESSION: Interstitial prominence at both lung bases, similar to recent prior study and likely due to a combination of atelectasis and peribronchial  inflammation as correlated with recent abdominal CT. No consolidation or significant pleural effusion. Electronically Signed   By: Carey Bullocks M.D.   On: 03/29/2018 08:18   Dg Chest Portable 1 View  Result Date: 03/27/2018 CLINICAL DATA:  Severe cough hypoxia EXAM: PORTABLE CHEST 1 VIEW COMPARISON:  CT 10/04/2015, radiograph 11/03/2014 FINDINGS: No focal consolidation or pleural effusion. Stable cardiomediastinal silhouette. No pneumothorax. IMPRESSION: No active disease. Electronically Signed   By: Jasmine Pang M.D.   On: 03/27/2018 17:54   Ct Renal Stone Study  Result Date: 03/27/2018 CLINICAL DATA:  Initial evaluation for acute bilateral flank pain. EXAM: CT ABDOMEN AND PELVIS WITHOUT CONTRAST TECHNIQUE: Multidetector CT imaging of the abdomen and pelvis was performed following the standard protocol without IV contrast. COMPARISON:  Prior CT from 10/04/2015. FINDINGS: Lower chest: Dependent atelectatic changes noted within the visualized lung bases. Few additional scattered tree-in-bud nodular densities noted, nonspecific, but suggestive of possible endobronchial infection/small airways disease. Hepatobiliary: Liver demonstrates a normal unenhanced appearance. Gallbladder within normal limits. No biliary dilatation. Pancreas: Mild diffuse fatty infiltration the pancreas noted. Pancreas otherwise unremarkable without acute inflammatory changes. Spleen: Spleen within normal limits. Adrenals/Urinary Tract: Adrenal glands are normal. Kidneys equal in size. No nephrolithiasis or hydronephrosis. No radiopaque calculi seen along the course of either renal collecting system. No hydroureter. Subtle hazy perinephric fat stranding seen about the kidneys bilaterally, right slightly greater than left, raising the possibility for possible upper urinary tract infection. No perinephric collections seen on this noncontrast examination. No layering stones within the bladder lumen. Mild  circumferential wall  thickening about the bladder. Stomach/Bowel: Stomach within normal limits. No evidence for bowel obstruction. Normal appendix. No acute inflammatory changes seen about the bowels. Vascular/Lymphatic: Mild aorto bi-iliac atherosclerotic disease. No aneurysm. No adenopathy. Reproductive: Uterus and ovaries within normal limits. Other: No free air or fluid. Small fat containing paraumbilical hernia noted. Musculoskeletal: No acute osseous abnormality. No worrisome lytic or blastic osseous lesions. Mild multilevel facet arthropathy noted within the lumbar spine. IMPRESSION: 1. No CT evidence for nephrolithiasis or obstructive uropathy. 2. Mild hazy perinephric fat stranding about the kidneys bilaterally, right greater than left. Findings are nonspecific, but could reflect sequelae of acute upper urinary tract infection/pyelonephritis. Correlation with urinalysis recommended. Mild circumferential bladder wall thickening may be related to incomplete distension and/or concomitant cystitis. 3. Patchy multifocal nodular tree-in-bud opacities within the visualized lung bases, suspicious for acute endobronchial infection/small airways disease. 4. No other acute abnormality within the abdomen and pelvis. Electronically Signed   By: Rise Mu M.D.   On: 03/27/2018 20:09    Time Spent in minutes  30   Susa Raring M.D on 04/02/2018 at 10:54 AM  To page go to [email or web address] - password [facility]

## 2018-04-03 LAB — BASIC METABOLIC PANEL
ANION GAP: 12 (ref 5–15)
BUN: 53 mg/dL — ABNORMAL HIGH (ref 6–20)
CO2: 27 mmol/L (ref 22–32)
Calcium: 8.8 mg/dL — ABNORMAL LOW (ref 8.9–10.3)
Chloride: 99 mmol/L (ref 98–111)
Creatinine, Ser: 6.91 mg/dL — ABNORMAL HIGH (ref 0.44–1.00)
GFR calc Af Amer: 7 mL/min — ABNORMAL LOW (ref >60)
GFR calc non Af Amer: 6 mL/min — ABNORMAL LOW (ref >60)
Glucose, Bld: 97 mg/dL (ref 70–99)
POTASSIUM: 3.8 mmol/L (ref 3.5–5.1)
Sodium: 138 mmol/L (ref 135–145)

## 2018-04-03 MED ORDER — TRAMADOL HCL 50 MG PO TABS
100.0000 mg | ORAL_TABLET | Freq: Two times a day (BID) | ORAL | Status: DC
  Administered 2018-04-03 – 2018-04-04 (×2): 100 mg via ORAL
  Filled 2018-04-03 (×2): qty 2

## 2018-04-03 MED ORDER — LACTATED RINGERS IV SOLN
INTRAVENOUS | Status: DC
  Administered 2018-04-03: 08:00:00 via INTRAVENOUS

## 2018-04-03 NOTE — Progress Notes (Signed)
PROGRESS NOTE                                                                                                                                                                                                             Patient Demographics:    Jeanne Leach, is a 52 y.o. female, DOB - 07/06/1965, WUJ:[id number]  Admit date - 03/27/2018   Admitting Physician Eduard Clos, MD  Outpatient Primary MD for the patient is Jearld Lesch, MD  LOS - 7  Chief Complaint  Patient presents with  . Respiratory Distress       Brief Narrative - Jeanne Leach is a 52 y.o. female with history of hypertension, chronic pain, anxiety, chronic kidney disease, chronic anemia was found to be weak and drowsy by patient's family.  As per the ER physician and the patient patient's family had come to check on her today to take her for dinner with her other family members when patient was found to be drowsy weak and unable to ambulate because of the weakness.  EMS was called patient was found to be hypoxic weak hypotensive was given fluid bolus and brought to the ER, the ER work-up was suggestive of sepsis due to pyelonephritis along with possible narcotic and benzo overdose along with severe rhabdomyolysis causing ARF.   Subjective:   Patient in bed, appears comfortable, denies any headache, no fever, no chest pain or pressure, no shortness of breath , no abdominal pain. No focal weakness.    Assessment  & Plan :      1.  Toxic & Metabolic Encephalopathy due to combination of ARF, sepsis caused by pyelonephritis & prescription drug abuse.  Treated conservatively, trending medications were held and she was placed on appropriate IV antibiotics and aggressively hydrated with IV fluids, mentation back to baseline.  2.  ARF.  Due to combination of rhabdomyolysis along with severe dehydration and pyelonephritis.  Hydronephrosis on CT scan or renal ultrasound, she had Foley catheter placed for  strict intake and output monitoring, Foley catheter discontinued on 04/03/2018.  Has been also seen by renal.  Mainstay of treatment was aggressive IV fluids for hydration after which rhabdomyolysis and renal failure have improved, continue IV fluids for another 24 hours if creatinine continues to downtrend may be discharged on 24 2019.  3.  Possible pneumonia on chest x-ray.  No cough or shortness of breath, stopped doxycycline continue meropenem for UTI and pyelonephritis.  [name] resolved.  4.  Acute hypoxic and  hypercapnic respiratory failure.  Likely due to poor mentation along with drug overdose and possible mild pneumonia.  Initially required BiPAP now much improved, continue present line of care and monitor.  Currently on room air.  5.  Severe metabolic acidosis.  Due to combination of sepsis and renal failure, she initially required oral bicarb will now stop..  6.  Anemia of chronic disease.  Monitor.  7.  Elevated LFTs likely due to shock liver from hypotension and dehydration.  Hydrate and monitor trend improving.  INR is 1.1 suggesting good synthetic function.  8.  Elevated d-dimer.  Due to infection.  VQ scan ordered upon admission was done and negative for any perfusion defect.  9.  Prescription drug abuse.  Counseled.  Urine drug screen ordered as well.  Tylenol and salicylate levels are stable.  10.  ESBL pyelonephritis.  Currently on meropenem was started after sensitivity back on 05/29/2017, finished treatment with 7 days on 04/04/2018.    Family Communication  :  None  Code Status :  Full  Disposition Plan  : Medical  bed  Consults  :  None  Procedures  :    CT - 1. No CT evidence for nephrolithiasis or obstructive uropathy. 2. Mild hazy perinephric fat stranding about the kidneys bilaterally, right greater than left. Findings are nonspecific, but could reflect sequelae of acute upper urinary tract infection/pyelonephritis. Correlation with urinalysis recommended. Mild  circumferential bladder wall thickening may be related to incomplete distension and/or concomitant cystitis. 3. Patchy multifocal nodular tree-in-bud opacities within the visualized lung bases, suspicious for acute endobronchial infection/small airways disease. 4. No other acute abnormality within the abdomen and pelvis.  DVT Prophylaxis  :   Heparin    Lab Results  Component Value Date   PLT 248 03/31/2018    Diet :  Diet Order            Diet Heart Room service appropriate? Yes; Fluid consistency: Thin  Diet effective now               Inpatient Medications Scheduled Meds: . acetaminophen  650 mg Oral Once  . heparin injection (subcutaneous)  5,000 Units Subcutaneous Q8H  . mouth rinse  15 mL Mouth Rinse BID  . sodium bicarbonate  650 mg Oral Daily   Continuous Infusions: . lactated ringers 75 mL/hr at 04/03/18 0732  . meropenem (MERREM) IV 1 g (04/03/18 1103)   PRN Meds:.technetium TC 42M diethylenetriame-pentaacetic acid, traMADol  Antibiotics  :   Anti-infectives (From admission, onward)   Start     Dose/Rate Route Frequency Ordered Stop   03/29/18 1200  meropenem (MERREM) 1 g in sodium chloride 0.9 % 100 mL IVPB     1 g 200 mL/hr over 30 Minutes Intravenous Every 24 hours 03/29/18 1101 04/04/18 2359   03/28/18 1700  ceFEPIme (MAXIPIME) 1 g in sodium chloride 0.9 % 100 mL IVPB  Status:  Discontinued     1 g 200 mL/hr over 30 Minutes Intravenous Every 24 hours 03/27/18 2154 03/28/18 1303   03/28/18 1400  cefTRIAXone (ROCEPHIN) 1 g in sodium chloride 0.9 % 100 mL IVPB  Status:  Discontinued     1 g 200 mL/hr over 30 Minutes Intravenous Every 24 hours 03/28/18 1303 03/29/18 1036   03/28/18 1400  doxycycline (VIBRAMYCIN) 100 mg in sodium chloride 0.9 % 250 mL IVPB  Status:  Discontinued     100 mg 125 mL/hr over 120 Minutes Intravenous Every 12 hours 03/28/18 1307 04/01/18  1010   03/28/18 0200  metroNIDAZOLE (FLAGYL) IVPB 500 mg  Status:  Discontinued     500 mg 100  mL/hr over 60 Minutes Intravenous Every 8 hours 03/27/18 2139 03/28/18 1303   03/27/18 2154  vancomycin variable dose per unstable renal function (pharmacist dosing)  Status:  Discontinued      Does not apply See admin instructions 03/27/18 2154 03/28/18 1303   03/27/18 2145  ceFEPIme (MAXIPIME) 2 g in sodium chloride 0.9 % 100 mL IVPB  Status:  Discontinued     2 g 200 mL/hr over 30 Minutes Intravenous  Once 03/27/18 2139 03/27/18 2145   03/27/18 2145  vancomycin (VANCOCIN) IVPB 1000 mg/200 mL premix  Status:  Discontinued     1,000 mg 200 mL/hr over 60 Minutes Intravenous  Once 03/27/18 2139 03/27/18 2146   03/27/18 1715  metroNIDAZOLE (FLAGYL) IVPB 500 mg     500 mg 100 mL/hr over 60 Minutes Intravenous  Once 03/27/18 1705 03/27/18 1856   03/27/18 1700  ceFEPIme (MAXIPIME) 2 g in sodium chloride 0.9 % 100 mL IVPB     2 g 200 mL/hr over 30 Minutes Intravenous  Once 03/27/18 1654 03/27/18 1804   03/27/18 1700  vancomycin (VANCOCIN) 2,000 mg in sodium chloride 0.9 % 500 mL IVPB     2,000 mg 250 mL/hr over 120 Minutes Intravenous  Once 03/27/18 1654 03/27/18 2008          Objective:   Vitals:   04/02/18 0530 04/02/18 1447 04/02/18 2157 04/03/18 0518  BP: (!) 155/89 (!) 160/97 (!) 133/93 117/79  Pulse: 76 75 73 70  Resp:   18 18  Temp: 98.9 F (37.2 C) 99.2 F (37.3 C) 98.6 F (37 C) 98.8 F (37.1 C)  TempSrc: Oral Oral Oral Oral  SpO2: 99% 98% 95% 99%  Weight: 111.2 kg   109 kg  Height:        Wt Readings from Last 3 Encounters:  04/03/18 109 kg  05/16/15 90.7 kg  10/13/14 86.2 kg     Intake/Output Summary (Last 24 hours) at 04/03/2018 1123 Last data filed at 04/03/2018 1100 Gross per 24 hour  Intake 1666 ml  Output 3950 ml  Net -2284 ml     Physical Exam  Awake Alert, Oriented X 3, No new F.N deficits, Normal affect [place].AT,PERRAL Supple Neck,No JVD, No cervical lymphadenopathy appriciated.  Symmetrical Chest wall movement, Good air movement bilaterally,  CTAB RRR,No Gallops, Rubs or new Murmurs, No Parasternal Heave +ve B.Sounds, Abd Soft, No tenderness, No organomegaly appriciated, No rebound - guarding or rigidity. No Cyanosis, Clubbing or edema, No new Rash or bruise    Data Review:    CBC Recent Labs  Lab 03/27/18 1654 03/27/18 2217 03/28/18 0108 03/29/18 0549 03/30/18 0432 03/31/18 0319  WBC 15.3* 13.7* 14.0* 12.8* 10.9* 10.0  HGB 11.7* 10.4* 11.1* 8.9* 8.8* 8.6*  HCT 37.4 31.8* 33.7* 27.1* 26.7* 26.7*  PLT 223 206 220 215 186 248  MCV 99.5 96.4 98.0 96.1 95.0 97.4  MCH 31.1 31.5 32.3 31.6 31.3 31.4  MCHC 31.3 32.7 32.9 32.8 33.0 32.2  RDW 12.9 12.9 13.1 13.1 12.7 13.0  LYMPHSABS 1.2  --  1.6 1.7 1.9 2.4  MONOABS 1.2*  --  1.2* 1.3* 1.1* 1.1*  EOSABS 0.2  --  0.0 0.1 0.2 0.4  BASOSABS 0.0  --  0.0 0.0 0.0 0.1    Chemistries  Recent Labs  Lab 03/28/18 0733 03/29/18 0549  03/30/18 0432 03/30/18 1415  03/31/18 0319 04/01/18 0444 04/02/18 0814 04/03/18 0322  NA 135 137   < > 132* 134* 136 137 137 138  K 3.8 3.6   < > 3.5 3.8 3.7 3.7 3.5 3.8  CL 108 104   < > 103 102 101 101 102 99  CO2 15* 18*   < > 19* 19* 21* 19* 22 27  GLUCOSE 112* 105*   < > 94 125* 94 92 114* 97  BUN 60* 63*   < > 64* 65* 64* 63* 60* 53*  CREATININE 8.48* 9.36*   < > 9.42* 9.37* 9.36* 8.92* 7.61* 6.91*  CALCIUM 7.2* 7.9*   < > 8.0* 8.4* 8.3* 8.4* 8.8* 8.8*  MG  --  1.9  --   --   --   --   --   --   --   AST 449* 303*  --  228*  --  147* 101*  --   --   ALT 210* 181*  --  148*  --  118* 91*  --   --   ALKPHOS 33* 35*  --  32*  --  36* 36*  --   --   BILITOT 0.5 0.7  --  0.8  --  0.6 0.6  --   --    < > = values in this interval not displayed.   ------------------------------------------------------------------------------------------------------------------ No results for input(s): CHOL, HDL, LDLCALC, TRIG, CHOLHDL, LDLDIRECT in the last 72 hours.  Lab Results  Component Value Date   HGBA1C 6.3 (H) 08/08/2014    ------------------------------------------------------------------------------------------------------------------ No results for input(s): TSH, T4TOTAL, T3FREE, THYROIDAB in the last 72 hours.  Invalid input(s): FREET3 ------------------------------------------------------------------------------------------------------------------ No results for input(s): VITAMINB12, FOLATE, FERRITIN, TIBC, IRON, RETICCTPCT in the last 72 hours.  Coagulation profile Recent Labs  Lab 03/27/18 2217 03/28/18 0733  INR 1.16 1.21    No results for input(s): DDIMER in the last 72 hours.  Cardiac Enzymes Recent Labs  Lab 03/27/18 2217 03/28/18 0733 03/28/18 1140  TROPONINI 0.03* 0.03* 0.03*   ------------------------------------------------------------------------------------------------------------------    Component Value Date/Time   BNP 173.1 (H) 03/29/2018 0549    Micro Results Recent Results (from the past 240 hour(s))  Blood culture (routine x 2)     Status: None   Collection Time: 03/27/18  5:05 PM  Result Value Ref Range Status   Specimen Description SITE NOT SPECIFIED  Final   Special Requests   Final    BOTTLES DRAWN AEROBIC AND ANAEROBIC Blood Culture adequate volume   Culture   Final    NO GROWTH 5 DAYS Performed at [facility], [address], [place], [place] [address]    Report Status 04/01/2018 FINAL  Final  Urine culture     Status: Abnormal   Collection Time: 03/27/18  5:12 PM  Result Value Ref Range Status   Specimen Description URINE, RANDOM  Final   Special Requests   Final    NONE Performed at [facility], [address], [place], [place] [address]    Culture (A)  Final    >=100,000 COLONIES/mL ESCHERICHIA COLI Confirmed Extended Spectrum Beta-Lactamase Producer (ESBL).  In bloodstream infections from ESBL organisms, carbapenems are preferred over piperacillin/tazobactam. They are shown to have a lower risk of mortality.    Report Status  03/29/2018 FINAL  Final   Organism ID, Bacteria ESCHERICHIA COLI (A)  Final      Susceptibility   Escherichia coli - MIC*    AMPICILLIN >=32 RESISTANT  Resistant     CEFAZOLIN >=64 RESISTANT Resistant     CEFTRIAXONE >=64 RESISTANT Resistant     CIPROFLOXACIN >=4 RESISTANT Resistant     GENTAMICIN >=16 RESISTANT Resistant     IMIPENEM <=0.25 SENSITIVE Sensitive     NITROFURANTOIN <=16 SENSITIVE Sensitive     TRIMETH/SULFA <=20 SENSITIVE Sensitive     AMPICILLIN/SULBACTAM >=32 RESISTANT Resistant     PIP/TAZO <=4 SENSITIVE Sensitive     Extended ESBL POSITIVE Resistant     * >=100,000 COLONIES/mL ESCHERICHIA COLI  Blood culture (routine x 2)     Status: None   Collection Time: 03/27/18  5:15 PM  Result Value Ref Range Status   Specimen Description SITE NOT SPECIFIED  Final   Special Requests   Final    BOTTLES DRAWN AEROBIC AND ANAEROBIC Blood Culture results may not be optimal due to an inadequate volume of blood received in culture bottles   Culture   Final    NO GROWTH 5 DAYS Performed at [facility], [address], [place], [place] [address]    Report Status 04/01/2018 FINAL  Final  MRSA PCR Screening     Status: None   Collection Time: 03/27/18  9:53 PM  Result Value Ref Range Status   MRSA by PCR NEGATIVE NEGATIVE Final    Comment:        The GeneXpert MRSA Assay (FDA approved for NASAL specimens only), is one component of a comprehensive MRSA colonization surveillance program. It is not intended to diagnose MRSA infection nor to guide or monitor treatment for MRSA infections. Performed at [facility], [address] [place], [place] [address]   Culture, sputum-assessment     Status: None   Collection Time: 03/28/18  7:43 PM  Result Value Ref Range Status   Specimen Description EXPECTORATED SPUTUM  Final   Special Requests NONE  Final   Sputum evaluation   Final    THIS SPECIMEN IS ACCEPTABLE FOR SPUTUM CULTURE Performed at [facility]  Lab, [address], [place], [place] [address]    Report Status 03/29/2018 FINAL  Final  Culture, respiratory     Status: None   Collection Time: 03/28/18  7:43 PM  Result Value Ref Range Status   Specimen Description EXPECTORATED SPUTUM  Final   Special Requests NONE Reflexed from [id number]  Final   Gram Stain   Final    ABUNDANT WBC PRESENT,BOTH PMN AND MONONUCLEAR MODERATE GRAM VARIABLE ROD ABUNDANT YEAST MODERATE SQUAMOUS EPITHELIAL CELLS PRESENT Performed at [facility], [address] [place], [place] [address]    Culture FEW CANDIDA ALBICANS  Final   Report Status 04/01/2018 FINAL  Final    Radiology Reports Ct Head Wo Contrast  Result Date: 03/28/2018 CLINICAL DATA:  Altered mental status EXAM: CT HEAD WITHOUT CONTRAST TECHNIQUE: Contiguous axial images were obtained from the base of the skull through the vertex without intravenous contrast. COMPARISON:  Head CT 10/04/2015 FINDINGS: Brain: There is no mass, hemorrhage or extra-axial collection. The size and configuration of the ventricles and extra-axial CSF spaces are normal. The brain parenchyma is normal, without evidence of acute or chronic infarction. Vascular: No abnormal hyperdensity of the major intracranial arteries or dural venous sinuses. No intracranial atherosclerosis. Skull: The visualized skull base, calvarium and extracranial soft tissues are normal. Sinuses/Orbits: No fluid levels or advanced mucosal thickening of the visualized paranasal sinuses. No mastoid or middle ear effusion. The orbits are normal. IMPRESSION: Normal head CT.  Electronically Signed   By: Deatra Robinson M.D.   On: 03/28/2018 06:01   Nm Pulmonary Perfusion  Result Date: 03/28/2018 CLINICAL DATA:  Acute respiratory failure with hypoxia. Droplet precautions for influenza. EXAM: NUCLEAR MEDICINE PERFUSION LUNG SCAN TECHNIQUE: Perfusion images were obtained in multiple projections after intravenous injection of radiopharmaceutical.  RADIOPHARMACEUTICALS:  4 millicurie mCi Tc-67m MAA IV COMPARISON:  03/27/2018 FINDINGS: The perfusion lung scan appears normal. No perfusion defect is identified to suggest pulmonary embolus. IMPRESSION: 1. Normal-appearing perfusion lung scan. No perfusion defect to suggest pulmonary embolus. 2. Because the perfusion images were normal, and because the patient is on droplet precautions, I felt that ventilation images would be problematic to obtain and not helpful for diagnosis, and accordingly today's exam is performed as a perfusion lung scan only rather than a ventilation-perfusion scan. Electronically Signed   By: Gaylyn Rong M.D.   On: 03/28/2018 13:09   US Renal  Result Date: 03/28/2018 CLINICAL DATA:  52 y/o  F; acute renal failure. EXAM: RENAL / URINARY TRACT ULTRASOUND COMPLETE COMPARISON:  03/27/2018 CT abdomen and pelvis. FINDINGS: Right Kidney: Renal measurements: 11.4 x 6.3 x 5.9 cm = volume: 222 mL . Echogenicity within normal limits. No mass or hydronephrosis visualized. Prominent interpolar kidney Bertin column. Left Kidney: Renal measurements: 12.1 x 6.1 x 4.6 cm = volume: 293 mL. Echogenicity within normal limits. No mass or hydronephrosis visualized. Bladder: Appears normal for degree of bladder distention. Foley catheter in situ. IMPRESSION: Normal renal ultrasound. Electronically Signed   By: Mitzi Hansen M.D.   On: 03/28/2018 16:00   Dg Chest Port 1 View  Result Date: 03/29/2018 CLINICAL DATA:  New onset persistent cough this morning. Acute kidney injury. Acute respiratory failure with hypoxia. EXAM: PORTABLE CHEST 1 VIEW COMPARISON:  Radiographs 11/03/2014 and 03/27/2018. Chest CT 10/04/2015. Abdominal CT 03/27/2018. FINDINGS: 0749 hours. The heart size and mediastinal contours are normal. There are low lung volumes with mild interstitial prominence in both lung bases, similar to recent prior study. The pulmonary vascularity is normal. There is no confluent  airspace opacity, pleural effusion or pneumothorax. The bones appear unchanged. IMPRESSION: Interstitial prominence at both lung bases, similar to recent prior study and likely due to a combination of atelectasis and peribronchial inflammation as correlated with recent abdominal CT. No consolidation or significant pleural effusion. Electronically Signed   By: Carey Bullocks M.D.   On: 03/29/2018 08:18   Dg Chest Portable 1 View  Result Date: 03/27/2018 CLINICAL DATA:  Severe cough hypoxia EXAM: PORTABLE CHEST 1 VIEW COMPARISON:  CT 10/04/2015, radiograph 11/03/2014 FINDINGS: No focal consolidation or pleural effusion. Stable cardiomediastinal silhouette. No pneumothorax. IMPRESSION: No active disease. Electronically Signed   By: Jasmine Pang M.D.   On: 03/27/2018 17:54   Ct Renal Stone Study  Result Date: 03/27/2018 CLINICAL DATA:  Initial evaluation for acute bilateral flank pain. EXAM: CT ABDOMEN AND PELVIS WITHOUT CONTRAST TECHNIQUE: Multidetector CT imaging of the abdomen and pelvis was performed following the standard protocol without IV contrast. COMPARISON:  Prior CT from 10/04/2015. FINDINGS: Lower chest: Dependent atelectatic changes noted within the visualized lung bases. Few additional scattered tree-in-bud nodular densities noted, nonspecific, but suggestive of possible endobronchial infection/small airways disease. Hepatobiliary: Liver demonstrates a normal unenhanced appearance. Gallbladder within normal limits. No biliary dilatation. Pancreas: Mild diffuse fatty infiltration the pancreas noted. Pancreas otherwise unremarkable without acute inflammatory changes. Spleen: Spleen within normal limits. Adrenals/Urinary Tract: Adrenal glands are normal. Kidneys equal in size. No nephrolithiasis or hydronephrosis. No  radiopaque calculi seen along the course of either renal collecting system. No hydroureter. Subtle hazy perinephric fat stranding seen about the kidneys bilaterally, right slightly  greater than left, raising the possibility for possible upper urinary tract infection. No perinephric collections seen on this noncontrast examination. No layering stones within the bladder lumen. Mild circumferential wall thickening about the bladder. Stomach/Bowel: Stomach within normal limits. No evidence for bowel obstruction. Normal appendix. No acute inflammatory changes seen about the bowels. Vascular/Lymphatic: Mild aorto bi-iliac atherosclerotic disease. No aneurysm. No adenopathy. Reproductive: Uterus and ovaries within normal limits. Other: No free air or fluid. Small fat containing paraumbilical hernia noted. Musculoskeletal: No acute osseous abnormality. No worrisome lytic or blastic osseous lesions. Mild multilevel facet arthropathy noted within the lumbar spine. IMPRESSION: 1. No CT evidence for nephrolithiasis or obstructive uropathy. 2. Mild hazy perinephric fat stranding about the kidneys bilaterally, right greater than left. Findings are nonspecific, but could reflect sequelae of acute upper urinary tract infection/pyelonephritis. Correlation with urinalysis recommended. Mild circumferential bladder wall thickening may be related to incomplete distension and/or concomitant cystitis. 3. Patchy multifocal nodular tree-in-bud opacities within the visualized lung bases, suspicious for acute endobronchial infection/small airways disease. 4. No other acute abnormality within the abdomen and pelvis. Electronically Signed   By: Rise MuBenjamin  McClintock M.D.   On: 03/27/2018 20:09    Time Spent in minutes  30   Susa RaringPrashant Daren Yeagle M.D on 04/03/2018 at 11:23 AM  To page go to [email or web address] - password [email or web address]

## 2018-04-03 NOTE — Progress Notes (Signed)
  [facility] Progress Note    Assessment/ Plan:   1. AKI fromrhabdomyolysis and likely resultantATN; UOP improving--expect to continue robust autodiuresis and improving creatinine.   2. Rhabdomyolysis after being in bed x5d- stop IVFs  3. ESBL E. Coli UTI, ? Pyelonephritis, on meropenem 4. Metabolic Acidosis, inc AG, from #1, on oral bicarb 5. Anemia, stable, normocytic,no intervention required 6. Dispo: hopefully home tomorrow- would expect Cr to return to normal, if there is residual CKD as detected by PCP, can follow up at our office, otherwise none needed  Subjective:    Doing well, cr continues to come down   Objective:   BP 117/79 (BP Location: Right Arm)   Pulse 70   Temp 98.8 F (37.1 C) (Oral)   Resp 18   Ht 5\' 5"  (1.651 m)   Wt 109 kg   SpO2 99%   BMI 39.99 kg/m   Intake/Output Summary (Last 24 hours) at 04/03/2018 1223 Last data filed at 04/03/2018 1100 Gross per 24 hour  Intake 1666 ml  Output 3950 ml  Net -2284 ml   Weight change: -2.2 kg  Physical Exam: Gen: NAD CVS: RRR Resp: clear bilaterally no c/w/r Abd: soft nontender NABS Ext: no LE edema  Imaging: No results found.  Labs: BMET Recent Labs  Lab 03/29/18 0549 03/29/18 1612 03/30/18 0432 03/30/18 1415 03/31/18 0319 04/01/18 0444 04/02/18 0814 04/03/18 0322  NA 137 136 132* 134* 136 137 137 138  K 3.6 3.9 3.5 3.8 3.7 3.7 3.5 3.8  CL 104 105 103 102 101 101 102 99  CO2 18* 17* 19* 19* 21* 19* 22 27  GLUCOSE 105* 114* 94 125* 94 92 114* 97  BUN 63* 64* 64* 65* 64* 63* 60* 53*  CREATININE 9.36* 9.15* 9.42* 9.37* 9.36* 8.92* 7.61* 6.91*  CALCIUM 7.9* 8.2* 8.0* 8.4* 8.3* 8.4* 8.8* 8.8*  PHOS 6.3*  --   --   --   --   --   --   --    CBC Recent Labs  Lab 03/28/18 0108 03/29/18 0549 03/30/18 0432 03/31/18 0319  WBC 14.0* 12.8* 10.9* 10.0  NEUTROABS 11.1* 9.6* 7.5 6.0  HGB 11.1* 8.9* 8.8* 8.6*  HCT 33.7* 27.1* 26.7* 26.7*  MCV 98.0 96.1 95.0 97.4  PLT 220 215  186 248    Medications:    . acetaminophen  650 mg Oral Once  . heparin injection (subcutaneous)  5,000 Units Subcutaneous Q8H  . mouth rinse  15 mL Mouth Rinse BID  . traMADol  100 mg Oral Q12H      Bufford ButtnerElizabeth Sitara Cashwell, MD 04/03/2018, 12:23 PM

## 2018-04-03 NOTE — Progress Notes (Signed)
Patient is alert and oriented, patient requesting prn tramadol q8hr as ordered. Pain controlled by medication, patient able to transfer self from chair to bed with supervision. No weakness noted. No acute distress noted

## 2018-04-03 NOTE — Progress Notes (Signed)
Physical Therapy Treatment Patient Details Name: Sammuel HinesDeborah Hyams MRN: [id number] DOB: October 11, 1965 Today's Date: 04/03/2018    History of Present Illness Pt is a 52 y.o. female with a PMH consisting of HTN, CKD, Bipolar, and chronic pain presents to the ED with weakness and AMS. Pt had spent the previous 5 days laying in bed due to the weakness. Pt found to have toxic & metabolic encephalopathy, and rhabdomyolysis.    PT Comments    Pt in bed upon arrival with family at bedside. Pt willing to participate in therapy. Pt participated in gait training with a RW for 150 ft requiring Min guard for safety. Pt required supervision with bed mobility and transfers this session. Pt would benefit from continued PT in order to progress toward stated goals and maximize functional mobility. Pt remains appropriate for HHPT based on current functional status and support at home.      Follow Up Recommendations  Home health PT;Supervision/Assistance - 24 hour     Equipment Recommendations  Rolling walker with 5" wheels    Recommendations for Other Services       Precautions / Restrictions Precautions Precautions: Fall Restrictions Weight Bearing Restrictions: No    Mobility  Bed Mobility Overal bed mobility: Needs Assistance Bed Mobility: Supine to Sit           General bed mobility comments: supervision for safety  Transfers Overall transfer level: Needs assistance Equipment used: Rolling walker (2 wheeled) Transfers: Sit to/from Stand Sit to Stand: Supervision         General transfer comment: Supervision for safety and VC for safe hand placement  Ambulation/Gait Ambulation/Gait assistance: Min guard Gait Distance (Feet): 150 Feet Assistive device: Rolling walker (2 wheeled) Gait Pattern/deviations: Decreased stride length;Decreased dorsiflexion - right;Decreased dorsiflexion - left;Trunk flexed     General Gait Details: Pt required Min guard for safety and VC for proximity of  RW. VC also needed to maintain upright posture.   Stairs             Wheelchair Mobility    Modified Rankin (Stroke Patients Only)       Balance Overall balance assessment: Needs assistance Sitting-balance support: No upper extremity supported;Feet supported Sitting balance-Leahy Scale: Fair Sitting balance - Comments: supervision for safety.   Standing balance support: Bilateral upper extremity supported;During functional activity Standing balance-Leahy Scale: Poor Standing balance comment: requires BUE support to maintain balance                            Cognition Arousal/Alertness: Awake/alert Behavior During Therapy: WFL for tasks assessed/performed Overall Cognitive Status: Within Functional Limits for tasks assessed Area of Impairment: Following commands;Problem solving                       Following Commands: Follows one step commands consistently;Follows one step commands with increased time     Problem Solving: Slow processing;Difficulty sequencing General Comments: Pt's family present during session      Exercises Total Joint Exercises Hip ABduction/ADduction: AROM;10 reps;Right;Left;Seated Long Arc Quad: AROM;10 reps;Right;Left;Seated Marching in Standing: AROM;10 reps;Right;Left;Seated    General Comments General comments (skin integrity, edema, etc.): Pt motivated throughout session      Pertinent Vitals/Pain Faces Pain Scale: Hurts little more Pain Location: RLE and back Pain Descriptors / Indicators: Constant;Aching;Burning Pain Intervention(s): Limited activity within patient's tolerance;Monitored during session    Home Living  Prior Function            PT Goals (current goals can now be found in the care plan section) Acute Rehab PT Goals Patient Stated Goal: to return home PT Goal Formulation: With patient Time For Goal Achievement: 04/13/18 Potential to Achieve Goals:  Good Progress towards PT goals: Progressing toward goals    Frequency    Min 3X/week      PT Plan Current plan remains appropriate    Co-evaluation              AM-PAC PT "6 Clicks" Mobility   Outcome Measure  Help needed turning from your back to your side while in a flat bed without using bedrails?: None Help needed moving from lying on your back to sitting on the side of a flat bed without using bedrails?: None Help needed moving to and from a bed to a chair (including a wheelchair)?: A Little Help needed standing up from a chair using your arms (e.g., wheelchair or bedside chair)?: A Lot Help needed to walk in hospital room?: A Little Help needed climbing 3-5 steps with a railing? : A Lot 6 Click Score: 18    End of Session Equipment Utilized During Treatment: Gait belt Activity Tolerance: Patient tolerated treatment well Patient left: in chair;with call bell/phone within reach Nurse Communication: Mobility status PT Visit Diagnosis: Unsteadiness on feet (R26.81);Other abnormalities of gait and mobility (R26.89);Muscle weakness (generalized) (M62.81)     Time: 1650-1700 PT Time Calculation (min) (ACUTE ONLY): 10 min  Charges:  $Gait Training: 8-22 mins                     [address], SPTA   [place] 04/03/2018, 5:17 PM

## 2018-04-04 DIAGNOSIS — I159 Secondary hypertension, unspecified: Secondary | ICD-10-CM

## 2018-04-04 DIAGNOSIS — N179 Acute kidney failure, unspecified: Secondary | ICD-10-CM

## 2018-04-04 LAB — BASIC METABOLIC PANEL
Anion gap: 12 (ref 5–15)
BUN: 51 mg/dL — ABNORMAL HIGH (ref 6–20)
CALCIUM: 9.1 mg/dL (ref 8.9–10.3)
CO2: 28 mmol/L (ref 22–32)
CREATININE: 6.02 mg/dL — AB (ref 0.44–1.00)
Chloride: 99 mmol/L (ref 98–111)
GFR calc Af Amer: 9 mL/min — ABNORMAL LOW (ref >60)
GFR calc non Af Amer: 7 mL/min — ABNORMAL LOW (ref >60)
Glucose, Bld: 100 mg/dL — ABNORMAL HIGH (ref 70–99)
Potassium: 4.5 mmol/L (ref 3.5–5.1)
Sodium: 139 mmol/L (ref 135–145)

## 2018-04-04 MED ORDER — AMLODIPINE BESYLATE 5 MG PO TABS: 5.0000 mg | ORAL_TABLET | Freq: Every day | ORAL | 0 refills | Status: DC

## 2018-04-04 MED ORDER — OXYCODONE HCL 5 MG PO TABS: 5.0000 mg | ORAL_TABLET | Freq: Three times a day (TID) | ORAL | 0 refills | Status: DC | PRN

## 2018-04-04 MED ORDER — OXYCODONE-ACETAMINOPHEN 10-325 MG PO TABS: 0.5000 | ORAL_TABLET | Freq: Four times a day (QID) | ORAL | 0 refills | Status: AC | PRN

## 2018-04-04 NOTE — Progress Notes (Signed)
Pharmacy Antibiotic Note  Jeanne HinesDeborah Leach is a 52 y.o. female admitted on 03/27/2018 , the ER work-up was suggestive of sepsis due to pyelonephritis along with possible narcotic and benzo overdose.  Pharmacy has been consulted for Meropenem dosing for  E.coli  ESBL pyelonephritis. D7 merrem today.  Plan: Meropenem 1 gm IV q24h to end today 12/4   Height: 5\' 5"  (165.1 cm) Weight: 245 lb 6 oz (111.3 kg) IBW/kg (Calculated) : 57  Temp (24hrs), Avg:99.2 F (37.3 C), Min:98.9 F (37.2 C), Max:99.6 F (37.6 C)  Recent Labs  Lab 03/29/18 0549  03/30/18 0432  03/31/18 0319 04/01/18 0444 04/02/18 0814 04/03/18 0322 04/04/18 0349  WBC 12.8*  --  10.9*  --  10.0  --   --   --   --   CREATININE 9.36*   < > 9.42*   < > 9.36* 8.92* 7.61* 6.91* 6.02*  VANCORANDOM 24  --   --   --   --   --   --   --   --    < > = values in this interval not displayed.    Estimated Creatinine Clearance: 13.7 mL/min (A) (by C-G formula based on SCr of 6.02 mg/dL (H)).    No Known Allergies  Antimicrobials this admission: 11/26 vanc>>11/27  (VR = 24 on 11/28 11/26 cefepime>>11/27 11/26 flagyl>>11/27 11/27: Doxy>>12/1 11/28: Meropenem>> (12/4)  Dose adjustments this admission:   Microbiology results: 1/26 urine>> E.coli  ESBL  11/26 blood>>ngtd  11/26 MRSA PCR>>negative 11/27 sputum:  mod gram variable rod, abundant yeast  Jeanne Leach A. Jeanne Leach, PharmD, BCPS Clinical Pharmacist [place] Pager: [phone] Please utilize Amion for appropriate phone number to reach the unit pharmacist [facility])   04/04/2018 8:21 AM

## 2018-04-04 NOTE — Progress Notes (Signed)
Physical Therapy Treatment Patient Details Name: Jeanne Leach MRN: [id number] DOB: 1966-01-24 Today's Date: 04/04/2018    History of Present Illness Pt is a 52 y.o. female with a PMH consisting of HTN, CKD, Bipolar, and chronic pain presents to the ED with weakness and AMS. Pt had spent the previous 5 days laying in bed due to the weakness. Pt found to have toxic & metabolic encephalopathy, and rhabdomyolysis.    PT Comments    Pt very focused on going home today after her IV antibiotic finishes. Pt only agreeable to LE therex, bed mobility and transfers. Pt stated that she has been ambulating in her room independently all night. Pt would continue to benefit from skilled physical therapy services at this time while admitted and after d/c to address the below listed limitations in order to improve overall safety and independence with functional mobility.    Follow Up Recommendations  Home health PT;Supervision/Assistance - 24 hour     Equipment Recommendations  Rolling walker with 5" wheels;Other (comment)(however, pt reported she has been ambulating in room without)    Recommendations for Other Services       Precautions / Restrictions Precautions Precautions: Fall Restrictions Weight Bearing Restrictions: No    Mobility  Bed Mobility Overal bed mobility: Needs Assistance Bed Mobility: Supine to Sit;Sit to Supine     Supine to sit: Supervision Sit to supine: Supervision   General bed mobility comments: supervision for safety  Transfers Overall transfer level: Needs assistance Equipment used: None Transfers: Sit to/from Stand Sit to Stand: Min guard         General transfer comment: min guard for safety without use of an AD; pt using bed rail with one UE support for stability  Ambulation/Gait             General Gait Details: pt only agreeable to marching in place and lateral steps at EOB bilaterally with min guard without an AD or UE supports; min guard for  safety   Stairs             Wheelchair Mobility    Modified Rankin (Stroke Patients Only)       Balance Overall balance assessment: Needs assistance Sitting-balance support: No upper extremity supported;Feet supported Sitting balance-Leahy Scale: Good     Standing balance support: During functional activity;No upper extremity supported Standing balance-Leahy Scale: Fair                              Cognition Arousal/Alertness: Awake/alert Behavior During Therapy: WFL for tasks assessed/performed Overall Cognitive Status: Within Functional Limits for tasks assessed                                        Exercises General Exercises - Lower Extremity Long Arc Quad: AROM;Both;10 reps;Seated Hip Flexion/Marching: Standing;AROM;Strengthening;Both;20 reps Toe Raises: AROM;Both;10 reps;Seated Heel Raises: AROM;Both;10 reps;Seated    General Comments        Pertinent Vitals/Pain Pain Assessment: No/denies pain    Home Living                      Prior Function            PT Goals (current goals can now be found in the care plan section) Acute Rehab PT Goals PT Goal Formulation: With patient Time For Goal Achievement: 04/13/18  Potential to Achieve Goals: Good Progress towards PT goals: Progressing toward goals    Frequency    Min 3X/week      PT Plan Current plan remains appropriate    Co-evaluation              AM-PAC PT "6 Clicks" Mobility   Outcome Measure  Help needed turning from your back to your side while in a flat bed without using bedrails?: None Help needed moving from lying on your back to sitting on the side of a flat bed without using bedrails?: None Help needed moving to and from a bed to a chair (including a wheelchair)?: A Little Help needed standing up from a chair using your arms (e.g., wheelchair or bedside chair)?: A Little Help needed to walk in hospital room?: A Little Help needed  climbing 3-5 steps with a railing? : A Lot 6 Click Score: 19    End of Session   Activity Tolerance: Patient tolerated treatment well Patient left: in bed;with call bell/phone within reach Nurse Communication: Mobility status PT Visit Diagnosis: Unsteadiness on feet (R26.81);Other abnormalities of gait and mobility (R26.89);Muscle weakness (generalized) (M62.81)     Time: [id number] PT Time Calculation (min) (ACUTE ONLY): 11 min  Charges:  $Therapeutic Activity: 8-22 mins                     Kiyla ChalkJennifer Prisila Dlouhy, PT, DPT  Acute Rehabilitation Services Pager [phone] Office [phone]     Alessandra BevelsJennifer M Allon Costlow 04/04/2018, 10:50 AM

## 2018-04-04 NOTE — Progress Notes (Signed)
  [facility] Progress Note    Assessment/ Plan:   1. AKI fromrhabdomyolysis and likely resultantATN; UOP improving--expect to continue autodiuresis and improving creatinine.   2. Rhabdomyolysis after being in bed x5d- stop IVFs  3. ESBL E. Coli UTI, ? Pyelonephritis, on meropenem, finished course 4. Metabolic Acidosis, inc AG, from #1, on oral bicarb 5. Anemia, stable, normocytic,no intervention required 6. Dispo: hopefully home tomorrow- would expect Cr to return to normal, if there is residual CKD as detected by PCP, can follow up at our office, otherwise none needed.  Would have labs checked within 1 week with PCP  Subjective:    For d/c today   Objective:   BP 129/87 (BP Location: Right Arm)   Pulse 80   Temp 99.1 F (37.3 C) (Oral)   Resp 17   Ht 5\' 5"  (1.651 m)   Wt 111.3 kg   SpO2 98%   BMI 40.83 kg/m   Intake/Output Summary (Last 24 hours) at 04/04/2018 1322 Last data filed at 04/03/2018 2048 Gross per 24 hour  Intake 220 ml  Output 0 ml  Net 220 ml   Weight change: 2.3 kg  Physical Exam: Gen: NAD CVS: RRR Resp: clear bilaterally no c/w/r Abd: soft nontender NABS Ext: no LE edema  Imaging: No results found.  Labs: BMET Recent Labs  Lab 03/29/18 0549  03/30/18 0432 03/30/18 1415 03/31/18 0319 04/01/18 0444 04/02/18 0814 04/03/18 0322 04/04/18 0349  NA 137   < > 132* 134* 136 137 137 138 139  K 3.6   < > 3.5 3.8 3.7 3.7 3.5 3.8 4.5  CL 104   < > 103 102 101 101 102 99 99  CO2 18*   < > 19* 19* 21* 19* 22 27 28   GLUCOSE 105*   < > 94 125* 94 92 114* 97 100*  BUN 63*   < > 64* 65* 64* 63* 60* 53* 51*  CREATININE 9.36*   < > 9.42* 9.37* 9.36* 8.92* 7.61* 6.91* 6.02*  CALCIUM 7.9*   < > 8.0* 8.4* 8.3* 8.4* 8.8* 8.8* 9.1  PHOS 6.3*  --   --   --   --   --   --   --   --    < > = values in this interval not displayed.   CBC Recent Labs  Lab 03/29/18 0549 03/30/18 0432 03/31/18 0319  WBC 12.8* 10.9* 10.0  NEUTROABS 9.6* 7.5  6.0  HGB 8.9* 8.8* 8.6*  HCT 27.1* 26.7* 26.7*  MCV 96.1 95.0 97.4  PLT 215 186 248    Medications:    . acetaminophen  650 mg Oral Once  . heparin injection (subcutaneous)  5,000 Units Subcutaneous Q8H  . mouth rinse  15 mL Mouth Rinse BID  . traMADol  100 mg Oral Q12H      Bufford ButtnerElizabeth Melvin Whiteford, MD 04/04/2018, 1:22 PM

## 2018-04-04 NOTE — Progress Notes (Signed)
Pt given discharge instructions, prescriptions, and care notes. Pt verbalized understanding AEB no further questions or concerns at this time. IV was discontinued, no redness, pain, or swelling noted at this time. Skin was dry and intact. Pt left the floor via wheelchair with staff in stable condition.

## 2018-04-04 NOTE — Discharge Summary (Addendum)
PATIENT DETAILS Name: Jeanne HinesDeborah Leach Age: 52 y.o. Sex: female Date of Birth: [birth year] MRN: [id number]. Admitting Physician: Jeanne ClosArshad N Kakrakandy, MD WUJ:[id number]:[name], Jeanne Basemanwight M, MD  Admit Date: 03/27/2018 Discharge date: 04/04/2018  Recommendations for Outpatient Follow-up:  1. Follow up with PCP in 1-2 weeks 2. Please obtain BMP/CBC in one week 3. PCP to slowly taper down benzodiazepines and taper stress minimize narcotics as much as possible narcotics  Admitted From:  Home  Disposition: Home   Home Health: Home health services  Equipment/Devices: None  Discharge Condition: Stable  CODE STATUS: FULL CODE  Diet recommendation:  Heart Healthy   Brief Summary: See H&P, Labs, Consult and Test reports for all details in brief, 52 year old female with history of chronic pain syndrome (chronic back pain) on narcotics, anxiety on Xanax, anemia, CKD stage III presented to the hospital with this, hypotensive, encephalopathic-she was found to have sepsis secondary to ESBL pyelonephritis, acute kidney injury, rhabdomyolysis.  She was subsequently admitted to the hospitalist service for further evaluation and treatment.  Brief Hospital Course: Acute combined metabolic and toxic encephalopathy: Encephalopathy was felt to be multifactorial-due to combination of acute kidney injury, sepsis due to pyelonephritis and ongoing use of narcotics and benzodiazepine (on chronic narcotics and Xanax).  She was managed with supportive care-following treatment of underlying sepsis, improvement in kidney failure-and holding Percocet and Xanax-her mental status improved.  She is now back to baseline.  Long discussion with patient-since chronic use narcotics/Xanax may have contributed to her encephalopathy-I have recommended that she change her Xanax (decrease dose to 0.5 tab) to as needed dosing (appears to be on both scheduled and as needed dosing as outpatient), and have also recommended that she  decrease Percocet to half a tablet (previously on 10/325).  [facility] [place] [facility].  Patient unfortunately has been on these agents for a prolonged period of time-and needs to be slowly tapered off these agents to prevent withdrawal symptoms.  I have spoken with the patient's spouse over the phone-have explained to both him and the patient dangers of polypharmacy-including a life-threatening and life disabling effects.  I have asked the patient spouse to monitor use of such medications-and to count pills to make sure patient does not take extra.  I have asked both the patient and her spouse to follow-up with their primary care practitioner who is prescribing these medications so that they can be slowly tapered off these medications.  Note-this MD has not been given prescriptions for benzodiazepines or narcotics to this patient-it appears that this patient may have a pain contract with her PCP.  Sepsis secondary to ESBL E. coli pyelonephritis: Sepsis pathophysiology has resolved and patient to finish off last dose of meropenem today.  Does not require any further antimicrobial therapy on discharge.  Acute hypoxic and hypercapnic respiratory failure: Likely multifactorial-secondary to encephalopathy, use of narcotics/benzodiazepines-initially required BiPAP.  Not felt to have pneumonia clinically.  Rapidly improved now on room air.  Acute kidney injury on CKD stage III: From rhabdomyolysis and sepsis-thought to have ATN-but thankfully renal function has improved.  Spoke with Dr. Lytle ButteUpton-nephrology on-call-okay to discharge-patient's creatinine seems to be improving rapidly-recommendations from nephrology are to recheck electrolytes in 1 week at PCPs office-and if patient's creatinine is not yet back to baseline then to seek nephrology referral.  This was explained to patient and her spouse over the phone-they are agreeable.  Rhabdomyolysis: Significantly improved-thought to be secondary  to being in bed for almost 5 days.  Severe metabolic  acidosis: Secondary to sepsis and renal failure-resolved.  Anemia of chronic disease: Stable-continue to follow CBC periodically in the outpatient setting.  Elevated transaminases: Secondary to rhabdomyolysis and hypotension-has resolved.  Elevated d-dimer: No indication of VTE- VQ scan was of low probability.  Hypertension-we will stop losartan by due to CKD) and use amlodipine as blood pressure is creeping up.  Polypharmacy/chronic pain syndrome: See above regarding changes to Xanax and Percocet-Long discussion with patient-both patient and spouse were counseled regarding dangers of polypharmacy including life-threatening and life disabling effects.  Procedures/Studies: None  Discharge Diagnoses:  Principal Problem:   Severe sepsis (HCC) Active Problems:   Hypertension   Bipolar 1 disorder (HCC)   AKI (acute kidney injury) (HCC)   Opioid dependence (HCC)   Acute respiratory failure with hypoxia (HCC)   Normocytic normochromic anemia   Pyelonephritis   Sepsis (HCC)   Discharge Instructions:  Activity:  As tolerated   Discharge Instructions    Diet - low sodium heart healthy   Complete by:  As directed    Discharge instructions   Complete by:  As directed    Follow with Primary MD  Jearld Lesch, MD in 1 week to check your kidney function, if your kidney function is still abnormal-please ask your primary care practitioner to refer you to a nephrologist.  Please get a complete blood count and chemistry panel checked by your Primary MD at your next visit, and again as instructed by your Primary MD.  Get Medicines reviewed and adjusted: Please take all your medications with you for your next visit with your Primary MD  Laboratory/radiological data: Please request your Primary MD to go over all hospital tests and procedure/radiological results at the follow up, please ask your Primary MD to get all Hospital records  sent to his/her office.  In some cases, they will be blood work, cultures and biopsy results pending at the time of your discharge. Please request that your primary care Leach.D. follows up on these results.  Also Note the following: If you experience worsening of your admission symptoms, develop shortness of breath, life threatening emergency, suicidal or homicidal thoughts you must seek medical attention immediately by calling 911 or calling your MD immediately  if symptoms less severe.  You must read complete instructions/literature along with all the possible adverse reactions/side effects for all the Medicines you take and that have been prescribed to you. Take any new Medicines after you have completely understood and accpet all the possible adverse reactions/side effects.   Do not drive when taking Pain medications or sleeping medications (Benzodaizepines)  Do not take more than prescribed Pain, Sleep and Anxiety Medications. It is not advisable to combine anxiety,sleep and pain medications without talking with your primary care practitioner  Special Instructions: If you have smoked or chewed Tobacco  in the last 2 yrs please stop smoking, stop any regular Alcohol  and or any Recreational drug use.  Wear Seat belts while driving.  Please note: You were cared for by a hospitalist during your hospital stay. Once you are discharged, your primary care physician will handle any further medical issues. Please note that NO REFILLS for any discharge medications will be authorized once you are discharged, as it is imperative that you return to your primary care physician (or establish a relationship with a primary care physician if you do not have one) for your post hospital discharge needs so that they can reassess your need for medications and monitor your lab values.  Increase activity slowly   Complete by:  As directed      Allergies as of 04/04/2018   No Known Allergies     Medication List      STOP taking these medications   Oxycodone HCl 20 MG Tabs   sulfamethoxazole-trimethoprim 800-160 MG tablet Commonly known as:  BACTRIM DS,SEPTRA DS   valsartan 80 MG tablet Commonly known as:  DIOVAN     TAKE these medications   alprazolam 2 MG tablet Commonly known as:  XANAX Take 0.5 tablets (1 mg total) by mouth 3 (three) times daily as needed for sleep or anxiety. What changed:  Another medication with the same name was removed. Continue taking this medication, and follow the directions you see here.   amLODipine 5 MG tablet Commonly known as:  NORVASC Take 1 tablet (5 mg total) by mouth daily.   oxyCODONE-acetaminophen 10-325 MG tablet Commonly known as:  PERCOCET Take 0.5 tablets by mouth every 6 (six) hours as needed for pain. What changed:  how much to take      Follow-up Information    Jearld Lesch, MD Follow up.   Specialty:  Family Medicine Why:  keep existing appointment on Friday Contact information: [address] [place] [address] [phone]          No Known Allergies  Consultations:   nephrology  Other Procedures/Studies: Ct Head Wo Contrast  Result Date: 03/28/2018 CLINICAL DATA:  Altered mental status EXAM: CT HEAD WITHOUT CONTRAST TECHNIQUE: Contiguous axial images were obtained from the base of the skull through the vertex without intravenous contrast. COMPARISON:  Head CT 10/04/2015 FINDINGS: Brain: There is no mass, hemorrhage or extra-axial collection. The size and configuration of the ventricles and extra-axial CSF spaces are normal. The brain parenchyma is normal, without evidence of acute or chronic infarction. Vascular: No abnormal hyperdensity of the major intracranial arteries or dural venous sinuses. No intracranial atherosclerosis. Skull: The visualized skull base, calvarium and extracranial soft tissues are normal. Sinuses/Orbits: No fluid levels or advanced mucosal thickening of the visualized paranasal sinuses.  No mastoid or middle ear effusion. The orbits are normal. IMPRESSION: Normal head CT. Electronically Signed   By: Deatra Robinson Leach.D.   On: 03/28/2018 06:01   Nm Pulmonary Perfusion  Result Date: 03/28/2018 CLINICAL DATA:  Acute respiratory failure with hypoxia. Droplet precautions for influenza. EXAM: NUCLEAR MEDICINE PERFUSION LUNG SCAN TECHNIQUE: Perfusion images were obtained in multiple projections after intravenous injection of radiopharmaceutical. RADIOPHARMACEUTICALS:  4 millicurie mCi Tc-39m MAA IV COMPARISON:  03/27/2018 FINDINGS: The perfusion lung scan appears normal. No perfusion defect is identified to suggest pulmonary embolus. IMPRESSION: 1. Normal-appearing perfusion lung scan. No perfusion defect to suggest pulmonary embolus. 2. Because the perfusion images were normal, and because the patient is on droplet precautions, I felt that ventilation images would be problematic to obtain and not helpful for diagnosis, and accordingly today's exam is performed as a perfusion lung scan only rather than a ventilation-perfusion scan. Electronically Signed   By: Gaylyn Rong Leach.D.   On: 03/28/2018 13:09   US Renal  Result Date: 03/28/2018 CLINICAL DATA:  53 y/o  F; acute renal failure. EXAM: RENAL / URINARY TRACT ULTRASOUND COMPLETE COMPARISON:  03/27/2018 CT abdomen and pelvis. FINDINGS: Right Kidney: Renal measurements: 11.4 x 6.3 x 5.9 cm = volume: 222 mL . Echogenicity within normal limits. No mass or hydronephrosis visualized. Prominent interpolar kidney Bertin column. Left Kidney: Renal measurements: 12.1 x 6.1 x 4.6 cm = volume:  293 mL. Echogenicity within normal limits. No mass or hydronephrosis visualized. Bladder: Appears normal for degree of bladder distention. Foley catheter in situ. IMPRESSION: Normal renal ultrasound. Electronically Signed   By: Mitzi Hansen Leach.D.   On: 03/28/2018 16:00   Dg Chest Port 1 View  Result Date: 03/29/2018 CLINICAL DATA:  New onset  persistent cough this morning. Acute kidney injury. Acute respiratory failure with hypoxia. EXAM: PORTABLE CHEST 1 VIEW COMPARISON:  Radiographs 11/03/2014 and 03/27/2018. Chest CT 10/04/2015. Abdominal CT 03/27/2018. FINDINGS: 0749 hours. The heart size and mediastinal contours are normal. There are low lung volumes with mild interstitial prominence in both lung bases, similar to recent prior study. The pulmonary vascularity is normal. There is no confluent airspace opacity, pleural effusion or pneumothorax. The bones appear unchanged. IMPRESSION: Interstitial prominence at both lung bases, similar to recent prior study and likely due to a combination of atelectasis and peribronchial inflammation as correlated with recent abdominal CT. No consolidation or significant pleural effusion. Electronically Signed   By: Carey Bullocks Leach.D.   On: 03/29/2018 08:18   Dg Chest Portable 1 View  Result Date: 03/27/2018 CLINICAL DATA:  Severe cough hypoxia EXAM: PORTABLE CHEST 1 VIEW COMPARISON:  CT 10/04/2015, radiograph 11/03/2014 FINDINGS: No focal consolidation or pleural effusion. Stable cardiomediastinal silhouette. No pneumothorax. IMPRESSION: No active disease. Electronically Signed   By: Jasmine Pang Leach.D.   On: 03/27/2018 17:54   Ct Renal Stone Study  Result Date: 03/27/2018 CLINICAL DATA:  Initial evaluation for acute bilateral flank pain. EXAM: CT ABDOMEN AND PELVIS WITHOUT CONTRAST TECHNIQUE: Multidetector CT imaging of the abdomen and pelvis was performed following the standard protocol without IV contrast. COMPARISON:  Prior CT from 10/04/2015. FINDINGS: Lower chest: Dependent atelectatic changes noted within the visualized lung bases. Few additional scattered tree-in-bud nodular densities noted, nonspecific, but suggestive of possible endobronchial infection/small airways disease. Hepatobiliary: Liver demonstrates a normal unenhanced appearance. Gallbladder within normal limits. No biliary dilatation.  Pancreas: Mild diffuse fatty infiltration the pancreas noted. Pancreas otherwise unremarkable without acute inflammatory changes. Spleen: Spleen within normal limits. Adrenals/Urinary Tract: Adrenal glands are normal. Kidneys equal in size. No nephrolithiasis or hydronephrosis. No radiopaque calculi seen along the course of either renal collecting system. No hydroureter. Subtle hazy perinephric fat stranding seen about the kidneys bilaterally, right slightly greater than left, raising the possibility for possible upper urinary tract infection. No perinephric collections seen on this noncontrast examination. No layering stones within the bladder lumen. Mild circumferential wall thickening about the bladder. Stomach/Bowel: Stomach within normal limits. No evidence for bowel obstruction. Normal appendix. No acute inflammatory changes seen about the bowels. Vascular/Lymphatic: Mild aorto bi-iliac atherosclerotic disease. No aneurysm. No adenopathy. Reproductive: Uterus and ovaries within normal limits. Other: No free air or fluid. Small fat containing paraumbilical hernia noted. Musculoskeletal: No acute osseous abnormality. No worrisome lytic or blastic osseous lesions. Mild multilevel facet arthropathy noted within the lumbar spine. IMPRESSION: 1. No CT evidence for nephrolithiasis or obstructive uropathy. 2. Mild hazy perinephric fat stranding about the kidneys bilaterally, right greater than left. Findings are nonspecific, but could reflect sequelae of acute upper urinary tract infection/pyelonephritis. Correlation with urinalysis recommended. Mild circumferential bladder wall thickening may be related to incomplete distension and/or concomitant cystitis. 3. Patchy multifocal nodular tree-in-bud opacities within the visualized lung bases, suspicious for acute endobronchial infection/small airways disease. 4. No other acute abnormality within the abdomen and pelvis. Electronically Signed   By: Rise Mu  Leach.D.   On: 03/27/2018 20:09  TODAY-DAY OF DISCHARGE:  Subjective:   Jeanne Leach today has no headache,no chest abdominal pain,no new weakness tingling or numbness, feels much better wants to go home today.  Objective:   Blood pressure 129/87, pulse 80, temperature 99.1 F (37.3 C), temperature source Oral, resp. rate 17, height 5\' 5"  (1.651 Leach), weight 111.3 kg, SpO2 98 %.  Intake/Output Summary (Last 24 hours) at 04/04/2018 1103 Last data filed at 04/03/2018 2048 Gross per 24 hour  Intake 570 ml  Output 0 ml  Net 570 ml   Filed Weights   04/02/18 0530 04/03/18 0518 04/04/18 0500  Weight: 111.2 kg 109 kg 111.3 kg    Exam: Awake Alert, Oriented *3, No new F.N deficits, Normal affect [place].AT,PERRAL Supple Neck,No JVD, No cervical lymphadenopathy appriciated.  Symmetrical Chest wall movement, Good air movement bilaterally, CTAB RRR,No Gallops,Rubs or new Murmurs, No Parasternal Heave +ve B.Sounds, Abd Soft, Non tender, No organomegaly appriciated, No rebound -guarding or rigidity. No Cyanosis, Clubbing or edema, No new Rash or bruise   PERTINENT RADIOLOGIC STUDIES: Ct Head Wo Contrast  Result Date: 03/28/2018 CLINICAL DATA:  Altered mental status EXAM: CT HEAD WITHOUT CONTRAST TECHNIQUE: Contiguous axial images were obtained from the base of the skull through the vertex without intravenous contrast. COMPARISON:  Head CT 10/04/2015 FINDINGS: Brain: There is no mass, hemorrhage or extra-axial collection. The size and configuration of the ventricles and extra-axial CSF spaces are normal. The brain parenchyma is normal, without evidence of acute or chronic infarction. Vascular: No abnormal hyperdensity of the major intracranial arteries or dural venous sinuses. No intracranial atherosclerosis. Skull: The visualized skull base, calvarium and extracranial soft tissues are normal. Sinuses/Orbits: No fluid levels or advanced mucosal thickening of the visualized paranasal sinuses. No  mastoid or middle ear effusion. The orbits are normal. IMPRESSION: Normal head CT. Electronically Signed   By: Deatra Robinson Leach.D.   On: 03/28/2018 06:01   Nm Pulmonary Perfusion  Result Date: 03/28/2018 CLINICAL DATA:  Acute respiratory failure with hypoxia. Droplet precautions for influenza. EXAM: NUCLEAR MEDICINE PERFUSION LUNG SCAN TECHNIQUE: Perfusion images were obtained in multiple projections after intravenous injection of radiopharmaceutical. RADIOPHARMACEUTICALS:  4 millicurie mCi Tc-62m MAA IV COMPARISON:  03/27/2018 FINDINGS: The perfusion lung scan appears normal. No perfusion defect is identified to suggest pulmonary embolus. IMPRESSION: 1. Normal-appearing perfusion lung scan. No perfusion defect to suggest pulmonary embolus. 2. Because the perfusion images were normal, and because the patient is on droplet precautions, I felt that ventilation images would be problematic to obtain and not helpful for diagnosis, and accordingly today's exam is performed as a perfusion lung scan only rather than a ventilation-perfusion scan. Electronically Signed   By: Gaylyn Rong Leach.D.   On: 03/28/2018 13:09   US Renal  Result Date: 03/28/2018 CLINICAL DATA:  52 y/o  F; acute renal failure. EXAM: RENAL / URINARY TRACT ULTRASOUND COMPLETE COMPARISON:  03/27/2018 CT abdomen and pelvis. FINDINGS: Right Kidney: Renal measurements: 11.4 x 6.3 x 5.9 cm = volume: 222 mL . Echogenicity within normal limits. No mass or hydronephrosis visualized. Prominent interpolar kidney Bertin column. Left Kidney: Renal measurements: 12.1 x 6.1 x 4.6 cm = volume: 293 mL. Echogenicity within normal limits. No mass or hydronephrosis visualized. Bladder: Appears normal for degree of bladder distention. Foley catheter in situ. IMPRESSION: Normal renal ultrasound. Electronically Signed   By: Mitzi Hansen Leach.D.   On: 03/28/2018 16:00   Dg Chest Port 1 View  Result Date: 03/29/2018 CLINICAL DATA:  New onset persistent  cough this morning. Acute kidney injury. Acute respiratory failure with hypoxia. EXAM: PORTABLE CHEST 1 VIEW COMPARISON:  Radiographs 11/03/2014 and 03/27/2018. Chest CT 10/04/2015. Abdominal CT 03/27/2018. FINDINGS: 0749 hours. The heart size and mediastinal contours are normal. There are low lung volumes with mild interstitial prominence in both lung bases, similar to recent prior study. The pulmonary vascularity is normal. There is no confluent airspace opacity, pleural effusion or pneumothorax. The bones appear unchanged. IMPRESSION: Interstitial prominence at both lung bases, similar to recent prior study and likely due to a combination of atelectasis and peribronchial inflammation as correlated with recent abdominal CT. No consolidation or significant pleural effusion. Electronically Signed   By: Carey Bullocks Leach.D.   On: 03/29/2018 08:18   Dg Chest Portable 1 View  Result Date: 03/27/2018 CLINICAL DATA:  Severe cough hypoxia EXAM: PORTABLE CHEST 1 VIEW COMPARISON:  CT 10/04/2015, radiograph 11/03/2014 FINDINGS: No focal consolidation or pleural effusion. Stable cardiomediastinal silhouette. No pneumothorax. IMPRESSION: No active disease. Electronically Signed   By: Jasmine Pang Leach.D.   On: 03/27/2018 17:54   Ct Renal Stone Study  Result Date: 03/27/2018 CLINICAL DATA:  Initial evaluation for acute bilateral flank pain. EXAM: CT ABDOMEN AND PELVIS WITHOUT CONTRAST TECHNIQUE: Multidetector CT imaging of the abdomen and pelvis was performed following the standard protocol without IV contrast. COMPARISON:  Prior CT from 10/04/2015. FINDINGS: Lower chest: Dependent atelectatic changes noted within the visualized lung bases. Few additional scattered tree-in-bud nodular densities noted, nonspecific, but suggestive of possible endobronchial infection/small airways disease. Hepatobiliary: Liver demonstrates a normal unenhanced appearance. Gallbladder within normal limits. No biliary dilatation. Pancreas:  Mild diffuse fatty infiltration the pancreas noted. Pancreas otherwise unremarkable without acute inflammatory changes. Spleen: Spleen within normal limits. Adrenals/Urinary Tract: Adrenal glands are normal. Kidneys equal in size. No nephrolithiasis or hydronephrosis. No radiopaque calculi seen along the course of either renal collecting system. No hydroureter. Subtle hazy perinephric fat stranding seen about the kidneys bilaterally, right slightly greater than left, raising the possibility for possible upper urinary tract infection. No perinephric collections seen on this noncontrast examination. No layering stones within the bladder lumen. Mild circumferential wall thickening about the bladder. Stomach/Bowel: Stomach within normal limits. No evidence for bowel obstruction. Normal appendix. No acute inflammatory changes seen about the bowels. Vascular/Lymphatic: Mild aorto bi-iliac atherosclerotic disease. No aneurysm. No adenopathy. Reproductive: Uterus and ovaries within normal limits. Other: No free air or fluid. Small fat containing paraumbilical hernia noted. Musculoskeletal: No acute osseous abnormality. No worrisome lytic or blastic osseous lesions. Mild multilevel facet arthropathy noted within the lumbar spine. IMPRESSION: 1. No CT evidence for nephrolithiasis or obstructive uropathy. 2. Mild hazy perinephric fat stranding about the kidneys bilaterally, right greater than left. Findings are nonspecific, but could reflect sequelae of acute upper urinary tract infection/pyelonephritis. Correlation with urinalysis recommended. Mild circumferential bladder wall thickening may be related to incomplete distension and/or concomitant cystitis. 3. Patchy multifocal nodular tree-in-bud opacities within the visualized lung bases, suspicious for acute endobronchial infection/small airways disease. 4. No other acute abnormality within the abdomen and pelvis. Electronically Signed   By: Rise Mu Leach.D.   On:  03/27/2018 20:09     PERTINENT LAB RESULTS: CBC: No results for input(s): WBC, HGB, HCT, PLT in the last 72 hours. CMET CMP     Component Value Date/Time   NA 139 04/04/2018 0349   NA 131 (L) 05/21/2013 1205   K 4.5 04/04/2018 0349   K 3.6 05/21/2013 1205   CL 99 04/04/2018 0349  CL 96 (L) 05/21/2013 1205   CO2 28 04/04/2018 0349   CO2 31 05/21/2013 1205   GLUCOSE 100 (H) 04/04/2018 0349   GLUCOSE 163 (H) 05/21/2013 1205   BUN 51 (H) 04/04/2018 0349   BUN 11 05/21/2013 1205   CREATININE 6.02 (H) 04/04/2018 0349   CREATININE 0.94 05/21/2013 1205   CALCIUM 9.1 04/04/2018 0349   CALCIUM 9.6 05/21/2013 1205   PROT 5.4 (L) 04/01/2018 0444   PROT 9.3 (H) 05/21/2013 1205   ALBUMIN 2.1 (L) 04/01/2018 0444   ALBUMIN 4.0 05/21/2013 1205   AST 101 (H) 04/01/2018 0444   AST 20 05/21/2013 1205   ALT 91 (H) 04/01/2018 0444   ALT 18 05/21/2013 1205   ALKPHOS 36 (L) 04/01/2018 0444   ALKPHOS 62 05/21/2013 1205   BILITOT 0.6 04/01/2018 0444   BILITOT 0.3 05/21/2013 1205   GFRNONAA 7 (L) 04/04/2018 0349   GFRNONAA >60 05/21/2013 1205   GFRAA 9 (L) 04/04/2018 0349   GFRAA >60 05/21/2013 1205    GFR Estimated Creatinine Clearance: 13.7 mL/min (A) (by C-G formula based on SCr of 6.02 mg/dL (H)). No results for input(s): LIPASE, AMYLASE in the last 72 hours. No results for input(s): CKTOTAL, CKMB, CKMBINDEX, TROPONINI in the last 72 hours. Invalid input(s): POCBNP No results for input(s): DDIMER in the last 72 hours. No results for input(s): HGBA1C in the last 72 hours. No results for input(s): CHOL, HDL, LDLCALC, TRIG, CHOLHDL, LDLDIRECT in the last 72 hours. No results for input(s): TSH, T4TOTAL, T3FREE, THYROIDAB in the last 72 hours.  Invalid input(s): FREET3 No results for input(s): VITAMINB12, FOLATE, FERRITIN, TIBC, IRON, RETICCTPCT in the last 72 hours. Coags: No results for input(s): INR in the last 72 hours.  Invalid input(s): PT Microbiology: Recent Results (from  the past 240 hour(s))  Blood culture (routine x 2)     Status: None   Collection Time: 03/27/18  5:05 PM  Result Value Ref Range Status   Specimen Description SITE NOT SPECIFIED  Final   Special Requests   Final    BOTTLES DRAWN AEROBIC AND ANAEROBIC Blood Culture adequate volume   Culture   Final    NO GROWTH 5 DAYS Performed at [facility], [address] [place], [place] [address]    Report Status 04/01/2018 FINAL  Final  Urine culture     Status: Abnormal   Collection Time: 03/27/18  5:12 PM  Result Value Ref Range Status   Specimen Description URINE, RANDOM  Final   Special Requests   Final    NONE Performed at [facility], [address], [place], [place] [address]    Culture (A)  Final    >=100,000 COLONIES/mL ESCHERICHIA COLI Confirmed Extended Spectrum Beta-Lactamase Producer (ESBL).  In bloodstream infections from ESBL organisms, carbapenems are preferred over piperacillin/tazobactam. They are shown to have a lower risk of mortality.    Report Status 03/29/2018 FINAL  Final   Organism ID, Bacteria ESCHERICHIA COLI (A)  Final      Susceptibility   Escherichia coli - MIC*    AMPICILLIN >=32 RESISTANT Resistant     CEFAZOLIN >=64 RESISTANT Resistant     CEFTRIAXONE >=64 RESISTANT Resistant     CIPROFLOXACIN >=4 RESISTANT Resistant     GENTAMICIN >=16 RESISTANT Resistant     IMIPENEM <=0.25 SENSITIVE Sensitive     NITROFURANTOIN <=16 SENSITIVE Sensitive     TRIMETH/SULFA <=20 SENSITIVE Sensitive     AMPICILLIN/SULBACTAM >=32 RESISTANT Resistant  PIP/TAZO <=4 SENSITIVE Sensitive     Extended ESBL POSITIVE Resistant     * >=100,000 COLONIES/mL ESCHERICHIA COLI  Blood culture (routine x 2)     Status: None   Collection Time: 03/27/18  5:15 PM  Result Value Ref Range Status   Specimen Description SITE NOT SPECIFIED  Final   Special Requests   Final    BOTTLES DRAWN AEROBIC AND ANAEROBIC Blood Culture results may not be optimal due to an inadequate  volume of blood received in culture bottles   Culture   Final    NO GROWTH 5 DAYS Performed at [facility], [address], [place], [place] [address]    Report Status 04/01/2018 FINAL  Final  MRSA PCR Screening     Status: None   Collection Time: 03/27/18  9:53 PM  Result Value Ref Range Status   MRSA by PCR NEGATIVE NEGATIVE Final    Comment:        The GeneXpert MRSA Assay (FDA approved for NASAL specimens only), is one component of a comprehensive MRSA colonization surveillance program. It is not intended to diagnose MRSA infection nor to guide or monitor treatment for MRSA infections. Performed at [facility], [address] [place], [place] [address]   Culture, sputum-assessment     Status: None   Collection Time: 03/28/18  7:43 PM  Result Value Ref Range Status   Specimen Description EXPECTORATED SPUTUM  Final   Special Requests NONE  Final   Sputum evaluation   Final    THIS SPECIMEN IS ACCEPTABLE FOR SPUTUM CULTURE Performed at [facility], [address], [place], [place] [address]    Report Status 03/29/2018 FINAL  Final  Culture, respiratory     Status: None   Collection Time: 03/28/18  7:43 PM  Result Value Ref Range Status   Specimen Description EXPECTORATED SPUTUM  Final   Special Requests NONE Reflexed from [id number]  Final   Gram Stain   Final    ABUNDANT WBC PRESENT,BOTH PMN AND MONONUCLEAR MODERATE GRAM VARIABLE ROD ABUNDANT YEAST MODERATE SQUAMOUS EPITHELIAL CELLS PRESENT Performed at [facility], [address] [place], [place] [address]    Culture FEW CANDIDA ALBICANS  Final   Report Status 04/01/2018 FINAL  Final    FURTHER DISCHARGE INSTRUCTIONS:  Get Medicines reviewed and adjusted: Please take all your medications with you for your next visit with your Primary MD  Laboratory/radiological data: Please request your Primary MD to go over all hospital tests and procedure/radiological results at the follow up,  please ask your Primary MD to get all Hospital records sent to his/her office.  In some cases, they will be blood work, cultures and biopsy results pending at the time of your discharge. Please request that your primary care Leach.D. goes through all the records of your hospital data and follows up on these results.  Also Note the following: If you experience worsening of your admission symptoms, develop shortness of breath, life threatening emergency, suicidal or homicidal thoughts you must seek medical attention immediately by calling 911 or calling your MD immediately  if symptoms less severe.  You must read complete instructions/literature along with all the possible adverse reactions/side effects for all the Medicines you take and that have been prescribed to you. Take any new Medicines after you have completely understood and accpet all the possible adverse reactions/side effects.   Do not drive when taking Pain medications or sleeping medications (Benzodaizepines)  Do not take more than prescribed Pain, Sleep and Anxiety Medications. It is not advisable to combine anxiety,sleep and pain medications without talking with your primary care practitioner  Special Instructions: If you have smoked or chewed Tobacco  in the last 2 yrs please stop smoking, stop any regular Alcohol  and or any Recreational drug use.  Wear Seat belts while driving.  Please note: You were cared for by a hospitalist during your hospital stay. Once you are discharged, your primary care physician will handle any further medical issues. Please note that NO REFILLS for any discharge medications will be authorized once you are discharged, as it is imperative that you return to your primary care physician (or establish a relationship with a primary care physician if you do not have one) for your post hospital discharge needs so that they can reassess your need for medications and monitor your lab values.  Total Time spent  coordinating discharge including counseling, education and face to face time equals 35 minutes.  Signed:   04/04/2018 11:03 AM

## 2018-07-16 DIAGNOSIS — J101 Influenza due to other identified influenza virus with other respiratory manifestations: Secondary | ICD-10-CM | POA: Diagnosis not present

## 2018-07-16 DIAGNOSIS — H6693 Otitis media, unspecified, bilateral: Secondary | ICD-10-CM | POA: Diagnosis not present

## 2018-07-16 DIAGNOSIS — E039 Hypothyroidism, unspecified: Secondary | ICD-10-CM | POA: Diagnosis not present

## 2018-07-16 DIAGNOSIS — I10 Essential (primary) hypertension: Secondary | ICD-10-CM | POA: Diagnosis not present

## 2018-07-16 DIAGNOSIS — F419 Anxiety disorder, unspecified: Secondary | ICD-10-CM | POA: Diagnosis not present

## 2018-07-16 DIAGNOSIS — R509 Fever, unspecified: Secondary | ICD-10-CM | POA: Diagnosis not present

## 2018-07-16 DIAGNOSIS — J0140 Acute pansinusitis, unspecified: Secondary | ICD-10-CM | POA: Diagnosis not present

## 2018-08-02 DIAGNOSIS — M519 Unspecified thoracic, thoracolumbar and lumbosacral intervertebral disc disorder: Secondary | ICD-10-CM | POA: Diagnosis not present

## 2018-08-02 DIAGNOSIS — J302 Other seasonal allergic rhinitis: Secondary | ICD-10-CM | POA: Diagnosis not present

## 2018-08-02 DIAGNOSIS — G894 Chronic pain syndrome: Secondary | ICD-10-CM | POA: Diagnosis not present

## 2018-08-02 DIAGNOSIS — F329 Major depressive disorder, single episode, unspecified: Secondary | ICD-10-CM | POA: Diagnosis not present

## 2018-08-15 DIAGNOSIS — F329 Major depressive disorder, single episode, unspecified: Secondary | ICD-10-CM | POA: Diagnosis not present

## 2018-08-15 DIAGNOSIS — Z1389 Encounter for screening for other disorder: Secondary | ICD-10-CM | POA: Diagnosis not present

## 2018-08-15 DIAGNOSIS — F419 Anxiety disorder, unspecified: Secondary | ICD-10-CM | POA: Diagnosis not present

## 2018-09-02 DIAGNOSIS — I10 Essential (primary) hypertension: Secondary | ICD-10-CM | POA: Diagnosis not present

## 2018-09-02 DIAGNOSIS — T424X1A Poisoning by benzodiazepines, accidental (unintentional), initial encounter: Secondary | ICD-10-CM | POA: Diagnosis not present

## 2018-09-02 DIAGNOSIS — J9601 Acute respiratory failure with hypoxia: Secondary | ICD-10-CM | POA: Diagnosis not present

## 2018-09-02 DIAGNOSIS — Z9911 Dependence on respirator [ventilator] status: Secondary | ICD-10-CM | POA: Diagnosis not present

## 2018-09-02 DIAGNOSIS — R6521 Severe sepsis with septic shock: Secondary | ICD-10-CM | POA: Diagnosis not present

## 2018-09-02 DIAGNOSIS — J9811 Atelectasis: Secondary | ICD-10-CM | POA: Diagnosis not present

## 2018-09-02 DIAGNOSIS — R9089 Other abnormal findings on diagnostic imaging of central nervous system: Secondary | ICD-10-CM | POA: Diagnosis not present

## 2018-09-02 DIAGNOSIS — T50904A Poisoning by unspecified drugs, medicaments and biological substances, undetermined, initial encounter: Secondary | ICD-10-CM | POA: Diagnosis not present

## 2018-09-02 DIAGNOSIS — F17210 Nicotine dependence, cigarettes, uncomplicated: Secondary | ICD-10-CM | POA: Diagnosis not present

## 2018-09-02 DIAGNOSIS — R4182 Altered mental status, unspecified: Secondary | ICD-10-CM | POA: Diagnosis not present

## 2018-09-02 DIAGNOSIS — N189 Chronic kidney disease, unspecified: Secondary | ICD-10-CM | POA: Diagnosis not present

## 2018-09-02 DIAGNOSIS — A419 Sepsis, unspecified organism: Secondary | ICD-10-CM | POA: Diagnosis not present

## 2018-09-02 DIAGNOSIS — J9602 Acute respiratory failure with hypercapnia: Secondary | ICD-10-CM | POA: Diagnosis not present

## 2018-09-02 DIAGNOSIS — R4020 Unspecified coma: Secondary | ICD-10-CM | POA: Diagnosis not present

## 2018-09-02 DIAGNOSIS — R0902 Hypoxemia: Secondary | ICD-10-CM | POA: Diagnosis not present

## 2018-09-02 DIAGNOSIS — R404 Transient alteration of awareness: Secondary | ICD-10-CM | POA: Diagnosis not present

## 2018-09-02 DIAGNOSIS — N179 Acute kidney failure, unspecified: Secondary | ICD-10-CM | POA: Diagnosis not present

## 2018-09-02 DIAGNOSIS — R918 Other nonspecific abnormal finding of lung field: Secondary | ICD-10-CM | POA: Diagnosis not present

## 2018-09-02 DIAGNOSIS — Z4682 Encounter for fitting and adjustment of non-vascular catheter: Secondary | ICD-10-CM | POA: Diagnosis not present

## 2018-09-02 DIAGNOSIS — E669 Obesity, unspecified: Secondary | ICD-10-CM | POA: Diagnosis not present

## 2018-09-02 DIAGNOSIS — T50901A Poisoning by unspecified drugs, medicaments and biological substances, accidental (unintentional), initial encounter: Secondary | ICD-10-CM | POA: Diagnosis not present

## 2018-09-02 DIAGNOSIS — R0689 Other abnormalities of breathing: Secondary | ICD-10-CM | POA: Diagnosis not present

## 2018-09-02 DIAGNOSIS — F319 Bipolar disorder, unspecified: Secondary | ICD-10-CM | POA: Diagnosis not present

## 2018-09-03 DIAGNOSIS — T50904A Poisoning by unspecified drugs, medicaments and biological substances, undetermined, initial encounter: Secondary | ICD-10-CM | POA: Diagnosis not present

## 2018-09-03 DIAGNOSIS — J9601 Acute respiratory failure with hypoxia: Secondary | ICD-10-CM | POA: Diagnosis not present

## 2018-09-03 DIAGNOSIS — R4182 Altered mental status, unspecified: Secondary | ICD-10-CM | POA: Diagnosis not present

## 2018-09-03 DIAGNOSIS — N179 Acute kidney failure, unspecified: Secondary | ICD-10-CM | POA: Diagnosis not present

## 2018-09-04 DIAGNOSIS — G9389 Other specified disorders of brain: Secondary | ICD-10-CM | POA: Diagnosis not present

## 2018-09-04 DIAGNOSIS — I639 Cerebral infarction, unspecified: Secondary | ICD-10-CM | POA: Diagnosis not present

## 2018-09-04 DIAGNOSIS — F17200 Nicotine dependence, unspecified, uncomplicated: Secondary | ICD-10-CM | POA: Diagnosis not present

## 2018-09-04 DIAGNOSIS — Z4682 Encounter for fitting and adjustment of non-vascular catheter: Secondary | ICD-10-CM | POA: Diagnosis not present

## 2018-09-04 DIAGNOSIS — R7989 Other specified abnormal findings of blood chemistry: Secondary | ICD-10-CM | POA: Diagnosis not present

## 2018-09-04 DIAGNOSIS — Z978 Presence of other specified devices: Secondary | ICD-10-CM | POA: Diagnosis not present

## 2018-09-04 DIAGNOSIS — I619 Nontraumatic intracerebral hemorrhage, unspecified: Secondary | ICD-10-CM | POA: Diagnosis not present

## 2018-09-04 DIAGNOSIS — M6282 Rhabdomyolysis: Secondary | ICD-10-CM | POA: Diagnosis not present

## 2018-09-04 DIAGNOSIS — F329 Major depressive disorder, single episode, unspecified: Secondary | ICD-10-CM | POA: Diagnosis not present

## 2018-09-04 DIAGNOSIS — I10 Essential (primary) hypertension: Secondary | ICD-10-CM | POA: Diagnosis not present

## 2018-09-04 DIAGNOSIS — G919 Hydrocephalus, unspecified: Secondary | ICD-10-CM | POA: Diagnosis not present

## 2018-09-04 DIAGNOSIS — R0603 Acute respiratory distress: Secondary | ICD-10-CM | POA: Diagnosis not present

## 2018-09-04 DIAGNOSIS — Z9911 Dependence on respirator [ventilator] status: Secondary | ICD-10-CM | POA: Diagnosis not present

## 2018-09-04 DIAGNOSIS — G911 Obstructive hydrocephalus: Secondary | ICD-10-CM | POA: Diagnosis not present

## 2018-09-04 DIAGNOSIS — F419 Anxiety disorder, unspecified: Secondary | ICD-10-CM | POA: Diagnosis not present

## 2018-09-05 DIAGNOSIS — I639 Cerebral infarction, unspecified: Secondary | ICD-10-CM | POA: Diagnosis not present

## 2018-09-05 DIAGNOSIS — G919 Hydrocephalus, unspecified: Secondary | ICD-10-CM | POA: Diagnosis not present

## 2018-09-05 DIAGNOSIS — I10 Essential (primary) hypertension: Secondary | ICD-10-CM | POA: Diagnosis not present

## 2018-09-05 DIAGNOSIS — Z982 Presence of cerebrospinal fluid drainage device: Secondary | ICD-10-CM | POA: Diagnosis not present

## 2018-09-05 DIAGNOSIS — G9389 Other specified disorders of brain: Secondary | ICD-10-CM | POA: Diagnosis not present

## 2018-09-05 DIAGNOSIS — I614 Nontraumatic intracerebral hemorrhage in cerebellum: Secondary | ICD-10-CM | POA: Diagnosis not present

## 2018-09-05 DIAGNOSIS — I6523 Occlusion and stenosis of bilateral carotid arteries: Secondary | ICD-10-CM | POA: Diagnosis not present

## 2018-09-06 DIAGNOSIS — G9340 Encephalopathy, unspecified: Secondary | ICD-10-CM | POA: Diagnosis not present

## 2018-09-06 DIAGNOSIS — I639 Cerebral infarction, unspecified: Secondary | ICD-10-CM | POA: Diagnosis not present

## 2018-09-06 DIAGNOSIS — D72829 Elevated white blood cell count, unspecified: Secondary | ICD-10-CM | POA: Diagnosis not present

## 2018-09-06 DIAGNOSIS — R7989 Other specified abnormal findings of blood chemistry: Secondary | ICD-10-CM | POA: Diagnosis not present

## 2018-09-06 DIAGNOSIS — G936 Cerebral edema: Secondary | ICD-10-CM | POA: Diagnosis not present

## 2018-09-06 DIAGNOSIS — Q048 Other specified congenital malformations of brain: Secondary | ICD-10-CM | POA: Diagnosis not present

## 2018-09-06 DIAGNOSIS — R0689 Other abnormalities of breathing: Secondary | ICD-10-CM | POA: Diagnosis not present

## 2018-09-06 DIAGNOSIS — G911 Obstructive hydrocephalus: Secondary | ICD-10-CM | POA: Diagnosis not present

## 2018-09-06 DIAGNOSIS — Z9911 Dependence on respirator [ventilator] status: Secondary | ICD-10-CM | POA: Diagnosis not present

## 2018-09-06 DIAGNOSIS — M6282 Rhabdomyolysis: Secondary | ICD-10-CM | POA: Diagnosis not present

## 2018-09-06 DIAGNOSIS — F419 Anxiety disorder, unspecified: Secondary | ICD-10-CM | POA: Diagnosis not present

## 2018-09-06 DIAGNOSIS — F17200 Nicotine dependence, unspecified, uncomplicated: Secondary | ICD-10-CM | POA: Diagnosis not present

## 2018-09-06 DIAGNOSIS — J9690 Respiratory failure, unspecified, unspecified whether with hypoxia or hypercapnia: Secondary | ICD-10-CM | POA: Diagnosis not present

## 2018-09-06 DIAGNOSIS — F329 Major depressive disorder, single episode, unspecified: Secondary | ICD-10-CM | POA: Diagnosis not present

## 2018-09-07 DIAGNOSIS — E876 Hypokalemia: Secondary | ICD-10-CM | POA: Diagnosis not present

## 2018-09-07 DIAGNOSIS — R0689 Other abnormalities of breathing: Secondary | ICD-10-CM | POA: Diagnosis not present

## 2018-09-07 DIAGNOSIS — Z9911 Dependence on respirator [ventilator] status: Secondary | ICD-10-CM | POA: Diagnosis not present

## 2018-09-07 DIAGNOSIS — F419 Anxiety disorder, unspecified: Secondary | ICD-10-CM | POA: Diagnosis not present

## 2018-09-07 DIAGNOSIS — I10 Essential (primary) hypertension: Secondary | ICD-10-CM | POA: Diagnosis not present

## 2018-09-07 DIAGNOSIS — R7989 Other specified abnormal findings of blood chemistry: Secondary | ICD-10-CM | POA: Diagnosis not present

## 2018-09-07 DIAGNOSIS — I639 Cerebral infarction, unspecified: Secondary | ICD-10-CM | POA: Diagnosis not present

## 2018-09-07 DIAGNOSIS — D72829 Elevated white blood cell count, unspecified: Secondary | ICD-10-CM | POA: Diagnosis not present

## 2018-09-07 DIAGNOSIS — M6282 Rhabdomyolysis: Secondary | ICD-10-CM | POA: Diagnosis not present

## 2018-09-07 DIAGNOSIS — G911 Obstructive hydrocephalus: Secondary | ICD-10-CM | POA: Diagnosis not present

## 2018-09-07 DIAGNOSIS — F329 Major depressive disorder, single episode, unspecified: Secondary | ICD-10-CM | POA: Diagnosis not present

## 2018-09-07 DIAGNOSIS — G9340 Encephalopathy, unspecified: Secondary | ICD-10-CM | POA: Diagnosis not present

## 2018-09-08 DIAGNOSIS — M6282 Rhabdomyolysis: Secondary | ICD-10-CM | POA: Diagnosis not present

## 2018-09-08 DIAGNOSIS — I639 Cerebral infarction, unspecified: Secondary | ICD-10-CM | POA: Diagnosis not present

## 2018-09-08 DIAGNOSIS — E876 Hypokalemia: Secondary | ICD-10-CM | POA: Diagnosis not present

## 2018-09-08 DIAGNOSIS — G9340 Encephalopathy, unspecified: Secondary | ICD-10-CM | POA: Diagnosis not present

## 2018-09-08 DIAGNOSIS — G911 Obstructive hydrocephalus: Secondary | ICD-10-CM | POA: Diagnosis not present

## 2018-09-08 DIAGNOSIS — F419 Anxiety disorder, unspecified: Secondary | ICD-10-CM | POA: Diagnosis not present

## 2018-09-08 DIAGNOSIS — Z9911 Dependence on respirator [ventilator] status: Secondary | ICD-10-CM | POA: Diagnosis not present

## 2018-09-08 DIAGNOSIS — I10 Essential (primary) hypertension: Secondary | ICD-10-CM | POA: Diagnosis not present

## 2018-09-08 DIAGNOSIS — F329 Major depressive disorder, single episode, unspecified: Secondary | ICD-10-CM | POA: Diagnosis not present

## 2018-09-08 DIAGNOSIS — R0689 Other abnormalities of breathing: Secondary | ICD-10-CM | POA: Diagnosis not present

## 2018-09-08 DIAGNOSIS — R7989 Other specified abnormal findings of blood chemistry: Secondary | ICD-10-CM | POA: Diagnosis not present

## 2018-09-08 DIAGNOSIS — F17200 Nicotine dependence, unspecified, uncomplicated: Secondary | ICD-10-CM | POA: Diagnosis not present

## 2018-09-09 DIAGNOSIS — F419 Anxiety disorder, unspecified: Secondary | ICD-10-CM | POA: Diagnosis not present

## 2018-09-09 DIAGNOSIS — F329 Major depressive disorder, single episode, unspecified: Secondary | ICD-10-CM | POA: Diagnosis not present

## 2018-09-09 DIAGNOSIS — G9340 Encephalopathy, unspecified: Secondary | ICD-10-CM | POA: Diagnosis not present

## 2018-09-09 DIAGNOSIS — R7989 Other specified abnormal findings of blood chemistry: Secondary | ICD-10-CM | POA: Diagnosis not present

## 2018-09-09 DIAGNOSIS — M6282 Rhabdomyolysis: Secondary | ICD-10-CM | POA: Diagnosis not present

## 2018-09-09 DIAGNOSIS — I10 Essential (primary) hypertension: Secondary | ICD-10-CM | POA: Diagnosis not present

## 2018-09-09 DIAGNOSIS — G911 Obstructive hydrocephalus: Secondary | ICD-10-CM | POA: Diagnosis not present

## 2018-09-09 DIAGNOSIS — F17200 Nicotine dependence, unspecified, uncomplicated: Secondary | ICD-10-CM | POA: Diagnosis not present

## 2018-09-09 DIAGNOSIS — E876 Hypokalemia: Secondary | ICD-10-CM | POA: Diagnosis not present

## 2018-09-09 DIAGNOSIS — I639 Cerebral infarction, unspecified: Secondary | ICD-10-CM | POA: Diagnosis not present

## 2018-09-09 DIAGNOSIS — D72829 Elevated white blood cell count, unspecified: Secondary | ICD-10-CM | POA: Diagnosis not present

## 2018-09-10 DIAGNOSIS — R7989 Other specified abnormal findings of blood chemistry: Secondary | ICD-10-CM | POA: Diagnosis not present

## 2018-09-10 DIAGNOSIS — F329 Major depressive disorder, single episode, unspecified: Secondary | ICD-10-CM | POA: Diagnosis not present

## 2018-09-10 DIAGNOSIS — F419 Anxiety disorder, unspecified: Secondary | ICD-10-CM | POA: Diagnosis not present

## 2018-09-10 DIAGNOSIS — I639 Cerebral infarction, unspecified: Secondary | ICD-10-CM | POA: Diagnosis not present

## 2018-09-10 DIAGNOSIS — M6282 Rhabdomyolysis: Secondary | ICD-10-CM | POA: Diagnosis not present

## 2018-09-10 DIAGNOSIS — R740 Nonspecific elevation of levels of transaminase and lactic acid dehydrogenase [LDH]: Secondary | ICD-10-CM | POA: Diagnosis not present

## 2018-09-10 DIAGNOSIS — G911 Obstructive hydrocephalus: Secondary | ICD-10-CM | POA: Diagnosis not present

## 2018-09-10 DIAGNOSIS — G9340 Encephalopathy, unspecified: Secondary | ICD-10-CM | POA: Diagnosis not present

## 2018-09-10 DIAGNOSIS — F17200 Nicotine dependence, unspecified, uncomplicated: Secondary | ICD-10-CM | POA: Diagnosis not present

## 2018-09-10 DIAGNOSIS — G936 Cerebral edema: Secondary | ICD-10-CM | POA: Diagnosis not present

## 2018-09-10 DIAGNOSIS — G919 Hydrocephalus, unspecified: Secondary | ICD-10-CM | POA: Diagnosis not present

## 2018-09-11 DIAGNOSIS — G9340 Encephalopathy, unspecified: Secondary | ICD-10-CM | POA: Diagnosis not present

## 2018-09-11 DIAGNOSIS — F329 Major depressive disorder, single episode, unspecified: Secondary | ICD-10-CM | POA: Diagnosis not present

## 2018-09-11 DIAGNOSIS — I639 Cerebral infarction, unspecified: Secondary | ICD-10-CM | POA: Diagnosis not present

## 2018-09-11 DIAGNOSIS — R7989 Other specified abnormal findings of blood chemistry: Secondary | ICD-10-CM | POA: Diagnosis not present

## 2018-09-11 DIAGNOSIS — G911 Obstructive hydrocephalus: Secondary | ICD-10-CM | POA: Diagnosis not present

## 2018-09-11 DIAGNOSIS — M6282 Rhabdomyolysis: Secondary | ICD-10-CM | POA: Diagnosis not present

## 2018-09-11 DIAGNOSIS — F419 Anxiety disorder, unspecified: Secondary | ICD-10-CM | POA: Diagnosis not present

## 2018-09-11 DIAGNOSIS — F17200 Nicotine dependence, unspecified, uncomplicated: Secondary | ICD-10-CM | POA: Diagnosis not present

## 2018-09-12 DIAGNOSIS — I639 Cerebral infarction, unspecified: Secondary | ICD-10-CM | POA: Diagnosis not present

## 2018-09-12 DIAGNOSIS — G911 Obstructive hydrocephalus: Secondary | ICD-10-CM | POA: Diagnosis not present

## 2018-09-12 DIAGNOSIS — G936 Cerebral edema: Secondary | ICD-10-CM | POA: Diagnosis not present

## 2018-09-12 DIAGNOSIS — F329 Major depressive disorder, single episode, unspecified: Secondary | ICD-10-CM | POA: Diagnosis not present

## 2018-09-12 DIAGNOSIS — G9340 Encephalopathy, unspecified: Secondary | ICD-10-CM | POA: Diagnosis not present

## 2018-09-12 DIAGNOSIS — R7989 Other specified abnormal findings of blood chemistry: Secondary | ICD-10-CM | POA: Diagnosis not present

## 2018-09-12 DIAGNOSIS — M6282 Rhabdomyolysis: Secondary | ICD-10-CM | POA: Diagnosis not present

## 2018-09-12 DIAGNOSIS — F419 Anxiety disorder, unspecified: Secondary | ICD-10-CM | POA: Diagnosis not present

## 2018-09-13 DIAGNOSIS — Z9889 Other specified postprocedural states: Secondary | ICD-10-CM | POA: Diagnosis not present

## 2018-09-13 DIAGNOSIS — Z982 Presence of cerebrospinal fluid drainage device: Secondary | ICD-10-CM | POA: Diagnosis not present

## 2018-09-13 DIAGNOSIS — G918 Other hydrocephalus: Secondary | ICD-10-CM | POA: Diagnosis not present

## 2018-09-13 DIAGNOSIS — I6389 Other cerebral infarction: Secondary | ICD-10-CM | POA: Diagnosis not present

## 2018-10-02 DIAGNOSIS — G4700 Insomnia, unspecified: Secondary | ICD-10-CM | POA: Diagnosis not present

## 2018-11-03 DIAGNOSIS — M25562 Pain in left knee: Secondary | ICD-10-CM | POA: Diagnosis not present

## 2018-11-03 DIAGNOSIS — I129 Hypertensive chronic kidney disease with stage 1 through stage 4 chronic kidney disease, or unspecified chronic kidney disease: Secondary | ICD-10-CM | POA: Diagnosis not present

## 2018-11-03 DIAGNOSIS — M25561 Pain in right knee: Secondary | ICD-10-CM | POA: Diagnosis not present

## 2018-11-03 DIAGNOSIS — Z79899 Other long term (current) drug therapy: Secondary | ICD-10-CM | POA: Diagnosis not present

## 2018-11-03 DIAGNOSIS — N189 Chronic kidney disease, unspecified: Secondary | ICD-10-CM | POA: Diagnosis not present

## 2018-11-03 DIAGNOSIS — G8929 Other chronic pain: Secondary | ICD-10-CM | POA: Diagnosis not present

## 2018-11-03 DIAGNOSIS — Z7982 Long term (current) use of aspirin: Secondary | ICD-10-CM | POA: Diagnosis not present

## 2018-11-03 DIAGNOSIS — E1122 Type 2 diabetes mellitus with diabetic chronic kidney disease: Secondary | ICD-10-CM | POA: Diagnosis not present

## 2018-11-03 DIAGNOSIS — F17210 Nicotine dependence, cigarettes, uncomplicated: Secondary | ICD-10-CM | POA: Diagnosis not present

## 2018-11-05 ENCOUNTER — Ambulatory Visit (INDEPENDENT_AMBULATORY_CARE_PROVIDER_SITE_OTHER): Payer: Medicare Other | Admitting: Neurology

## 2018-11-05 ENCOUNTER — Other Ambulatory Visit: Payer: Self-pay

## 2018-11-05 ENCOUNTER — Encounter: Payer: Self-pay | Admitting: Neurology

## 2018-11-05 VITALS — BP 142/105 | HR 93 | Ht 65.0 in | Wt 196.0 lb

## 2018-11-05 DIAGNOSIS — Z820 Family history of epilepsy and other diseases of the nervous system: Secondary | ICD-10-CM

## 2018-11-05 DIAGNOSIS — F1190 Opioid use, unspecified, uncomplicated: Secondary | ICD-10-CM

## 2018-11-05 DIAGNOSIS — R0683 Snoring: Secondary | ICD-10-CM

## 2018-11-05 DIAGNOSIS — F17200 Nicotine dependence, unspecified, uncomplicated: Secondary | ICD-10-CM

## 2018-11-05 DIAGNOSIS — G479 Sleep disorder, unspecified: Secondary | ICD-10-CM | POA: Diagnosis not present

## 2018-11-05 DIAGNOSIS — R351 Nocturia: Secondary | ICD-10-CM

## 2018-11-05 DIAGNOSIS — Z8679 Personal history of other diseases of the circulatory system: Secondary | ICD-10-CM

## 2018-11-05 DIAGNOSIS — E669 Obesity, unspecified: Secondary | ICD-10-CM

## 2018-11-05 DIAGNOSIS — G4700 Insomnia, unspecified: Secondary | ICD-10-CM

## 2018-11-05 NOTE — Patient Instructions (Signed)

## 2018-11-05 NOTE — Progress Notes (Signed)
Subjective:    Patient ID: Jeanne Leach is a 53 y.o. female.  HPI     Star Age, MD, PhD [facility] [address] [place], [place] [address]  Dear Nira Conn,   I saw your patient, Jeanne Leach, upon your kind request in my sleep clinic today for initial consultation of her sleep disorder, in particular, her difficulty initiating and maintaining sleep.  The patient is accompanied by her daughter today. As you know, Jeanne Leach is a 53 year old right-handed woman with an underlying medical history of hypertension, hypothyroidism, anxiety, chronic pain, on narcotic pain medication, depression, asthma, seasonal allergies, degenerative disc disease, and obesity, who reports a longer standing history of difficulty with initiating sleep and maintaining sleep.  She snores.  She has tried over-the-counter medication including melatonin.  She has been on trazodone for years, currently 100 mg each night. I reviewed your office note from 10/02/2018, which you kindly included.  Of note, she is on several potentially sedating medications including Xanax 1 mg 3 times a day, E citalopram 10 mg daily and 20 mg daily, oxycodone 10-3 25 1  tablet 4 times a day as needed, trazodone 100 mg at bedtime.  Of note, she was hospitalized in November 2019 with sepsis.  She was found to have pyelonephritis and acute encephalopathy deemed secondary to infection but also deemed secondary to taking narcotic and benzodiazepine medication.  She was advised to reduce these medications and gradually taper.  Of note, she was also hospitalized in May 2020 under neurosurgery, she had craniotomy for cerebellar hemorrhages.  She is currently not on Percocet as she ran out.  Her Epworth sleepiness score is 6 out of 24, fatigue severity score is 52 out of 63.  She has a family history of obstructive sleep apnea including her mother who uses a CPAP machine and her daughter who is with her today, who  also uses a CPAP machine.  She has a total of 3 grown children.  Her husband passed away recently on 09-25-2018.  Her daughter moved in with her, also in the household is patient's son-in-law.  They have 3 dogs in the household.  She has a variable sleep schedule, tries to be in bed between 9 and 10 and typically watches TV in bed, TV tends to stay on at night.  She has nocturia at least twice per average night and denies recurrent morning headaches.  She currently smokes about 10 or more cigarettes per day, does not drink any alcohol currently, drinks caffeine in the form of soda, about 6 cans/day on average and sweet tea about twice a week.  She does not drink much in the way of water she admits.  Her Past Medical History Is Significant For: Past Medical History:  Diagnosis Date  . Anxiety   . Bipolar 1 disorder ([place])   . Chronic lower back pain   . Degenerative disc disease, cervical   . Degenerative disc disease, lumbar   . Depression   . Heart murmur   . High cholesterol   . Hypertension   . Hypothyroidism     Her Past Surgical History Is Significant For: Past Surgical History:  Procedure Laterality Date  . CARPAL TUNNEL RELEASE Left   . CESAREAN SECTION  1999  . LIPOMA EXCISION Left    "above my breast"    Her Family History Is Significant For: Family History  Problem Relation Age of Onset  . Hypertension Other   . Diabetes Mellitus  II Mother     Her Social History Is Significant For: Social History   Socioeconomic History  . Marital status: Married    Spouse name: Not on file  . Number of children: Not on file  . Years of education: Not on file  . Highest education level: Not on file  Occupational History  . Not on file  Social Needs  . Financial resource strain: Not on file  . Food insecurity    Worry: Not on file    Inability: Not on file  . Transportation needs    Medical: Not on file    Non-medical: Not on file  Tobacco Use  . Smoking status: Current  Every Day Smoker    Packs/day: 1.00    Years: 35.00    Pack years: 35.00    Types: Cigarettes  . Smokeless tobacco: Former NeurosurgeonUser    Types: Chew  Substance and Sexual Activity  . Alcohol use: Not Currently    Alcohol/week: 0.0 standard drinks  . Drug use: Yes    Types: Oxycodone    Comment: Prescribed opioids, benzo (Xanax), and amph (Vyvanse)  . Sexual activity: Yes  Lifestyle  . Physical activity    Days per week: Not on file    Minutes per session: Not on file  . Stress: Not on file  Relationships  . Social Musicianconnections    Talks on phone: Not on file    Gets together: Not on file    Attends religious service: Not on file    Active member of club or organization: Not on file    Attends meetings of clubs or organizations: Not on file    Relationship status: Not on file  Other Topics Concern  . Not on file  Social History Narrative  . Not on file    Her Allergies Are:  No Known Allergies:   Her Current Medications Are:  Outpatient Encounter Medications as of 11/05/2018  Medication Sig  . albuterol (VENTOLIN HFA) 108 (90 Base) MCG/ACT inhaler Inhale into the lungs every 6 (six) hours as needed for wheezing or shortness of breath.  . ALPRAZolam (XANAX) 1 MG tablet Take 1 mg by mouth 3 (three) times daily.  Marland Kitchen. escitalopram (LEXAPRO) 10 MG tablet Take 10 mg by mouth daily.  Marland Kitchen. escitalopram (LEXAPRO) 20 MG tablet Take 20 mg by mouth daily.  . fluticasone (FLONASE) 50 MCG/ACT nasal spray Place into both nostrils daily.  . irbesartan (AVAPRO) 150 MG tablet Take 150 mg by mouth daily.  Marland Kitchen. loratadine (CLARITIN) 10 MG tablet Take 10 mg by mouth daily.  . Olmesartan-amLODIPine-HCTZ 40-10-25 MG TABS Take 1 tablet by mouth daily.  . traZODone (DESYREL) 100 MG tablet Take 100 mg by mouth at bedtime.  Marland Kitchen. oxyCODONE-acetaminophen (PERCOCET) 10-325 MG tablet Take 0.5 tablets by mouth every 6 (six) hours as needed for pain. (Patient not taking: Reported on 11/05/2018)  . [DISCONTINUED] amLODipine  (NORVASC) 5 MG tablet Take 1 tablet (5 mg total) by mouth daily.  . [DISCONTINUED] chlorthalidone (HYGROTON) 25 MG tablet Take 25 mg by mouth daily.   No facility-administered encounter medications on file as of 11/05/2018.   :  Review of Systems:  Out of a complete 14 point review of systems, all are reviewed and negative with the exception of these symptoms as listed below: Review of Systems  Neurological:       Pt presents today to discuss her sleep. Pt has never had a sleep study but does endorse snoring.  Epworth  Sleepiness Scale 0= would never doze 1= slight chance of dozing 2= moderate chance of dozing 3= high chance of dozing  Sitting and reading: 2 Watching TV: 2 Sitting inactive in a public place (ex. Theater or meeting): 0 As a passenger in a car for an hour without a break: 0 Lying down to rest in the afternoon: 2 Sitting and talking to someone: 0 Sitting quietly after lunch (no alcohol): 0 In a car, while stopped in traffic: 0 Total: 6     Objective:  Neurological Exam  Physical Exam Physical Examination:   Vitals:   11/05/18 1319  BP: (!) 142/105  Pulse: 93   General Examination: The patient is a very pleasant 53 y.o. female in no acute distress. She appears well-developed and well-nourished and adequately groomed.   HEENT: Normocephalic, atraumatic, pupils are equal, round and reactive to light and accommodation.  Extraocular tracking is good without limitation to gaze excursion or nystagmus noted. Normal smooth pursuit is noted. Hearing is grossly intact. Face is symmetric with normal facial animation and normal facial sensation. Speech is clear with no dysarthria noted. There is no hypophonia. There is no lip, neck/head, jaw or voice tremor. Neck is supple with full range of passive and active motion. There are no carotid bruits on auscultation. Oropharynx exam reveals: moderate mouth dryness, marginal dental hygiene and moderate airway crowding, due to Small  airway entry, tonsils of about 1+, Mallampati is class III.  Tongue protrudes centrally in palate elevates symmetrically.  She does not have much in the way of overbite and a slightly skewed teeth alignment.   Chest: Clear to auscultation without wheezing, rhonchi or crackles noted.  Heart: S1+S2+0, regular and normal without murmurs, rubs or gallops noted.   Abdomen: Soft, non-tender and non-distended with normal bowel sounds appreciated on auscultation.  Extremities: There is no pitting edema in the distal lower extremities bilaterally.   Skin: Warm and dry without trophic changes noted.  Musculoskeletal: exam reveals Swelling and deformity including genu valgus of the right knee.  Neurologically:  Mental status: The patient is awake, alert and oriented in all 4 spheres. Her immediate and remote memory, attention, language skills and fund of knowledge are appropriate. There is no evidence of aphasia, agnosia, apraxia or anomia. Speech is clear with normal prosody and enunciation. Thought process is linear. Mood is normal and affect is constricted.  Cranial nerves II - XII are as described above under HEENT exam. Motor exam: Normal bulk, strength and tone is noted. There is no drift, or tremor. Fine motor skills and coordination: grossly intact.  Cerebellar testing: No dysmetria or intention tremor.  Sensory exam: intact to light touch in the upper and lower extremities.  Gait, station and balance: She stands with difficulty. No veering to one side is noted. No leaning to one side is noted. Posture is age-appropriate and stance is slightly wide based. Gait shows a limp on the R, no walking aid.   Assessment and Plan:  In summary, Jeanne Leach is a very pleasant 53 y.o.-year old female  with an underlying medical history of hypertension, hypothyroidism, anxiety, chronic pain, on narcotic pain medication, depression, asthma, seasonal allergies, degenerative disc disease, and obesity, whose  history and physical exam are concerning for obstructive sleep apnea (OSA). I had a long chat with the patient and her about my findings and the diagnosis of OSA, its prognosis and treatment options. We talked about medical treatments, surgical interventions and non-pharmacological approaches. I explained in particular the  risks and ramifications of untreated moderate to severe OSA, especially with respect to developing cardiovascular disease down the Road, including congestive heart failure, difficult to treat hypertension, cardiac arrhythmias, or stroke. Even type 2 diabetes has, in part, been linked to untreated OSA. Symptoms of untreated OSA include daytime sleepiness, memory problems, mood irritability and mood disorder such as depression and anxiety, lack of energy, as well as recurrent headaches, especially morning headaches. We talked about smoking cessation and trying to maintain a healthy lifestyle in general, as well as the importance of weight control. I encouraged the patient to eat healthy, exercise daily and keep well hydrated, to keep a scheduled bedtime and wake time routine, to not skip any meals and eat healthy snacks in between meals. I advised the patient not to drive when feeling sleepy. I did not suggest any medication changes today.  She requested clearance to be able to continue to take her narcotic pain medication.  I explained to her that I do not provide clearance for such medications and I do not provide chronic pain management.  She is encouraged to follow-up with her surgeon in that regard or with you.   I recommended the following at this time: sleep study.   I explained the sleep test procedure to the patient and also outlined possible surgical and non-surgical treatment options of OSA, including the use of a custom-made dental device (which would require a referral to a specialist dentist or oral surgeon), upper airway surgical options, such as pillar implants, radiofrequency  surgery, tongue base surgery, and UPPP (which would involve a referral to an ENT surgeon). Rarely, jaw surgery such as mandibular advancement may be considered.  I also explained the CPAP treatment option to the patient, who indicated that she would be willing to try CPAP if the need arises. I explained the importance of being compliant with PAP treatment, not only for insurance purposes but primarily to improve Her symptoms, and for the patient's long term health benefit, including to reduce Her cardiovascular risks. I answered all their questions today and the patient and her daughter were in agreement. I plan to see her back after the sleep study is completed and encouraged her to call with any interim questions, concerns, problems or updates.   Thank you very much for allowing me to participate in the care of this nice patient. If I can be of any further assistance to you please do not hesitate to call me at [phone].  Sincerely,   Huston FoleySaima Malinda Mayden, MD, PhD

## 2018-11-06 DIAGNOSIS — G4700 Insomnia, unspecified: Secondary | ICD-10-CM | POA: Diagnosis not present

## 2018-11-06 DIAGNOSIS — G894 Chronic pain syndrome: Secondary | ICD-10-CM | POA: Diagnosis not present

## 2018-12-03 ENCOUNTER — Telehealth: Payer: Self-pay

## 2018-12-03 NOTE — Telephone Encounter (Signed)
We have attempted to call the patient two times to schedule sleep study.  Patient has been unavailable at the phone numbers we have on file and has not returned our calls. If patient calls back we will schedule them for their sleep study.  

## 2018-12-04 ENCOUNTER — Other Ambulatory Visit: Payer: Self-pay

## 2018-12-04 ENCOUNTER — Encounter: Payer: Self-pay | Admitting: Neurology

## 2018-12-04 ENCOUNTER — Telehealth: Payer: Self-pay | Admitting: Neurology

## 2018-12-04 ENCOUNTER — Ambulatory Visit (INDEPENDENT_AMBULATORY_CARE_PROVIDER_SITE_OTHER): Payer: Medicare Other | Admitting: Neurology

## 2018-12-04 VITALS — BP 92/60 | HR 88 | Ht 66.0 in | Wt 197.0 lb

## 2018-12-04 DIAGNOSIS — Z8673 Personal history of transient ischemic attack (TIA), and cerebral infarction without residual deficits: Secondary | ICD-10-CM

## 2018-12-04 DIAGNOSIS — Z8679 Personal history of other diseases of the circulatory system: Secondary | ICD-10-CM | POA: Diagnosis not present

## 2018-12-04 DIAGNOSIS — F17200 Nicotine dependence, unspecified, uncomplicated: Secondary | ICD-10-CM | POA: Diagnosis not present

## 2018-12-04 DIAGNOSIS — I619 Nontraumatic intracerebral hemorrhage, unspecified: Secondary | ICD-10-CM

## 2018-12-04 NOTE — Telephone Encounter (Signed)
Medicare order sent to GI. No auth they will reach out to the patient to schedule.  

## 2018-12-04 NOTE — Patient Instructions (Signed)
Continue exercising regularly and take your medications as directed. As discussed, secondary prevention is key after a stroke. This means: taking care of blood sugar values or diabetes management (A1c goal of less than 7.0), good blood pressure (hypertension) control and optimizing cholesterol management (with LDL goal of less than 70), exercising daily or regularly within your own mobility limitations of course, and overall cardiovascular risk factor reduction, which includes screening for and treatment of obstructive sleep apnea (OSA) and weight management.    Please consider scheduling your sleep study as recommended.  Please talk to your primary care provider about seeing a cardiologist for management of your blood pressure, which is low today, and to check for cardiac sources of your stroke.  Your echocardiogram can be ordered by your primary care physician or cardiologist.  I will order an ultrasound of the neck arteries, Called carotid Doppler.Please continue to work on weight loss and smoking cessation.  Try to hydrate better with water.  As recommended at the time of your hospital discharge, please follow-up with your neurosurgeon.  We will keep you posted as to your head CT results and carotid Doppler ultrasound results.  I will see you back as needed at this point.

## 2018-12-04 NOTE — Progress Notes (Signed)
Subjective:    Patient ID: Sammuel HinesDeborah Mclucas is a 53 y.o. female.  HPI     Interim history:   Dear Herbert SetaHeather,    I saw your patient, Sammuel HinesDeborah Dysert, upon your kind request in my neurologic clinic today for follow-up consultation after her history of stroke.  The patient is accompanied by her daughter again today.  I have recently evaluated her for her sleep disorder.  She has not yet scheduled her sleep study.  As you know,  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  I reviewed the hospital records.  She was admitted on 09/02/2018 with altered mental status associated with intoxication.  She was found to have bilateral cerebellar infarcts with hemorrhagic conversion.  She had surgery on 5 6 for surgical evacuation.  She was advised to stop taking Xanax and oxycodone 10 mg strength and discharged on oxycodone 5 mg strength as well as thiamine and trazodone 50 mg.  She was advised to follow-up with outpatient neurosurgery nurse practitioner.  I reviewed your office note from 10/02/2018.   She reports that she did not follow-up with neurosurgeon or the neurosurgical nurse practitioner.  Her daughter reports that they call the office and they were too booked out.  She has not had a follow-up scan.  She has not had any new symptoms, no recurrence of one-sided weakness or numbness, she has right knee pain which has been going on for 8 years or so.  She is trying to quit smoking, has reduced her smoking to half a pack per day.  She denies any alcohol use.  She does not drink water very much she admits, she has now increased her water intake to 1-2 bottles per day.  She reports that she has been  reluctant to pursue his sleep study because she does not want anybody to watch her sleep.  She is reassured with regards to that again today.  Her LDL on 09/04/2018 was 95.  Most recent A1c is not available for my review.  The patient's allergies, current medications, family history, past medical history, past social history, past surgical history and problem list were reviewed and updated as appropriate.    Previously:   11/05/2018: (She) reports a longer standing history of difficulty with initiating sleep and maintaining sleep.  She snores.  She has tried over-the-counter medication including melatonin.  She has been on trazodone for years, currently 100 mg each night. I reviewed your office note from 10/02/2018, which you kindly included.  Of note, she is on several potentially sedating medications including Xanax 1 mg 3 times a day, E citalopram 10 mg daily and 20 mg daily, oxycodone 10-3 25 1  tablet 4 times a day as needed, trazodone 100 mg at bedtime.  Of note, she was hospitalized in November 2019 with sepsis.  She was found to have pyelonephritis and acute encephalopathy deemed secondary to infection but also deemed secondary to taking narcotic and benzodiazepine medication.  She was advised to reduce these medications and gradually taper.  Of note, she was also hospitalized in May 2020 under neurosurgery, she had craniotomy for cerebellar hemorrhages.  She is currently not on Percocet as she ran out.  Her Epworth sleepiness score is 6 out of 24, fatigue severity score is  52 out of 63.  She has a family history of obstructive sleep apnea including her mother who uses a CPAP machine and her daughter who is with her today, who also uses a CPAP machine.  She has a total of 3 grown children.  Her husband passed away recently on 09/02/2018.  Her daughter moved in with her, also in the household is patient's son-in-law.  They have 3 dogs in the household.  She has a variable sleep schedule, tries to be in bed  between 9 and 10 and typically watches TV in bed, TV tends to stay on at night.  She has nocturia at least twice per average night and denies recurrent morning headaches.  She currently smokes about 10 or more cigarettes per day, does not drink any alcohol currently, drinks caffeine in the form of soda, about 6 cans/day on average and sweet tea about twice a week.  She does not drink much in the way of water she admits.  Her Past Medical History Is Significant For: Past Medical History:  Diagnosis Date  . Anxiety   . Bipolar 1 disorder (HCC)   . Chronic lower back pain   . Degenerative disc disease, cervical   . Degenerative disc disease, lumbar   . Depression   . Heart murmur   . High cholesterol   . Hypertension   . Hypothyroidism     Her Past Surgical History Is Significant For: Past Surgical History:  Procedure Laterality Date  . CARPAL TUNNEL RELEASE Left   . CESAREAN SECTION  1999  . LIPOMA EXCISION Left    "above my breast"    Her Family History Is Significant For: Family History  Problem Relation Age of Onset  . Hypertension Other   . Diabetes Mellitus II Mother     Her Social History Is Significant For: Social History   Socioeconomic History  . Marital status: Married    Spouse name: Not on file  . Number of children: Not on file  . Years of education: Not on file  . Highest education level: Not on file  Occupational History  . Not on file  Social Needs  . Financial resource strain: Not on file  . Food insecurity    Worry: Not on file    Inability: Not on file  . Transportation needs    Medical: Not on file    Non-medical: Not on file  Tobacco Use  . Smoking status: Current Every Day Smoker    Packs/day: 1.00    Years: 35.00    Pack years: 35.00    Types: Cigarettes  . Smokeless tobacco: Former NeurosurgeonUser    Types: Chew  Substance and Sexual Activity  . Alcohol use: Not Currently    Alcohol/week: 0.0 standard drinks  . Drug use: Yes    Types:  Oxycodone    Comment: Prescribed opioids, benzo (Xanax), and amph (Vyvanse)  . Sexual activity: Yes  Lifestyle  . Physical activity    Days per week: Not on file    Minutes per session: Not on file  . Stress: Not on file  Relationships  . Social Musicianconnections    Talks on phone: Not on file    Gets together: Not on file    Attends religious service: Not on file    Active member of club or organization: Not on file    Attends meetings of clubs or organizations: Not on file    Relationship status: Not on file  Other Topics Concern  .  Not on file  Social History Narrative  . Not on file    Her Allergies Are:  No Known Allergies:   Her Current Medications Are:  Outpatient Encounter Medications as of 12/04/2018  Medication Sig  . albuterol (VENTOLIN HFA) 108 (90 Base) MCG/ACT inhaler Inhale into the lungs every 6 (six) hours as needed for wheezing or shortness of breath.  . escitalopram (LEXAPRO) 20 MG tablet Take 20 mg by mouth daily.  . fluticasone (FLONASE) 50 MCG/ACT nasal spray Place into both nostrils daily.  . irbesartan (AVAPRO) 150 MG tablet Take 150 mg by mouth daily.  Marland Kitchen. loratadine (CLARITIN) 10 MG tablet Take 10 mg by mouth daily.  . Olmesartan-amLODIPine-HCTZ 40-10-25 MG TABS Take 1 tablet by mouth daily.  . traZODone (DESYREL) 100 MG tablet Take 100 mg by mouth at bedtime.  . ALPRAZolam (XANAX) 1 MG tablet Take 1 mg by mouth 3 (three) times daily.  Marland Kitchen. oxyCODONE-acetaminophen (PERCOCET) 10-325 MG tablet Take 0.5 tablets by mouth every 6 (six) hours as needed for pain. (Patient not taking: Reported on 11/05/2018)  . [DISCONTINUED] escitalopram (LEXAPRO) 10 MG tablet Take 10 mg by mouth daily.   No facility-administered encounter medications on file as of 12/04/2018.   :  Review of Systems:  Out of a complete 14 point review of systems, all are reviewed and negative with the exception of these symptoms as listed below:   Review of Systems  Neurological:       Pt presents  today to discuss her brain surgery. She reports that she needs clearance to take her oxycodone and xanax again from neurology.    Objective:  Neurological Exam  Physical Exam Physical Examination:   Vitals:   12/04/18 1315  BP: 92/60  Pulse: 88   General Examination: The patient is a very pleasant 53 y.o. female in no acute distress. She appears Mildly deconditioned, adequately groomed.  HEENT: Normocephalic, atraumatic, pupils are equal, round and reactive to light and accommodation.  Extraocular tracking is good without limitation to gaze excursion or nystagmus noted. Normal smooth pursuit is noted. Hearing is grossly intact. Face is symmetric with normal facial animation and normal facial sensation. Speech is clear with no dysarthria noted. There is no hypophonia. There is no lip, neck/head, jaw or voice tremor. Neck is supple with full range of passive and active motion. There are no carotid bruits on auscultation. Oropharynx exam reveals: moderate mouth dryness, marginal dental hygiene and moderate airway crowding. Tongue protrudes centrally in palate elevates symmetrically.   Chest: Clear to auscultation without wheezing, rhonchi or crackles noted.  Heart: S1+S2+0, regular and normal without murmurs, rubs or gallops noted.   Abdomen: Soft, non-tender and non-distended with normal bowel sounds appreciated on auscultation.  Extremities: There is no pitting edema in the distal lower extremities bilaterally.   Skin: Warm and dry without trophic changes noted.  Musculoskeletal: exam reveals Swelling and deformity including genu valgus of the right knee. Mild R knee discomfort, stable.  Neurologically:  Mental status: The patient is awake, alert and oriented in all 4 spheres. Her immediate and remote memory, attention, language skills and fund of knowledge are appropriate. There is no evidence of aphasia, agnosia, apraxia or anomia. Speech is clear with normal prosody and  enunciation. Thought process is linear. Mood is normal and affect is constricted.  Cranial nerves II - XII are as described above under HEENT exam. Motor exam: Normal bulk, strength and tone is noted. There is no drift, or resting, action or  postural tremor. Fine motor skills and coordination: grossly intact.  Cerebellar testing: No dysmetria or intention tremor. Finger-to-nose testing is unremarkable, heel-to-shin also unremarkable. Sensory exam: intact to light touch in the upper and lower extremities.  Gait, station and balance: She stands with difficulty. No veering to one side is noted. No leaning to one side is noted. Posture is age-appropriate and stance is slightly wide based. Gait shows a limp on the R, no walking aid.   Assessment and Plan:  In summary, Skylyn Slezak is a 53 year old female with an underlying medical history of hypertension, hypothyroidism, anxiety, chronic pain, on narcotic pain medication, depression, asthma, seasonal allergies, degenerative disc disease, and obesity, who Presents for consultation after her cerebellar strokes in May 2020.  She had hemorrhagic transformation of the strokeAnd needed neurosurgical intervention.  She has not followed up with the surgeon afterwards and is encouraged to do so. She has had no residual neurological symptoms.  She has had no neurological symptoms since her hospital discharge, no TIA-like symptoms thankfully.  Comparison imaging tests would be helpful but I suggested I go ahead and order a brain MRI without contrast.  She is advised to proceed with a carotid Doppler ultrasound which I will order as well.  She is encouraged to talk to about seeing a cardiologist in pursuing blood pressure management and a echocardiogram with the help of cardiology.  Please continue to manage her cholesterol.  She is advised regarding secondary stroke prevention which includes blood pressure control, obesity management, cholesterol management with LDL goal  of less than 70, blood sugar management with an A1c goal of less than 7.0, and sleep apnea screening and treatment. She is encouraged to hydrate better with water. She is still reluctant to proceed with a sleep study and is again encouraged to proceed with sleep study testing to rule out underlying obstructive sleep apnea.  Treating sleep apnea would also be part of secondary prevention. She was given the name and phone number of the neurosurgical office she was supposed to go to.She is advised to follow-up with me on an as-needed basis.  She is encouraged to call our office if she is ready to proceed with a sleep study, she can call or email anytime.  I answered all their questions today and the patient and her daughter were in agreement.

## 2018-12-06 DIAGNOSIS — G894 Chronic pain syndrome: Secondary | ICD-10-CM | POA: Diagnosis not present

## 2018-12-06 DIAGNOSIS — Z79899 Other long term (current) drug therapy: Secondary | ICD-10-CM | POA: Diagnosis not present

## 2018-12-06 DIAGNOSIS — Z79891 Long term (current) use of opiate analgesic: Secondary | ICD-10-CM | POA: Diagnosis not present

## 2018-12-06 NOTE — Addendum Note (Signed)
Addended by: Lester [place] A on: 12/06/2018 02:13 PM   Modules accepted: Orders

## 2018-12-24 ENCOUNTER — Ambulatory Visit (HOSPITAL_COMMUNITY): Payer: Medicare Other | Attending: Neurology

## 2018-12-25 NOTE — Addendum Note (Signed)
Addended by: Lester [place] A on: 12/25/2018 07:12 AM   Modules accepted: Orders

## 2019-01-08 DIAGNOSIS — G4700 Insomnia, unspecified: Secondary | ICD-10-CM | POA: Diagnosis not present

## 2019-01-08 DIAGNOSIS — Z79891 Long term (current) use of opiate analgesic: Secondary | ICD-10-CM | POA: Diagnosis not present

## 2019-01-08 DIAGNOSIS — I10 Essential (primary) hypertension: Secondary | ICD-10-CM | POA: Diagnosis not present

## 2019-01-08 DIAGNOSIS — G894 Chronic pain syndrome: Secondary | ICD-10-CM | POA: Diagnosis not present

## 2019-01-16 ENCOUNTER — Other Ambulatory Visit: Payer: Self-pay

## 2019-01-16 ENCOUNTER — Ambulatory Visit
Admission: RE | Admit: 2019-01-16 | Discharge: 2019-01-16 | Disposition: A | Discharge status: Home / Self Care | Payer: Medicare Other | Source: Ambulatory Visit | Attending: Neurology | Admitting: Neurology

## 2019-01-16 DIAGNOSIS — Z8679 Personal history of other diseases of the circulatory system: Secondary | ICD-10-CM | POA: Diagnosis not present

## 2019-01-16 DIAGNOSIS — I619 Nontraumatic intracerebral hemorrhage, unspecified: Secondary | ICD-10-CM

## 2019-01-16 DIAGNOSIS — F17200 Nicotine dependence, unspecified, uncomplicated: Secondary | ICD-10-CM

## 2019-01-16 DIAGNOSIS — Z8673 Personal history of transient ischemic attack (TIA), and cerebral infarction without residual deficits: Secondary | ICD-10-CM

## 2019-01-21 ENCOUNTER — Telehealth: Payer: Self-pay

## 2019-01-21 NOTE — Progress Notes (Signed)
Brain MRI showed findings in keeping with the prior hemorrhages, chronic findings, no acute changes. Post-surgical changes were noted from her brain surgery from the bleed.  She can FU with PCP. I had suggested eval for sleep apnea concern. Let me know, if she would like to schedule the study. I can put order in. [place]

## 2019-01-21 NOTE — Telephone Encounter (Signed)
-----   Message from Star Age, MD sent at 01/21/2019  8:08 AM EDT ----- Brain MRI showed findings in keeping with the prior hemorrhages, chronic findings, no acute changes. Post-surgical changes were noted from her brain surgery from the bleed.  She can FU with PCP. I had suggested eval for sleep apnea concern. Let me know, if she would like to schedule the study. I can put order in. [place]

## 2019-01-21 NOTE — Telephone Encounter (Signed)
I called pt and discussed her MRI results and recommendations with her. She does NOT want to complete a sleep study at this time. Pt verbalized understanding of results. Pt had no questions at this time but was encouraged to call back if questions arise.

## 2019-02-06 DIAGNOSIS — G894 Chronic pain syndrome: Secondary | ICD-10-CM | POA: Diagnosis not present

## 2019-02-06 DIAGNOSIS — Z79891 Long term (current) use of opiate analgesic: Secondary | ICD-10-CM | POA: Diagnosis not present

## 2019-02-06 DIAGNOSIS — Z79899 Other long term (current) drug therapy: Secondary | ICD-10-CM | POA: Diagnosis not present

## 2019-02-06 DIAGNOSIS — I10 Essential (primary) hypertension: Secondary | ICD-10-CM | POA: Diagnosis not present

## 2019-03-06 DIAGNOSIS — G894 Chronic pain syndrome: Secondary | ICD-10-CM | POA: Diagnosis not present

## 2019-03-06 DIAGNOSIS — Z79899 Other long term (current) drug therapy: Secondary | ICD-10-CM | POA: Diagnosis not present

## 2019-03-06 DIAGNOSIS — Z79891 Long term (current) use of opiate analgesic: Secondary | ICD-10-CM | POA: Diagnosis not present

## 2019-04-04 DIAGNOSIS — Z79891 Long term (current) use of opiate analgesic: Secondary | ICD-10-CM | POA: Diagnosis not present

## 2019-04-04 DIAGNOSIS — Z79899 Other long term (current) drug therapy: Secondary | ICD-10-CM | POA: Diagnosis not present

## 2019-04-04 DIAGNOSIS — G894 Chronic pain syndrome: Secondary | ICD-10-CM | POA: Diagnosis not present

## 2019-05-06 DIAGNOSIS — Z79899 Other long term (current) drug therapy: Secondary | ICD-10-CM | POA: Diagnosis not present

## 2019-06-04 DIAGNOSIS — Z79891 Long term (current) use of opiate analgesic: Secondary | ICD-10-CM | POA: Diagnosis not present

## 2019-06-04 DIAGNOSIS — Z79899 Other long term (current) drug therapy: Secondary | ICD-10-CM | POA: Diagnosis not present

## 2019-06-04 DIAGNOSIS — E669 Obesity, unspecified: Secondary | ICD-10-CM | POA: Diagnosis not present

## 2019-06-04 DIAGNOSIS — G894 Chronic pain syndrome: Secondary | ICD-10-CM | POA: Diagnosis not present

## 2019-07-03 DIAGNOSIS — G894 Chronic pain syndrome: Secondary | ICD-10-CM | POA: Diagnosis not present

## 2019-07-03 DIAGNOSIS — M25561 Pain in right knee: Secondary | ICD-10-CM | POA: Diagnosis not present

## 2019-07-03 DIAGNOSIS — Z79899 Other long term (current) drug therapy: Secondary | ICD-10-CM | POA: Diagnosis not present

## 2019-07-03 DIAGNOSIS — Z79891 Long term (current) use of opiate analgesic: Secondary | ICD-10-CM | POA: Diagnosis not present

## 2019-07-03 DIAGNOSIS — M25661 Stiffness of right knee, not elsewhere classified: Secondary | ICD-10-CM | POA: Diagnosis not present

## 2019-07-10 ENCOUNTER — Other Ambulatory Visit: Payer: Self-pay | Admitting: Family Medicine

## 2019-07-10 ENCOUNTER — Other Ambulatory Visit: Payer: Self-pay

## 2019-07-10 ENCOUNTER — Ambulatory Visit
Admission: RE | Admit: 2019-07-10 | Discharge: 2019-07-10 | Disposition: A | Discharge status: Home / Self Care | Payer: Medicare Other | Source: Ambulatory Visit | Attending: Family Medicine | Admitting: Family Medicine

## 2019-07-10 DIAGNOSIS — M25661 Stiffness of right knee, not elsewhere classified: Secondary | ICD-10-CM

## 2019-07-10 DIAGNOSIS — M25561 Pain in right knee: Secondary | ICD-10-CM | POA: Diagnosis not present

## 2019-07-31 DIAGNOSIS — E669 Obesity, unspecified: Secondary | ICD-10-CM | POA: Diagnosis not present

## 2019-07-31 DIAGNOSIS — Z79891 Long term (current) use of opiate analgesic: Secondary | ICD-10-CM | POA: Diagnosis not present

## 2019-07-31 DIAGNOSIS — M25561 Pain in right knee: Secondary | ICD-10-CM | POA: Diagnosis not present

## 2019-07-31 DIAGNOSIS — M25661 Stiffness of right knee, not elsewhere classified: Secondary | ICD-10-CM | POA: Diagnosis not present

## 2019-07-31 DIAGNOSIS — G894 Chronic pain syndrome: Secondary | ICD-10-CM | POA: Diagnosis not present

## 2019-07-31 DIAGNOSIS — Z79899 Other long term (current) drug therapy: Secondary | ICD-10-CM | POA: Diagnosis not present

## 2019-08-29 DIAGNOSIS — Z79891 Long term (current) use of opiate analgesic: Secondary | ICD-10-CM | POA: Diagnosis not present

## 2019-08-29 DIAGNOSIS — G894 Chronic pain syndrome: Secondary | ICD-10-CM | POA: Diagnosis not present

## 2019-08-29 DIAGNOSIS — Z79899 Other long term (current) drug therapy: Secondary | ICD-10-CM | POA: Diagnosis not present

## 2019-08-29 DIAGNOSIS — F419 Anxiety disorder, unspecified: Secondary | ICD-10-CM | POA: Diagnosis not present

## 2019-10-03 DIAGNOSIS — F419 Anxiety disorder, unspecified: Secondary | ICD-10-CM | POA: Diagnosis not present

## 2019-10-03 DIAGNOSIS — G894 Chronic pain syndrome: Secondary | ICD-10-CM | POA: Diagnosis not present

## 2019-10-03 DIAGNOSIS — M519 Unspecified thoracic, thoracolumbar and lumbosacral intervertebral disc disorder: Secondary | ICD-10-CM | POA: Diagnosis not present

## 2019-10-03 DIAGNOSIS — Z79891 Long term (current) use of opiate analgesic: Secondary | ICD-10-CM | POA: Diagnosis not present

## 2019-12-01 DEATH — deceased

## 2020-08-06 IMAGING — CR DG KNEE COMPLETE 4+V*R*
4 series · 4 of 4 positions shown · non-contrast
Comparison: None.

CLINICAL DATA: Chronic right knee pain.

EXAM:
RIGHT KNEE - COMPLETE 4+ VIEW

[w knee ap right (1 of 3)]
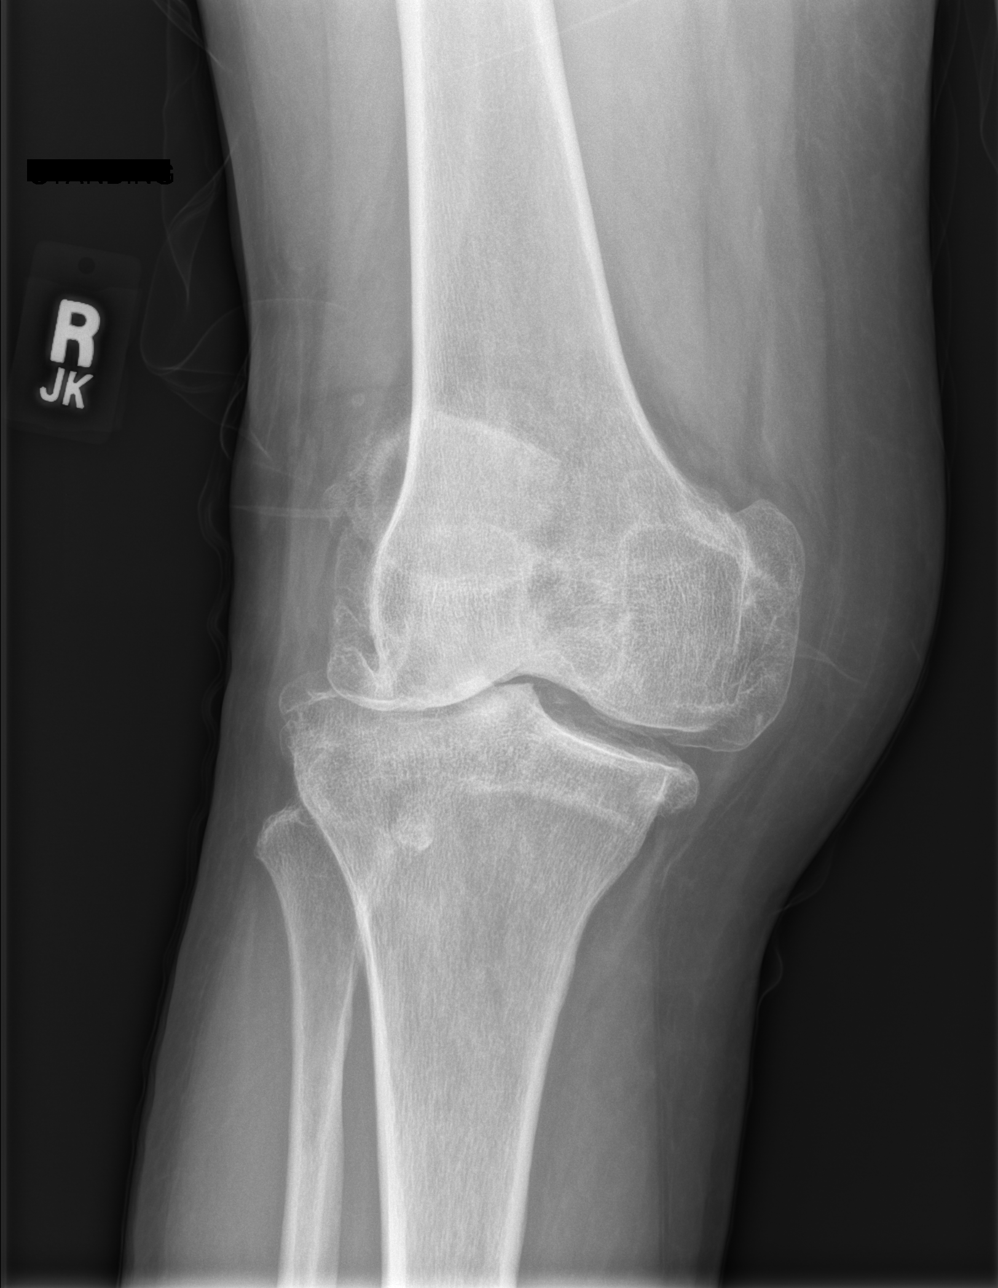

[w knee ap right (2 of 3)]
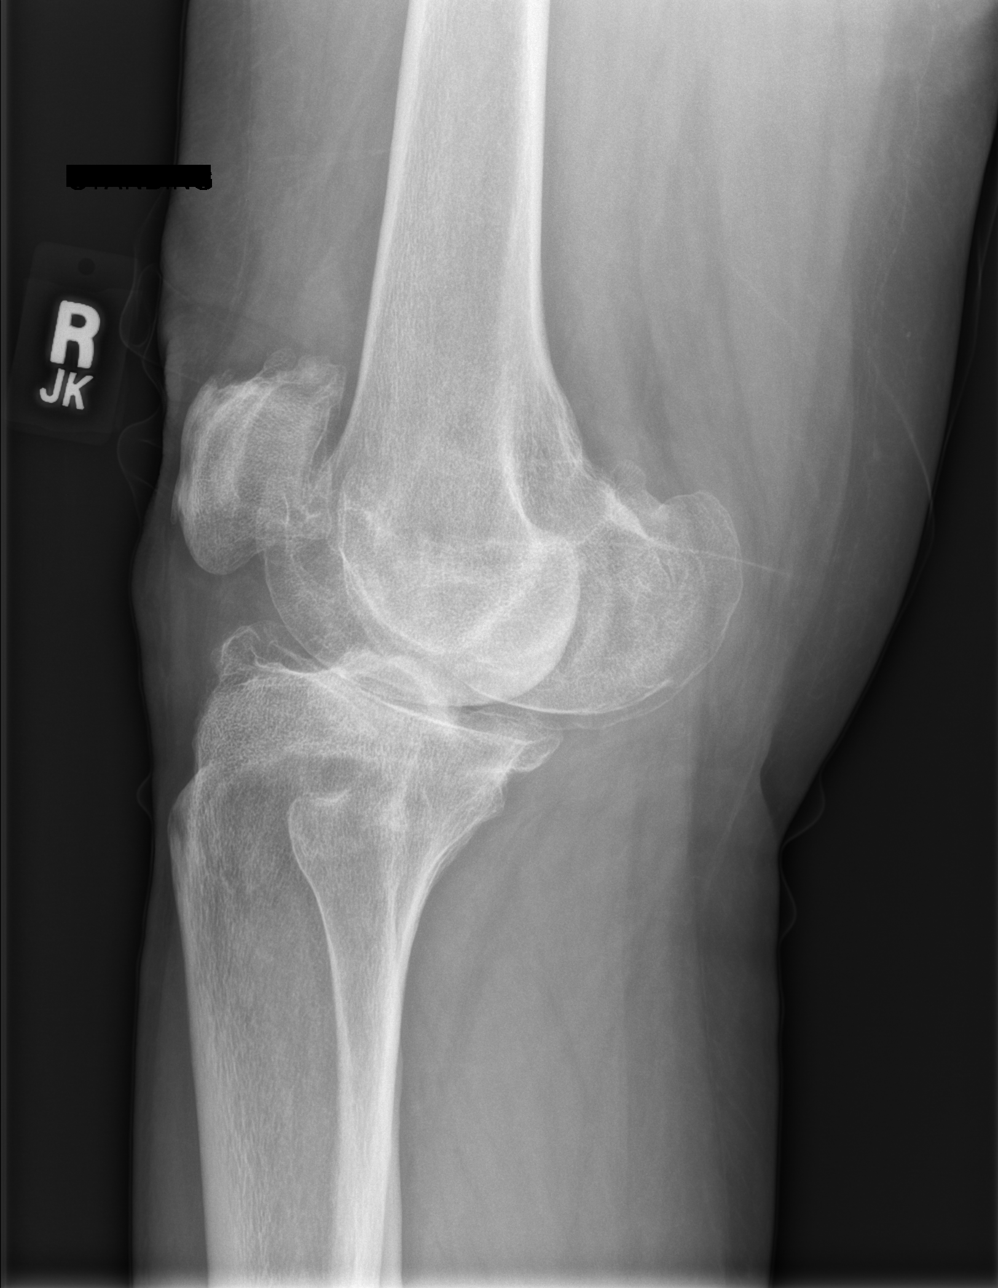

[w knee ap right (3 of 3)]
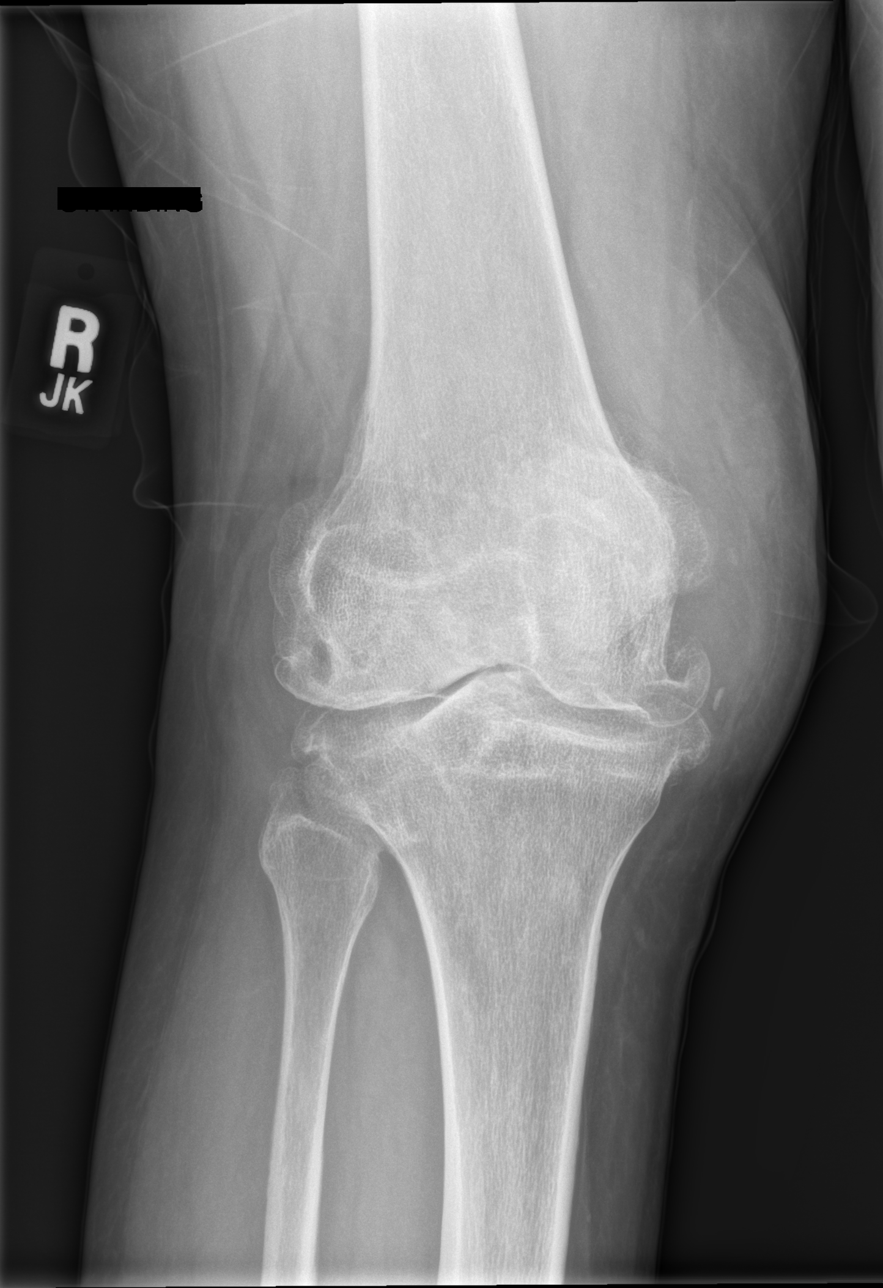

[w knee lat right]
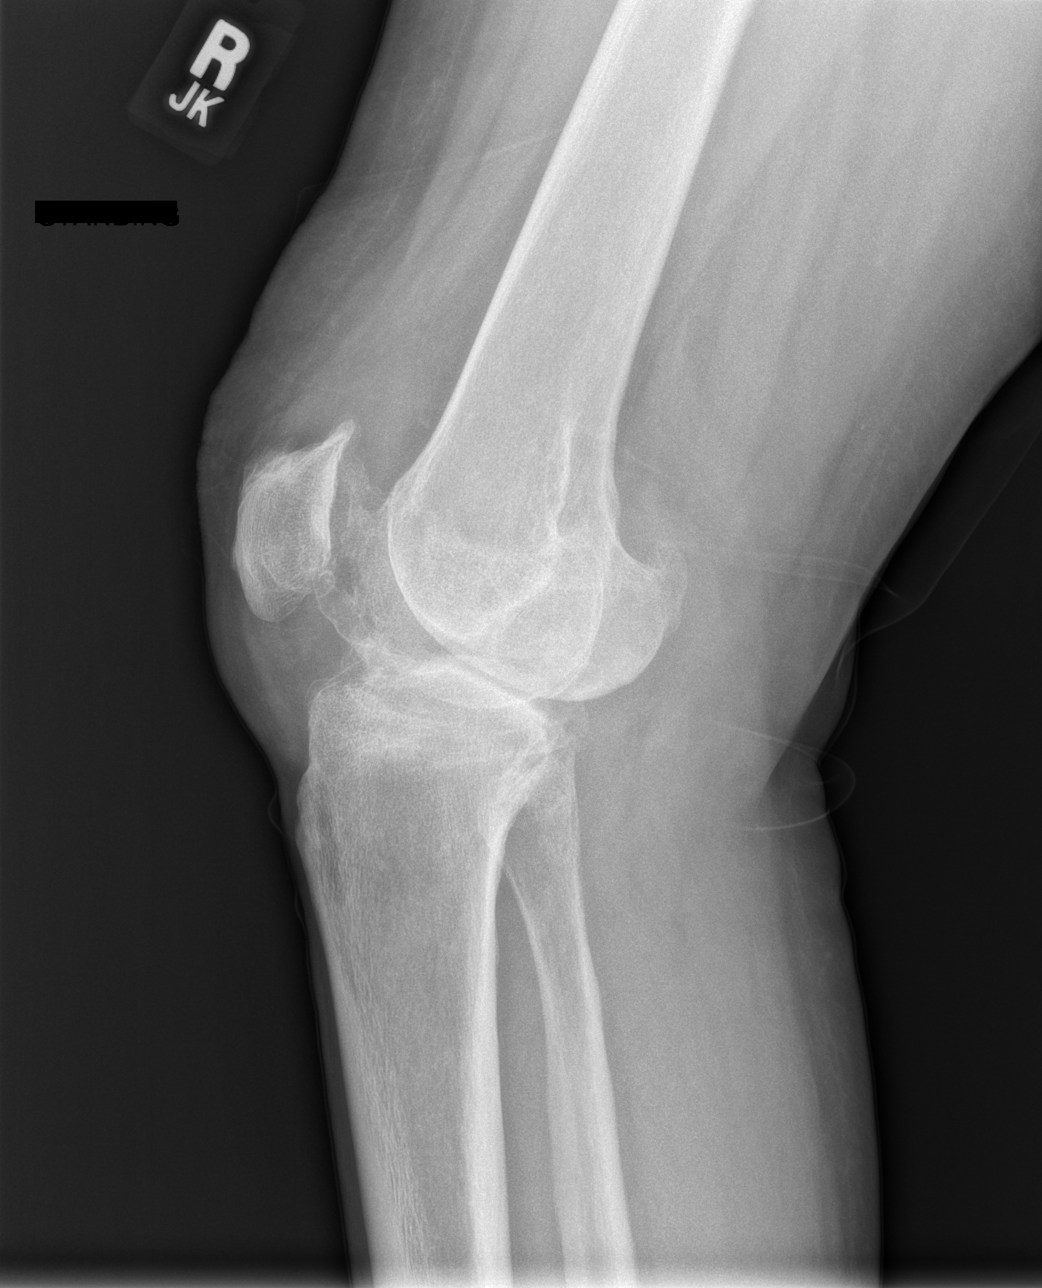

[4 of 4 positions shown; findings below may reference images not displayed]

FINDINGS: No evidence for an acute fracture. No subluxation or dislocation.
Loss of joint space noted medial and lateral compartments with
robust hypertrophic spurring in all 3 compartments. Small joint
effusion evident.
IMPRESSION: Fairly marked tricompartmental degenerative disease, progressive
since 10/04/2015.
